# Patient Record
Sex: Male | Born: 1945
Health system: Southern US, Community
[De-identification: ages and names within clinical notes are randomized; demographics above are authoritative.]

## PROBLEM LIST (undated history)

## (undated) DIAGNOSIS — K219 Gastro-esophageal reflux disease without esophagitis: Secondary | ICD-10-CM

## (undated) DIAGNOSIS — Z9889 Other specified postprocedural states: Secondary | ICD-10-CM

## (undated) DIAGNOSIS — F32A Depression, unspecified: Secondary | ICD-10-CM

## (undated) DIAGNOSIS — H9319 Tinnitus, unspecified ear: Secondary | ICD-10-CM

## (undated) DIAGNOSIS — K7460 Unspecified cirrhosis of liver: Secondary | ICD-10-CM

## (undated) DIAGNOSIS — K589 Irritable bowel syndrome without diarrhea: Secondary | ICD-10-CM

## (undated) DIAGNOSIS — N189 Chronic kidney disease, unspecified: Secondary | ICD-10-CM

## (undated) DIAGNOSIS — R188 Other ascites: Secondary | ICD-10-CM

## (undated) DIAGNOSIS — K5730 Diverticulosis of large intestine without perforation or abscess without bleeding: Secondary | ICD-10-CM

## (undated) DIAGNOSIS — R112 Nausea with vomiting, unspecified: Secondary | ICD-10-CM

## (undated) DIAGNOSIS — I10 Essential (primary) hypertension: Secondary | ICD-10-CM

## (undated) DIAGNOSIS — I2510 Atherosclerotic heart disease of native coronary artery without angina pectoris: Secondary | ICD-10-CM

## (undated) DIAGNOSIS — E119 Type 2 diabetes mellitus without complications: Secondary | ICD-10-CM

## (undated) DIAGNOSIS — T8859XA Other complications of anesthesia, initial encounter: Secondary | ICD-10-CM

## (undated) DIAGNOSIS — K625 Hemorrhage of anus and rectum: Secondary | ICD-10-CM

## (undated) DIAGNOSIS — E782 Mixed hyperlipidemia: Secondary | ICD-10-CM

## (undated) DIAGNOSIS — R002 Palpitations: Secondary | ICD-10-CM

## (undated) HISTORY — DX: Essential (primary) hypertension: I10

## (undated) HISTORY — PX: CORONARY ANGIOPLASTY WITH STENT PLACEMENT: SHX49

## (undated) HISTORY — DX: Hemorrhage of anus and rectum: K62.5

## (undated) HISTORY — DX: Diverticulosis of large intestine without perforation or abscess without bleeding: K57.30

## (undated) HISTORY — PX: KNEE SURGERY: SHX244

## (undated) HISTORY — DX: Mixed hyperlipidemia: E78.2

## (undated) HISTORY — DX: Atherosclerotic heart disease of native coronary artery without angina pectoris: I25.10

## (undated) HISTORY — PX: BACK SURGERY: SHX140

## (undated) HISTORY — DX: Type 2 diabetes mellitus without complications: E11.9

## (undated) HISTORY — PX: CHOLECYSTECTOMY: SHX55

## (undated) HISTORY — PX: CARDIAC CATHETERIZATION: SHX172

---

## 2012-01-01 DIAGNOSIS — H1045 Other chronic allergic conjunctivitis: Secondary | ICD-10-CM | POA: Diagnosis not present

## 2012-01-01 DIAGNOSIS — E119 Type 2 diabetes mellitus without complications: Secondary | ICD-10-CM | POA: Diagnosis not present

## 2012-01-01 DIAGNOSIS — H2589 Other age-related cataract: Secondary | ICD-10-CM | POA: Diagnosis not present

## 2012-01-01 DIAGNOSIS — H04129 Dry eye syndrome of unspecified lacrimal gland: Secondary | ICD-10-CM | POA: Diagnosis not present

## 2012-05-06 DIAGNOSIS — R079 Chest pain, unspecified: Secondary | ICD-10-CM | POA: Diagnosis not present

## 2012-05-06 DIAGNOSIS — I2510 Atherosclerotic heart disease of native coronary artery without angina pectoris: Secondary | ICD-10-CM | POA: Diagnosis not present

## 2012-05-06 DIAGNOSIS — I200 Unstable angina: Secondary | ICD-10-CM | POA: Diagnosis not present

## 2012-05-06 DIAGNOSIS — E119 Type 2 diabetes mellitus without complications: Secondary | ICD-10-CM | POA: Diagnosis not present

## 2012-05-06 DIAGNOSIS — R0602 Shortness of breath: Secondary | ICD-10-CM | POA: Diagnosis not present

## 2012-05-06 DIAGNOSIS — Z9089 Acquired absence of other organs: Secondary | ICD-10-CM | POA: Diagnosis not present

## 2012-05-06 DIAGNOSIS — Z79899 Other long term (current) drug therapy: Secondary | ICD-10-CM | POA: Diagnosis not present

## 2012-05-06 DIAGNOSIS — Z7982 Long term (current) use of aspirin: Secondary | ICD-10-CM | POA: Diagnosis not present

## 2012-05-06 DIAGNOSIS — M79609 Pain in unspecified limb: Secondary | ICD-10-CM | POA: Diagnosis not present

## 2012-05-06 DIAGNOSIS — R072 Precordial pain: Secondary | ICD-10-CM | POA: Diagnosis not present

## 2012-05-06 DIAGNOSIS — Z884 Allergy status to anesthetic agent status: Secondary | ICD-10-CM | POA: Diagnosis not present

## 2012-05-06 DIAGNOSIS — J45909 Unspecified asthma, uncomplicated: Secondary | ICD-10-CM | POA: Diagnosis present

## 2012-05-06 DIAGNOSIS — I10 Essential (primary) hypertension: Secondary | ICD-10-CM | POA: Diagnosis not present

## 2012-05-06 DIAGNOSIS — I519 Heart disease, unspecified: Secondary | ICD-10-CM | POA: Diagnosis not present

## 2012-07-20 DIAGNOSIS — Z23 Encounter for immunization: Secondary | ICD-10-CM | POA: Diagnosis not present

## 2012-07-29 DIAGNOSIS — I10 Essential (primary) hypertension: Secondary | ICD-10-CM | POA: Diagnosis not present

## 2012-07-29 DIAGNOSIS — Z79899 Other long term (current) drug therapy: Secondary | ICD-10-CM | POA: Diagnosis not present

## 2012-07-29 DIAGNOSIS — Z125 Encounter for screening for malignant neoplasm of prostate: Secondary | ICD-10-CM | POA: Diagnosis not present

## 2012-07-29 DIAGNOSIS — K219 Gastro-esophageal reflux disease without esophagitis: Secondary | ICD-10-CM | POA: Diagnosis not present

## 2012-07-29 DIAGNOSIS — E119 Type 2 diabetes mellitus without complications: Secondary | ICD-10-CM | POA: Diagnosis not present

## 2012-08-13 DIAGNOSIS — E119 Type 2 diabetes mellitus without complications: Secondary | ICD-10-CM | POA: Diagnosis not present

## 2012-08-13 DIAGNOSIS — R079 Chest pain, unspecified: Secondary | ICD-10-CM | POA: Diagnosis not present

## 2012-08-13 DIAGNOSIS — I2510 Atherosclerotic heart disease of native coronary artery without angina pectoris: Secondary | ICD-10-CM | POA: Diagnosis not present

## 2012-08-13 DIAGNOSIS — I200 Unstable angina: Secondary | ICD-10-CM | POA: Diagnosis not present

## 2012-08-13 DIAGNOSIS — I10 Essential (primary) hypertension: Secondary | ICD-10-CM | POA: Diagnosis not present

## 2012-08-13 DIAGNOSIS — I209 Angina pectoris, unspecified: Secondary | ICD-10-CM | POA: Diagnosis not present

## 2012-08-13 DIAGNOSIS — K222 Esophageal obstruction: Secondary | ICD-10-CM | POA: Diagnosis not present

## 2012-08-23 DIAGNOSIS — I2510 Atherosclerotic heart disease of native coronary artery without angina pectoris: Secondary | ICD-10-CM | POA: Diagnosis not present

## 2013-04-20 DIAGNOSIS — E119 Type 2 diabetes mellitus without complications: Secondary | ICD-10-CM | POA: Diagnosis not present

## 2013-05-27 DIAGNOSIS — M531 Cervicobrachial syndrome: Secondary | ICD-10-CM | POA: Diagnosis not present

## 2013-05-27 DIAGNOSIS — M999 Biomechanical lesion, unspecified: Secondary | ICD-10-CM | POA: Diagnosis not present

## 2013-05-27 DIAGNOSIS — M9981 Other biomechanical lesions of cervical region: Secondary | ICD-10-CM | POA: Diagnosis not present

## 2013-05-30 DIAGNOSIS — M999 Biomechanical lesion, unspecified: Secondary | ICD-10-CM | POA: Diagnosis not present

## 2013-05-30 DIAGNOSIS — M531 Cervicobrachial syndrome: Secondary | ICD-10-CM | POA: Diagnosis not present

## 2013-05-30 DIAGNOSIS — M9981 Other biomechanical lesions of cervical region: Secondary | ICD-10-CM | POA: Diagnosis not present

## 2013-06-01 DIAGNOSIS — M999 Biomechanical lesion, unspecified: Secondary | ICD-10-CM | POA: Diagnosis not present

## 2013-06-01 DIAGNOSIS — M9981 Other biomechanical lesions of cervical region: Secondary | ICD-10-CM | POA: Diagnosis not present

## 2013-06-01 DIAGNOSIS — M531 Cervicobrachial syndrome: Secondary | ICD-10-CM | POA: Diagnosis not present

## 2013-06-03 DIAGNOSIS — M9981 Other biomechanical lesions of cervical region: Secondary | ICD-10-CM | POA: Diagnosis not present

## 2013-06-03 DIAGNOSIS — M531 Cervicobrachial syndrome: Secondary | ICD-10-CM | POA: Diagnosis not present

## 2013-06-03 DIAGNOSIS — M999 Biomechanical lesion, unspecified: Secondary | ICD-10-CM | POA: Diagnosis not present

## 2013-06-08 DIAGNOSIS — M999 Biomechanical lesion, unspecified: Secondary | ICD-10-CM | POA: Diagnosis not present

## 2013-06-08 DIAGNOSIS — M531 Cervicobrachial syndrome: Secondary | ICD-10-CM | POA: Diagnosis not present

## 2013-06-08 DIAGNOSIS — M9981 Other biomechanical lesions of cervical region: Secondary | ICD-10-CM | POA: Diagnosis not present

## 2013-07-05 DIAGNOSIS — I2510 Atherosclerotic heart disease of native coronary artery without angina pectoris: Secondary | ICD-10-CM | POA: Diagnosis not present

## 2013-07-05 DIAGNOSIS — E119 Type 2 diabetes mellitus without complications: Secondary | ICD-10-CM | POA: Diagnosis not present

## 2013-07-05 DIAGNOSIS — I10 Essential (primary) hypertension: Secondary | ICD-10-CM | POA: Diagnosis not present

## 2013-07-05 DIAGNOSIS — E785 Hyperlipidemia, unspecified: Secondary | ICD-10-CM | POA: Diagnosis not present

## 2013-07-22 DIAGNOSIS — R079 Chest pain, unspecified: Secondary | ICD-10-CM | POA: Diagnosis not present

## 2013-07-22 DIAGNOSIS — I2510 Atherosclerotic heart disease of native coronary artery without angina pectoris: Secondary | ICD-10-CM | POA: Diagnosis not present

## 2013-08-08 DIAGNOSIS — E119 Type 2 diabetes mellitus without complications: Secondary | ICD-10-CM | POA: Diagnosis not present

## 2013-08-08 DIAGNOSIS — I10 Essential (primary) hypertension: Secondary | ICD-10-CM | POA: Diagnosis not present

## 2013-08-08 DIAGNOSIS — Z125 Encounter for screening for malignant neoplasm of prostate: Secondary | ICD-10-CM | POA: Diagnosis not present

## 2013-08-08 DIAGNOSIS — Z79899 Other long term (current) drug therapy: Secondary | ICD-10-CM | POA: Diagnosis not present

## 2013-08-08 DIAGNOSIS — Z23 Encounter for immunization: Secondary | ICD-10-CM | POA: Diagnosis not present

## 2013-08-08 DIAGNOSIS — Z0000 Encounter for general adult medical examination without abnormal findings: Secondary | ICD-10-CM | POA: Diagnosis not present

## 2013-08-08 DIAGNOSIS — I2510 Atherosclerotic heart disease of native coronary artery without angina pectoris: Secondary | ICD-10-CM | POA: Diagnosis not present

## 2013-08-08 DIAGNOSIS — E782 Mixed hyperlipidemia: Secondary | ICD-10-CM | POA: Diagnosis not present

## 2013-08-10 DIAGNOSIS — I2510 Atherosclerotic heart disease of native coronary artery without angina pectoris: Secondary | ICD-10-CM | POA: Diagnosis not present

## 2013-09-14 DIAGNOSIS — E119 Type 2 diabetes mellitus without complications: Secondary | ICD-10-CM | POA: Diagnosis not present

## 2013-09-14 DIAGNOSIS — I10 Essential (primary) hypertension: Secondary | ICD-10-CM | POA: Diagnosis not present

## 2013-09-14 DIAGNOSIS — E785 Hyperlipidemia, unspecified: Secondary | ICD-10-CM | POA: Diagnosis not present

## 2013-09-14 DIAGNOSIS — I2510 Atherosclerotic heart disease of native coronary artery without angina pectoris: Secondary | ICD-10-CM | POA: Diagnosis not present

## 2013-09-30 DIAGNOSIS — E782 Mixed hyperlipidemia: Secondary | ICD-10-CM | POA: Diagnosis not present

## 2013-09-30 DIAGNOSIS — IMO0001 Reserved for inherently not codable concepts without codable children: Secondary | ICD-10-CM | POA: Diagnosis not present

## 2013-10-04 DIAGNOSIS — E782 Mixed hyperlipidemia: Secondary | ICD-10-CM | POA: Diagnosis not present

## 2013-10-04 DIAGNOSIS — I2510 Atherosclerotic heart disease of native coronary artery without angina pectoris: Secondary | ICD-10-CM | POA: Diagnosis not present

## 2013-10-04 DIAGNOSIS — Z7902 Long term (current) use of antithrombotics/antiplatelets: Secondary | ICD-10-CM | POA: Diagnosis not present

## 2013-10-04 DIAGNOSIS — Z006 Encounter for examination for normal comparison and control in clinical research program: Secondary | ICD-10-CM | POA: Diagnosis not present

## 2013-10-13 DIAGNOSIS — E785 Hyperlipidemia, unspecified: Secondary | ICD-10-CM | POA: Diagnosis not present

## 2013-10-19 DIAGNOSIS — J0100 Acute maxillary sinusitis, unspecified: Secondary | ICD-10-CM | POA: Diagnosis not present

## 2014-03-22 DIAGNOSIS — E119 Type 2 diabetes mellitus without complications: Secondary | ICD-10-CM | POA: Diagnosis not present

## 2014-03-22 DIAGNOSIS — E785 Hyperlipidemia, unspecified: Secondary | ICD-10-CM | POA: Diagnosis not present

## 2014-03-22 DIAGNOSIS — I2510 Atherosclerotic heart disease of native coronary artery without angina pectoris: Secondary | ICD-10-CM | POA: Diagnosis not present

## 2014-03-22 DIAGNOSIS — I10 Essential (primary) hypertension: Secondary | ICD-10-CM | POA: Diagnosis not present

## 2014-06-07 DIAGNOSIS — E119 Type 2 diabetes mellitus without complications: Secondary | ICD-10-CM | POA: Diagnosis not present

## 2014-07-11 DIAGNOSIS — Z23 Encounter for immunization: Secondary | ICD-10-CM | POA: Diagnosis not present

## 2014-08-09 DIAGNOSIS — I2510 Atherosclerotic heart disease of native coronary artery without angina pectoris: Secondary | ICD-10-CM | POA: Diagnosis not present

## 2014-08-09 DIAGNOSIS — E785 Hyperlipidemia, unspecified: Secondary | ICD-10-CM | POA: Diagnosis not present

## 2014-08-09 DIAGNOSIS — Z1389 Encounter for screening for other disorder: Secondary | ICD-10-CM | POA: Diagnosis not present

## 2014-08-09 DIAGNOSIS — K219 Gastro-esophageal reflux disease without esophagitis: Secondary | ICD-10-CM | POA: Diagnosis not present

## 2014-08-09 DIAGNOSIS — E1159 Type 2 diabetes mellitus with other circulatory complications: Secondary | ICD-10-CM | POA: Diagnosis not present

## 2014-08-09 DIAGNOSIS — Z9181 History of falling: Secondary | ICD-10-CM | POA: Diagnosis not present

## 2014-08-09 DIAGNOSIS — J309 Allergic rhinitis, unspecified: Secondary | ICD-10-CM | POA: Diagnosis not present

## 2014-08-09 DIAGNOSIS — I10 Essential (primary) hypertension: Secondary | ICD-10-CM | POA: Diagnosis not present

## 2014-08-09 DIAGNOSIS — R5382 Chronic fatigue, unspecified: Secondary | ICD-10-CM | POA: Diagnosis not present

## 2014-08-25 DIAGNOSIS — R945 Abnormal results of liver function studies: Secondary | ICD-10-CM | POA: Diagnosis not present

## 2014-08-25 DIAGNOSIS — K219 Gastro-esophageal reflux disease without esophagitis: Secondary | ICD-10-CM | POA: Diagnosis not present

## 2014-08-25 DIAGNOSIS — E785 Hyperlipidemia, unspecified: Secondary | ICD-10-CM | POA: Diagnosis not present

## 2014-08-25 DIAGNOSIS — J309 Allergic rhinitis, unspecified: Secondary | ICD-10-CM | POA: Diagnosis not present

## 2014-08-25 DIAGNOSIS — N183 Chronic kidney disease, stage 3 (moderate): Secondary | ICD-10-CM | POA: Diagnosis not present

## 2014-08-25 DIAGNOSIS — E669 Obesity, unspecified: Secondary | ICD-10-CM | POA: Diagnosis not present

## 2014-08-25 DIAGNOSIS — E1159 Type 2 diabetes mellitus with other circulatory complications: Secondary | ICD-10-CM | POA: Diagnosis not present

## 2014-08-25 DIAGNOSIS — I10 Essential (primary) hypertension: Secondary | ICD-10-CM | POA: Diagnosis not present

## 2014-08-25 DIAGNOSIS — I2510 Atherosclerotic heart disease of native coronary artery without angina pectoris: Secondary | ICD-10-CM | POA: Diagnosis not present

## 2014-10-20 DIAGNOSIS — R945 Abnormal results of liver function studies: Secondary | ICD-10-CM | POA: Diagnosis not present

## 2014-10-20 DIAGNOSIS — J309 Allergic rhinitis, unspecified: Secondary | ICD-10-CM | POA: Diagnosis not present

## 2014-10-20 DIAGNOSIS — K219 Gastro-esophageal reflux disease without esophagitis: Secondary | ICD-10-CM | POA: Diagnosis not present

## 2014-10-20 DIAGNOSIS — E785 Hyperlipidemia, unspecified: Secondary | ICD-10-CM | POA: Diagnosis not present

## 2014-10-20 DIAGNOSIS — E1159 Type 2 diabetes mellitus with other circulatory complications: Secondary | ICD-10-CM | POA: Diagnosis not present

## 2014-10-20 DIAGNOSIS — I2510 Atherosclerotic heart disease of native coronary artery without angina pectoris: Secondary | ICD-10-CM | POA: Diagnosis not present

## 2014-10-20 DIAGNOSIS — I10 Essential (primary) hypertension: Secondary | ICD-10-CM | POA: Diagnosis not present

## 2014-10-20 DIAGNOSIS — N183 Chronic kidney disease, stage 3 (moderate): Secondary | ICD-10-CM | POA: Diagnosis not present

## 2014-11-07 DIAGNOSIS — J189 Pneumonia, unspecified organism: Secondary | ICD-10-CM | POA: Diagnosis not present

## 2015-04-26 DIAGNOSIS — R945 Abnormal results of liver function studies: Secondary | ICD-10-CM | POA: Diagnosis not present

## 2015-04-26 DIAGNOSIS — E785 Hyperlipidemia, unspecified: Secondary | ICD-10-CM | POA: Diagnosis not present

## 2015-04-26 DIAGNOSIS — E669 Obesity, unspecified: Secondary | ICD-10-CM | POA: Diagnosis not present

## 2015-04-26 DIAGNOSIS — K219 Gastro-esophageal reflux disease without esophagitis: Secondary | ICD-10-CM | POA: Diagnosis not present

## 2015-04-26 DIAGNOSIS — I2510 Atherosclerotic heart disease of native coronary artery without angina pectoris: Secondary | ICD-10-CM | POA: Diagnosis not present

## 2015-04-26 DIAGNOSIS — Z6831 Body mass index (BMI) 31.0-31.9, adult: Secondary | ICD-10-CM | POA: Diagnosis not present

## 2015-04-26 DIAGNOSIS — N183 Chronic kidney disease, stage 3 (moderate): Secondary | ICD-10-CM | POA: Diagnosis not present

## 2015-04-26 DIAGNOSIS — I10 Essential (primary) hypertension: Secondary | ICD-10-CM | POA: Diagnosis not present

## 2015-04-26 DIAGNOSIS — J309 Allergic rhinitis, unspecified: Secondary | ICD-10-CM | POA: Diagnosis not present

## 2015-04-26 DIAGNOSIS — E1159 Type 2 diabetes mellitus with other circulatory complications: Secondary | ICD-10-CM | POA: Diagnosis not present

## 2015-04-26 DIAGNOSIS — Z1389 Encounter for screening for other disorder: Secondary | ICD-10-CM | POA: Diagnosis not present

## 2015-05-11 DIAGNOSIS — K219 Gastro-esophageal reflux disease without esophagitis: Secondary | ICD-10-CM | POA: Diagnosis not present

## 2015-05-11 DIAGNOSIS — Z6831 Body mass index (BMI) 31.0-31.9, adult: Secondary | ICD-10-CM | POA: Diagnosis not present

## 2015-05-11 DIAGNOSIS — I10 Essential (primary) hypertension: Secondary | ICD-10-CM | POA: Diagnosis not present

## 2015-05-11 DIAGNOSIS — E669 Obesity, unspecified: Secondary | ICD-10-CM | POA: Diagnosis not present

## 2015-05-11 DIAGNOSIS — I2510 Atherosclerotic heart disease of native coronary artery without angina pectoris: Secondary | ICD-10-CM | POA: Diagnosis not present

## 2015-05-11 DIAGNOSIS — E1159 Type 2 diabetes mellitus with other circulatory complications: Secondary | ICD-10-CM | POA: Diagnosis not present

## 2015-05-11 DIAGNOSIS — R945 Abnormal results of liver function studies: Secondary | ICD-10-CM | POA: Diagnosis not present

## 2015-05-11 DIAGNOSIS — Z23 Encounter for immunization: Secondary | ICD-10-CM | POA: Diagnosis not present

## 2015-05-11 DIAGNOSIS — N183 Chronic kidney disease, stage 3 (moderate): Secondary | ICD-10-CM | POA: Diagnosis not present

## 2015-05-11 DIAGNOSIS — E785 Hyperlipidemia, unspecified: Secondary | ICD-10-CM | POA: Diagnosis not present

## 2015-05-11 DIAGNOSIS — J309 Allergic rhinitis, unspecified: Secondary | ICD-10-CM | POA: Diagnosis not present

## 2015-05-25 DIAGNOSIS — R945 Abnormal results of liver function studies: Secondary | ICD-10-CM | POA: Diagnosis not present

## 2015-05-25 DIAGNOSIS — E669 Obesity, unspecified: Secondary | ICD-10-CM | POA: Diagnosis not present

## 2015-05-25 DIAGNOSIS — I2510 Atherosclerotic heart disease of native coronary artery without angina pectoris: Secondary | ICD-10-CM | POA: Diagnosis not present

## 2015-05-25 DIAGNOSIS — Z6831 Body mass index (BMI) 31.0-31.9, adult: Secondary | ICD-10-CM | POA: Diagnosis not present

## 2015-05-25 DIAGNOSIS — E1159 Type 2 diabetes mellitus with other circulatory complications: Secondary | ICD-10-CM | POA: Diagnosis not present

## 2015-05-25 DIAGNOSIS — J309 Allergic rhinitis, unspecified: Secondary | ICD-10-CM | POA: Diagnosis not present

## 2015-05-25 DIAGNOSIS — K219 Gastro-esophageal reflux disease without esophagitis: Secondary | ICD-10-CM | POA: Diagnosis not present

## 2015-05-25 DIAGNOSIS — E785 Hyperlipidemia, unspecified: Secondary | ICD-10-CM | POA: Diagnosis not present

## 2015-05-25 DIAGNOSIS — I10 Essential (primary) hypertension: Secondary | ICD-10-CM | POA: Diagnosis not present

## 2015-05-25 DIAGNOSIS — N183 Chronic kidney disease, stage 3 (moderate): Secondary | ICD-10-CM | POA: Diagnosis not present

## 2015-05-29 DIAGNOSIS — I10 Essential (primary) hypertension: Secondary | ICD-10-CM

## 2015-05-29 DIAGNOSIS — E119 Type 2 diabetes mellitus without complications: Secondary | ICD-10-CM

## 2015-05-29 DIAGNOSIS — I2510 Atherosclerotic heart disease of native coronary artery without angina pectoris: Secondary | ICD-10-CM

## 2015-05-29 DIAGNOSIS — E782 Mixed hyperlipidemia: Secondary | ICD-10-CM

## 2015-05-29 HISTORY — DX: Type 2 diabetes mellitus without complications: E11.9

## 2015-05-29 HISTORY — DX: Mixed hyperlipidemia: E78.2

## 2015-05-29 HISTORY — DX: Essential (primary) hypertension: I10

## 2015-05-29 HISTORY — DX: Atherosclerotic heart disease of native coronary artery without angina pectoris: I25.10

## 2015-07-31 DIAGNOSIS — Z6832 Body mass index (BMI) 32.0-32.9, adult: Secondary | ICD-10-CM | POA: Diagnosis not present

## 2015-07-31 DIAGNOSIS — J309 Allergic rhinitis, unspecified: Secondary | ICD-10-CM | POA: Diagnosis not present

## 2015-07-31 DIAGNOSIS — E669 Obesity, unspecified: Secondary | ICD-10-CM | POA: Diagnosis not present

## 2015-07-31 DIAGNOSIS — M545 Low back pain: Secondary | ICD-10-CM | POA: Diagnosis not present

## 2015-07-31 DIAGNOSIS — E785 Hyperlipidemia, unspecified: Secondary | ICD-10-CM | POA: Diagnosis not present

## 2015-07-31 DIAGNOSIS — I2510 Atherosclerotic heart disease of native coronary artery without angina pectoris: Secondary | ICD-10-CM | POA: Diagnosis not present

## 2015-07-31 DIAGNOSIS — N183 Chronic kidney disease, stage 3 (moderate): Secondary | ICD-10-CM | POA: Diagnosis not present

## 2015-07-31 DIAGNOSIS — R945 Abnormal results of liver function studies: Secondary | ICD-10-CM | POA: Diagnosis not present

## 2015-07-31 DIAGNOSIS — E1159 Type 2 diabetes mellitus with other circulatory complications: Secondary | ICD-10-CM | POA: Diagnosis not present

## 2015-07-31 DIAGNOSIS — I10 Essential (primary) hypertension: Secondary | ICD-10-CM | POA: Diagnosis not present

## 2015-07-31 DIAGNOSIS — K219 Gastro-esophageal reflux disease without esophagitis: Secondary | ICD-10-CM | POA: Diagnosis not present

## 2015-07-31 DIAGNOSIS — Z23 Encounter for immunization: Secondary | ICD-10-CM | POA: Diagnosis not present

## 2015-11-07 DIAGNOSIS — E1159 Type 2 diabetes mellitus with other circulatory complications: Secondary | ICD-10-CM | POA: Diagnosis not present

## 2015-11-07 DIAGNOSIS — N183 Chronic kidney disease, stage 3 (moderate): Secondary | ICD-10-CM | POA: Diagnosis not present

## 2015-11-07 DIAGNOSIS — M545 Low back pain: Secondary | ICD-10-CM | POA: Diagnosis not present

## 2015-11-07 DIAGNOSIS — I10 Essential (primary) hypertension: Secondary | ICD-10-CM | POA: Diagnosis not present

## 2015-11-07 DIAGNOSIS — K219 Gastro-esophageal reflux disease without esophagitis: Secondary | ICD-10-CM | POA: Diagnosis not present

## 2015-11-07 DIAGNOSIS — Z6832 Body mass index (BMI) 32.0-32.9, adult: Secondary | ICD-10-CM | POA: Diagnosis not present

## 2015-11-07 DIAGNOSIS — J309 Allergic rhinitis, unspecified: Secondary | ICD-10-CM | POA: Diagnosis not present

## 2015-11-07 DIAGNOSIS — I2510 Atherosclerotic heart disease of native coronary artery without angina pectoris: Secondary | ICD-10-CM | POA: Diagnosis not present

## 2015-11-07 DIAGNOSIS — E785 Hyperlipidemia, unspecified: Secondary | ICD-10-CM | POA: Diagnosis not present

## 2015-11-07 DIAGNOSIS — R945 Abnormal results of liver function studies: Secondary | ICD-10-CM | POA: Diagnosis not present

## 2015-11-07 DIAGNOSIS — E669 Obesity, unspecified: Secondary | ICD-10-CM | POA: Diagnosis not present

## 2016-01-16 DIAGNOSIS — K219 Gastro-esophageal reflux disease without esophagitis: Secondary | ICD-10-CM | POA: Diagnosis not present

## 2016-01-16 DIAGNOSIS — K5730 Diverticulosis of large intestine without perforation or abscess without bleeding: Secondary | ICD-10-CM | POA: Diagnosis not present

## 2016-01-16 DIAGNOSIS — K222 Esophageal obstruction: Secondary | ICD-10-CM | POA: Diagnosis not present

## 2016-01-16 DIAGNOSIS — R1310 Dysphagia, unspecified: Secondary | ICD-10-CM | POA: Diagnosis not present

## 2016-01-18 DIAGNOSIS — H81399 Other peripheral vertigo, unspecified ear: Secondary | ICD-10-CM | POA: Diagnosis not present

## 2016-01-21 DIAGNOSIS — E785 Hyperlipidemia, unspecified: Secondary | ICD-10-CM | POA: Diagnosis not present

## 2016-01-21 DIAGNOSIS — E1159 Type 2 diabetes mellitus with other circulatory complications: Secondary | ICD-10-CM | POA: Diagnosis not present

## 2016-01-21 DIAGNOSIS — J309 Allergic rhinitis, unspecified: Secondary | ICD-10-CM | POA: Diagnosis not present

## 2016-01-21 DIAGNOSIS — R945 Abnormal results of liver function studies: Secondary | ICD-10-CM | POA: Diagnosis not present

## 2016-01-21 DIAGNOSIS — E669 Obesity, unspecified: Secondary | ICD-10-CM | POA: Diagnosis not present

## 2016-01-21 DIAGNOSIS — I2510 Atherosclerotic heart disease of native coronary artery without angina pectoris: Secondary | ICD-10-CM | POA: Diagnosis not present

## 2016-01-21 DIAGNOSIS — Z6832 Body mass index (BMI) 32.0-32.9, adult: Secondary | ICD-10-CM | POA: Diagnosis not present

## 2016-01-21 DIAGNOSIS — M545 Low back pain: Secondary | ICD-10-CM | POA: Diagnosis not present

## 2016-01-21 DIAGNOSIS — J0101 Acute recurrent maxillary sinusitis: Secondary | ICD-10-CM | POA: Diagnosis not present

## 2016-01-21 DIAGNOSIS — K219 Gastro-esophageal reflux disease without esophagitis: Secondary | ICD-10-CM | POA: Diagnosis not present

## 2016-01-21 DIAGNOSIS — N183 Chronic kidney disease, stage 3 (moderate): Secondary | ICD-10-CM | POA: Diagnosis not present

## 2016-01-21 DIAGNOSIS — I10 Essential (primary) hypertension: Secondary | ICD-10-CM | POA: Diagnosis not present

## 2016-02-07 DIAGNOSIS — E1159 Type 2 diabetes mellitus with other circulatory complications: Secondary | ICD-10-CM | POA: Diagnosis not present

## 2016-02-07 DIAGNOSIS — K219 Gastro-esophageal reflux disease without esophagitis: Secondary | ICD-10-CM | POA: Diagnosis not present

## 2016-02-07 DIAGNOSIS — R05 Cough: Secondary | ICD-10-CM | POA: Diagnosis not present

## 2016-02-07 DIAGNOSIS — N183 Chronic kidney disease, stage 3 (moderate): Secondary | ICD-10-CM | POA: Diagnosis not present

## 2016-02-07 DIAGNOSIS — R509 Fever, unspecified: Secondary | ICD-10-CM | POA: Diagnosis not present

## 2016-02-07 DIAGNOSIS — J208 Acute bronchitis due to other specified organisms: Secondary | ICD-10-CM | POA: Diagnosis not present

## 2016-02-07 DIAGNOSIS — M545 Low back pain: Secondary | ICD-10-CM | POA: Diagnosis not present

## 2016-02-07 DIAGNOSIS — R0602 Shortness of breath: Secondary | ICD-10-CM | POA: Diagnosis not present

## 2016-02-07 DIAGNOSIS — I10 Essential (primary) hypertension: Secondary | ICD-10-CM | POA: Diagnosis not present

## 2016-02-07 DIAGNOSIS — E669 Obesity, unspecified: Secondary | ICD-10-CM | POA: Diagnosis not present

## 2016-02-07 DIAGNOSIS — Z6831 Body mass index (BMI) 31.0-31.9, adult: Secondary | ICD-10-CM | POA: Diagnosis not present

## 2016-02-07 DIAGNOSIS — I2510 Atherosclerotic heart disease of native coronary artery without angina pectoris: Secondary | ICD-10-CM | POA: Diagnosis not present

## 2016-02-07 DIAGNOSIS — J309 Allergic rhinitis, unspecified: Secondary | ICD-10-CM | POA: Diagnosis not present

## 2016-02-07 DIAGNOSIS — R945 Abnormal results of liver function studies: Secondary | ICD-10-CM | POA: Diagnosis not present

## 2016-02-07 DIAGNOSIS — E785 Hyperlipidemia, unspecified: Secondary | ICD-10-CM | POA: Diagnosis not present

## 2016-02-14 DIAGNOSIS — M545 Low back pain: Secondary | ICD-10-CM | POA: Diagnosis not present

## 2016-02-14 DIAGNOSIS — I2510 Atherosclerotic heart disease of native coronary artery without angina pectoris: Secondary | ICD-10-CM | POA: Diagnosis not present

## 2016-02-14 DIAGNOSIS — E785 Hyperlipidemia, unspecified: Secondary | ICD-10-CM | POA: Diagnosis not present

## 2016-02-14 DIAGNOSIS — E1159 Type 2 diabetes mellitus with other circulatory complications: Secondary | ICD-10-CM | POA: Diagnosis not present

## 2016-02-14 DIAGNOSIS — I10 Essential (primary) hypertension: Secondary | ICD-10-CM | POA: Diagnosis not present

## 2016-02-14 DIAGNOSIS — J309 Allergic rhinitis, unspecified: Secondary | ICD-10-CM | POA: Diagnosis not present

## 2016-02-14 DIAGNOSIS — Z6830 Body mass index (BMI) 30.0-30.9, adult: Secondary | ICD-10-CM | POA: Diagnosis not present

## 2016-02-14 DIAGNOSIS — N183 Chronic kidney disease, stage 3 (moderate): Secondary | ICD-10-CM | POA: Diagnosis not present

## 2016-02-14 DIAGNOSIS — E669 Obesity, unspecified: Secondary | ICD-10-CM | POA: Diagnosis not present

## 2016-02-14 DIAGNOSIS — K219 Gastro-esophageal reflux disease without esophagitis: Secondary | ICD-10-CM | POA: Diagnosis not present

## 2016-02-14 DIAGNOSIS — R945 Abnormal results of liver function studies: Secondary | ICD-10-CM | POA: Diagnosis not present

## 2016-02-28 DIAGNOSIS — Z86010 Personal history of colonic polyps: Secondary | ICD-10-CM | POA: Diagnosis not present

## 2016-02-28 DIAGNOSIS — Z8619 Personal history of other infectious and parasitic diseases: Secondary | ICD-10-CM | POA: Diagnosis not present

## 2016-02-28 DIAGNOSIS — K449 Diaphragmatic hernia without obstruction or gangrene: Secondary | ICD-10-CM | POA: Diagnosis not present

## 2016-02-28 DIAGNOSIS — K2270 Barrett's esophagus without dysplasia: Secondary | ICD-10-CM | POA: Diagnosis not present

## 2016-02-28 DIAGNOSIS — I10 Essential (primary) hypertension: Secondary | ICD-10-CM | POA: Diagnosis not present

## 2016-02-28 DIAGNOSIS — K2970 Gastritis, unspecified, without bleeding: Secondary | ICD-10-CM | POA: Diagnosis not present

## 2016-02-28 DIAGNOSIS — K219 Gastro-esophageal reflux disease without esophagitis: Secondary | ICD-10-CM | POA: Diagnosis not present

## 2016-02-28 DIAGNOSIS — D126 Benign neoplasm of colon, unspecified: Secondary | ICD-10-CM | POA: Diagnosis not present

## 2016-02-28 DIAGNOSIS — Z9049 Acquired absence of other specified parts of digestive tract: Secondary | ICD-10-CM | POA: Diagnosis not present

## 2016-02-28 DIAGNOSIS — K648 Other hemorrhoids: Secondary | ICD-10-CM | POA: Diagnosis not present

## 2016-02-28 DIAGNOSIS — Z1211 Encounter for screening for malignant neoplasm of colon: Secondary | ICD-10-CM | POA: Diagnosis not present

## 2016-02-28 DIAGNOSIS — Z79899 Other long term (current) drug therapy: Secondary | ICD-10-CM | POA: Diagnosis not present

## 2016-02-28 DIAGNOSIS — K5730 Diverticulosis of large intestine without perforation or abscess without bleeding: Secondary | ICD-10-CM | POA: Diagnosis not present

## 2016-02-28 DIAGNOSIS — K222 Esophageal obstruction: Secondary | ICD-10-CM | POA: Diagnosis not present

## 2016-02-28 DIAGNOSIS — D122 Benign neoplasm of ascending colon: Secondary | ICD-10-CM | POA: Diagnosis not present

## 2016-02-28 DIAGNOSIS — Z7984 Long term (current) use of oral hypoglycemic drugs: Secondary | ICD-10-CM | POA: Diagnosis not present

## 2016-02-28 DIAGNOSIS — E119 Type 2 diabetes mellitus without complications: Secondary | ICD-10-CM | POA: Diagnosis not present

## 2016-02-28 DIAGNOSIS — Z955 Presence of coronary angioplasty implant and graft: Secondary | ICD-10-CM | POA: Diagnosis not present

## 2016-03-04 DIAGNOSIS — J309 Allergic rhinitis, unspecified: Secondary | ICD-10-CM | POA: Diagnosis not present

## 2016-03-04 DIAGNOSIS — E669 Obesity, unspecified: Secondary | ICD-10-CM | POA: Diagnosis not present

## 2016-03-04 DIAGNOSIS — I10 Essential (primary) hypertension: Secondary | ICD-10-CM | POA: Diagnosis not present

## 2016-03-04 DIAGNOSIS — K219 Gastro-esophageal reflux disease without esophagitis: Secondary | ICD-10-CM | POA: Diagnosis not present

## 2016-03-04 DIAGNOSIS — E785 Hyperlipidemia, unspecified: Secondary | ICD-10-CM | POA: Diagnosis not present

## 2016-03-04 DIAGNOSIS — Z6830 Body mass index (BMI) 30.0-30.9, adult: Secondary | ICD-10-CM | POA: Diagnosis not present

## 2016-03-04 DIAGNOSIS — E1159 Type 2 diabetes mellitus with other circulatory complications: Secondary | ICD-10-CM | POA: Diagnosis not present

## 2016-03-04 DIAGNOSIS — M545 Low back pain: Secondary | ICD-10-CM | POA: Diagnosis not present

## 2016-03-04 DIAGNOSIS — I2510 Atherosclerotic heart disease of native coronary artery without angina pectoris: Secondary | ICD-10-CM | POA: Diagnosis not present

## 2016-03-04 DIAGNOSIS — N183 Chronic kidney disease, stage 3 (moderate): Secondary | ICD-10-CM | POA: Diagnosis not present

## 2016-03-04 DIAGNOSIS — R945 Abnormal results of liver function studies: Secondary | ICD-10-CM | POA: Diagnosis not present

## 2016-03-18 DIAGNOSIS — Z6830 Body mass index (BMI) 30.0-30.9, adult: Secondary | ICD-10-CM | POA: Diagnosis not present

## 2016-03-18 DIAGNOSIS — E785 Hyperlipidemia, unspecified: Secondary | ICD-10-CM | POA: Diagnosis not present

## 2016-03-18 DIAGNOSIS — K219 Gastro-esophageal reflux disease without esophagitis: Secondary | ICD-10-CM | POA: Diagnosis not present

## 2016-03-18 DIAGNOSIS — I2510 Atherosclerotic heart disease of native coronary artery without angina pectoris: Secondary | ICD-10-CM | POA: Diagnosis not present

## 2016-03-18 DIAGNOSIS — I10 Essential (primary) hypertension: Secondary | ICD-10-CM | POA: Diagnosis not present

## 2016-03-18 DIAGNOSIS — N183 Chronic kidney disease, stage 3 (moderate): Secondary | ICD-10-CM | POA: Diagnosis not present

## 2016-03-18 DIAGNOSIS — R945 Abnormal results of liver function studies: Secondary | ICD-10-CM | POA: Diagnosis not present

## 2016-03-18 DIAGNOSIS — M545 Low back pain: Secondary | ICD-10-CM | POA: Diagnosis not present

## 2016-03-18 DIAGNOSIS — J309 Allergic rhinitis, unspecified: Secondary | ICD-10-CM | POA: Diagnosis not present

## 2016-03-18 DIAGNOSIS — E1159 Type 2 diabetes mellitus with other circulatory complications: Secondary | ICD-10-CM | POA: Diagnosis not present

## 2016-03-18 DIAGNOSIS — E669 Obesity, unspecified: Secondary | ICD-10-CM | POA: Diagnosis not present

## 2016-04-01 DIAGNOSIS — N183 Chronic kidney disease, stage 3 (moderate): Secondary | ICD-10-CM | POA: Diagnosis not present

## 2016-04-01 DIAGNOSIS — Z6830 Body mass index (BMI) 30.0-30.9, adult: Secondary | ICD-10-CM | POA: Diagnosis not present

## 2016-04-01 DIAGNOSIS — M545 Low back pain: Secondary | ICD-10-CM | POA: Diagnosis not present

## 2016-04-01 DIAGNOSIS — E1159 Type 2 diabetes mellitus with other circulatory complications: Secondary | ICD-10-CM | POA: Diagnosis not present

## 2016-04-01 DIAGNOSIS — E785 Hyperlipidemia, unspecified: Secondary | ICD-10-CM | POA: Diagnosis not present

## 2016-04-01 DIAGNOSIS — E669 Obesity, unspecified: Secondary | ICD-10-CM | POA: Diagnosis not present

## 2016-04-01 DIAGNOSIS — R945 Abnormal results of liver function studies: Secondary | ICD-10-CM | POA: Diagnosis not present

## 2016-04-01 DIAGNOSIS — I10 Essential (primary) hypertension: Secondary | ICD-10-CM | POA: Diagnosis not present

## 2016-04-01 DIAGNOSIS — J309 Allergic rhinitis, unspecified: Secondary | ICD-10-CM | POA: Diagnosis not present

## 2016-04-01 DIAGNOSIS — K219 Gastro-esophageal reflux disease without esophagitis: Secondary | ICD-10-CM | POA: Diagnosis not present

## 2016-04-01 DIAGNOSIS — I2510 Atherosclerotic heart disease of native coronary artery without angina pectoris: Secondary | ICD-10-CM | POA: Diagnosis not present

## 2016-05-13 DIAGNOSIS — I10 Essential (primary) hypertension: Secondary | ICD-10-CM | POA: Diagnosis not present

## 2016-05-13 DIAGNOSIS — E1159 Type 2 diabetes mellitus with other circulatory complications: Secondary | ICD-10-CM | POA: Diagnosis not present

## 2016-05-13 DIAGNOSIS — K219 Gastro-esophageal reflux disease without esophagitis: Secondary | ICD-10-CM | POA: Diagnosis not present

## 2016-05-13 DIAGNOSIS — J309 Allergic rhinitis, unspecified: Secondary | ICD-10-CM | POA: Diagnosis not present

## 2016-05-13 DIAGNOSIS — N183 Chronic kidney disease, stage 3 (moderate): Secondary | ICD-10-CM | POA: Diagnosis not present

## 2016-05-13 DIAGNOSIS — I2510 Atherosclerotic heart disease of native coronary artery without angina pectoris: Secondary | ICD-10-CM | POA: Diagnosis not present

## 2016-05-13 DIAGNOSIS — R945 Abnormal results of liver function studies: Secondary | ICD-10-CM | POA: Diagnosis not present

## 2016-05-13 DIAGNOSIS — Z1389 Encounter for screening for other disorder: Secondary | ICD-10-CM | POA: Diagnosis not present

## 2016-05-13 DIAGNOSIS — M545 Low back pain: Secondary | ICD-10-CM | POA: Diagnosis not present

## 2016-05-13 DIAGNOSIS — Z9181 History of falling: Secondary | ICD-10-CM | POA: Diagnosis not present

## 2016-05-13 DIAGNOSIS — L989 Disorder of the skin and subcutaneous tissue, unspecified: Secondary | ICD-10-CM | POA: Diagnosis not present

## 2016-05-13 DIAGNOSIS — E785 Hyperlipidemia, unspecified: Secondary | ICD-10-CM | POA: Diagnosis not present

## 2016-06-12 DIAGNOSIS — L821 Other seborrheic keratosis: Secondary | ICD-10-CM | POA: Diagnosis not present

## 2016-06-12 DIAGNOSIS — L578 Other skin changes due to chronic exposure to nonionizing radiation: Secondary | ICD-10-CM | POA: Diagnosis not present

## 2016-06-12 DIAGNOSIS — B079 Viral wart, unspecified: Secondary | ICD-10-CM | POA: Diagnosis not present

## 2016-06-19 DIAGNOSIS — E1159 Type 2 diabetes mellitus with other circulatory complications: Secondary | ICD-10-CM | POA: Diagnosis not present

## 2016-06-19 DIAGNOSIS — N183 Chronic kidney disease, stage 3 (moderate): Secondary | ICD-10-CM | POA: Diagnosis not present

## 2016-06-19 DIAGNOSIS — M545 Low back pain: Secondary | ICD-10-CM | POA: Diagnosis not present

## 2016-06-19 DIAGNOSIS — R42 Dizziness and giddiness: Secondary | ICD-10-CM | POA: Diagnosis not present

## 2016-06-19 DIAGNOSIS — I10 Essential (primary) hypertension: Secondary | ICD-10-CM | POA: Diagnosis not present

## 2016-06-19 DIAGNOSIS — I2510 Atherosclerotic heart disease of native coronary artery without angina pectoris: Secondary | ICD-10-CM | POA: Diagnosis not present

## 2016-06-19 DIAGNOSIS — R945 Abnormal results of liver function studies: Secondary | ICD-10-CM | POA: Diagnosis not present

## 2016-06-19 DIAGNOSIS — K219 Gastro-esophageal reflux disease without esophagitis: Secondary | ICD-10-CM | POA: Diagnosis not present

## 2016-06-19 DIAGNOSIS — E669 Obesity, unspecified: Secondary | ICD-10-CM | POA: Diagnosis not present

## 2016-06-19 DIAGNOSIS — E785 Hyperlipidemia, unspecified: Secondary | ICD-10-CM | POA: Diagnosis not present

## 2016-06-19 DIAGNOSIS — Z6830 Body mass index (BMI) 30.0-30.9, adult: Secondary | ICD-10-CM | POA: Diagnosis not present

## 2016-06-19 DIAGNOSIS — J309 Allergic rhinitis, unspecified: Secondary | ICD-10-CM | POA: Diagnosis not present

## 2016-07-15 DIAGNOSIS — Z23 Encounter for immunization: Secondary | ICD-10-CM | POA: Diagnosis not present

## 2016-07-16 DIAGNOSIS — S5001XA Contusion of right elbow, initial encounter: Secondary | ICD-10-CM | POA: Diagnosis not present

## 2016-08-18 DIAGNOSIS — Z6832 Body mass index (BMI) 32.0-32.9, adult: Secondary | ICD-10-CM | POA: Diagnosis not present

## 2016-08-18 DIAGNOSIS — J309 Allergic rhinitis, unspecified: Secondary | ICD-10-CM | POA: Diagnosis not present

## 2016-08-18 DIAGNOSIS — I2510 Atherosclerotic heart disease of native coronary artery without angina pectoris: Secondary | ICD-10-CM | POA: Diagnosis not present

## 2016-08-18 DIAGNOSIS — K219 Gastro-esophageal reflux disease without esophagitis: Secondary | ICD-10-CM | POA: Diagnosis not present

## 2016-08-18 DIAGNOSIS — R945 Abnormal results of liver function studies: Secondary | ICD-10-CM | POA: Diagnosis not present

## 2016-08-18 DIAGNOSIS — E669 Obesity, unspecified: Secondary | ICD-10-CM | POA: Diagnosis not present

## 2016-08-18 DIAGNOSIS — N183 Chronic kidney disease, stage 3 (moderate): Secondary | ICD-10-CM | POA: Diagnosis not present

## 2016-08-18 DIAGNOSIS — E785 Hyperlipidemia, unspecified: Secondary | ICD-10-CM | POA: Diagnosis not present

## 2016-08-18 DIAGNOSIS — E1159 Type 2 diabetes mellitus with other circulatory complications: Secondary | ICD-10-CM | POA: Diagnosis not present

## 2016-08-18 DIAGNOSIS — M545 Low back pain: Secondary | ICD-10-CM | POA: Diagnosis not present

## 2016-08-18 DIAGNOSIS — I10 Essential (primary) hypertension: Secondary | ICD-10-CM | POA: Diagnosis not present

## 2016-11-25 DIAGNOSIS — E669 Obesity, unspecified: Secondary | ICD-10-CM | POA: Diagnosis not present

## 2016-11-25 DIAGNOSIS — E785 Hyperlipidemia, unspecified: Secondary | ICD-10-CM | POA: Diagnosis not present

## 2016-11-25 DIAGNOSIS — E1159 Type 2 diabetes mellitus with other circulatory complications: Secondary | ICD-10-CM | POA: Diagnosis not present

## 2016-11-25 DIAGNOSIS — R945 Abnormal results of liver function studies: Secondary | ICD-10-CM | POA: Diagnosis not present

## 2016-11-25 DIAGNOSIS — I10 Essential (primary) hypertension: Secondary | ICD-10-CM | POA: Diagnosis not present

## 2016-11-25 DIAGNOSIS — Z6832 Body mass index (BMI) 32.0-32.9, adult: Secondary | ICD-10-CM | POA: Diagnosis not present

## 2016-11-25 DIAGNOSIS — K219 Gastro-esophageal reflux disease without esophagitis: Secondary | ICD-10-CM | POA: Diagnosis not present

## 2016-11-25 DIAGNOSIS — N183 Chronic kidney disease, stage 3 (moderate): Secondary | ICD-10-CM | POA: Diagnosis not present

## 2016-11-25 DIAGNOSIS — I2510 Atherosclerotic heart disease of native coronary artery without angina pectoris: Secondary | ICD-10-CM | POA: Diagnosis not present

## 2016-11-25 DIAGNOSIS — M545 Low back pain: Secondary | ICD-10-CM | POA: Diagnosis not present

## 2016-11-25 DIAGNOSIS — J309 Allergic rhinitis, unspecified: Secondary | ICD-10-CM | POA: Diagnosis not present

## 2016-12-24 DIAGNOSIS — I10 Essential (primary) hypertension: Secondary | ICD-10-CM | POA: Diagnosis not present

## 2016-12-24 DIAGNOSIS — R945 Abnormal results of liver function studies: Secondary | ICD-10-CM | POA: Diagnosis not present

## 2016-12-24 DIAGNOSIS — E669 Obesity, unspecified: Secondary | ICD-10-CM | POA: Diagnosis not present

## 2016-12-24 DIAGNOSIS — K219 Gastro-esophageal reflux disease without esophagitis: Secondary | ICD-10-CM | POA: Diagnosis not present

## 2016-12-24 DIAGNOSIS — E785 Hyperlipidemia, unspecified: Secondary | ICD-10-CM | POA: Diagnosis not present

## 2016-12-24 DIAGNOSIS — Z6832 Body mass index (BMI) 32.0-32.9, adult: Secondary | ICD-10-CM | POA: Diagnosis not present

## 2016-12-24 DIAGNOSIS — E1159 Type 2 diabetes mellitus with other circulatory complications: Secondary | ICD-10-CM | POA: Diagnosis not present

## 2016-12-24 DIAGNOSIS — I2510 Atherosclerotic heart disease of native coronary artery without angina pectoris: Secondary | ICD-10-CM | POA: Diagnosis not present

## 2016-12-24 DIAGNOSIS — M545 Low back pain: Secondary | ICD-10-CM | POA: Diagnosis not present

## 2016-12-24 DIAGNOSIS — N183 Chronic kidney disease, stage 3 (moderate): Secondary | ICD-10-CM | POA: Diagnosis not present

## 2016-12-24 DIAGNOSIS — J309 Allergic rhinitis, unspecified: Secondary | ICD-10-CM | POA: Diagnosis not present

## 2017-01-02 DIAGNOSIS — Z6832 Body mass index (BMI) 32.0-32.9, adult: Secondary | ICD-10-CM | POA: Diagnosis not present

## 2017-01-02 DIAGNOSIS — Z9181 History of falling: Secondary | ICD-10-CM | POA: Diagnosis not present

## 2017-01-02 DIAGNOSIS — Z125 Encounter for screening for malignant neoplasm of prostate: Secondary | ICD-10-CM | POA: Diagnosis not present

## 2017-01-02 DIAGNOSIS — Z136 Encounter for screening for cardiovascular disorders: Secondary | ICD-10-CM | POA: Diagnosis not present

## 2017-01-02 DIAGNOSIS — Z1389 Encounter for screening for other disorder: Secondary | ICD-10-CM | POA: Diagnosis not present

## 2017-01-02 DIAGNOSIS — Z0000 Encounter for general adult medical examination without abnormal findings: Secondary | ICD-10-CM | POA: Diagnosis not present

## 2017-01-02 DIAGNOSIS — E669 Obesity, unspecified: Secondary | ICD-10-CM | POA: Diagnosis not present

## 2017-01-02 DIAGNOSIS — E785 Hyperlipidemia, unspecified: Secondary | ICD-10-CM | POA: Diagnosis not present

## 2017-02-23 DIAGNOSIS — I10 Essential (primary) hypertension: Secondary | ICD-10-CM | POA: Diagnosis not present

## 2017-02-23 DIAGNOSIS — K219 Gastro-esophageal reflux disease without esophagitis: Secondary | ICD-10-CM | POA: Diagnosis not present

## 2017-02-23 DIAGNOSIS — Z6830 Body mass index (BMI) 30.0-30.9, adult: Secondary | ICD-10-CM | POA: Diagnosis not present

## 2017-02-23 DIAGNOSIS — E785 Hyperlipidemia, unspecified: Secondary | ICD-10-CM | POA: Diagnosis not present

## 2017-02-23 DIAGNOSIS — E669 Obesity, unspecified: Secondary | ICD-10-CM | POA: Diagnosis not present

## 2017-02-23 DIAGNOSIS — J309 Allergic rhinitis, unspecified: Secondary | ICD-10-CM | POA: Diagnosis not present

## 2017-02-23 DIAGNOSIS — N183 Chronic kidney disease, stage 3 (moderate): Secondary | ICD-10-CM | POA: Diagnosis not present

## 2017-02-23 DIAGNOSIS — I2510 Atherosclerotic heart disease of native coronary artery without angina pectoris: Secondary | ICD-10-CM | POA: Diagnosis not present

## 2017-02-23 DIAGNOSIS — M545 Low back pain: Secondary | ICD-10-CM | POA: Diagnosis not present

## 2017-02-23 DIAGNOSIS — R945 Abnormal results of liver function studies: Secondary | ICD-10-CM | POA: Diagnosis not present

## 2017-02-23 DIAGNOSIS — E1159 Type 2 diabetes mellitus with other circulatory complications: Secondary | ICD-10-CM | POA: Diagnosis not present

## 2017-03-31 DIAGNOSIS — E119 Type 2 diabetes mellitus without complications: Secondary | ICD-10-CM | POA: Diagnosis not present

## 2017-04-10 DIAGNOSIS — K219 Gastro-esophageal reflux disease without esophagitis: Secondary | ICD-10-CM | POA: Diagnosis not present

## 2017-04-10 DIAGNOSIS — R945 Abnormal results of liver function studies: Secondary | ICD-10-CM | POA: Diagnosis not present

## 2017-04-10 DIAGNOSIS — I2510 Atherosclerotic heart disease of native coronary artery without angina pectoris: Secondary | ICD-10-CM | POA: Diagnosis not present

## 2017-04-10 DIAGNOSIS — N183 Chronic kidney disease, stage 3 (moderate): Secondary | ICD-10-CM | POA: Diagnosis not present

## 2017-04-10 DIAGNOSIS — M545 Low back pain: Secondary | ICD-10-CM | POA: Diagnosis not present

## 2017-04-10 DIAGNOSIS — E785 Hyperlipidemia, unspecified: Secondary | ICD-10-CM | POA: Diagnosis not present

## 2017-04-10 DIAGNOSIS — Z6830 Body mass index (BMI) 30.0-30.9, adult: Secondary | ICD-10-CM | POA: Diagnosis not present

## 2017-04-10 DIAGNOSIS — J309 Allergic rhinitis, unspecified: Secondary | ICD-10-CM | POA: Diagnosis not present

## 2017-04-10 DIAGNOSIS — E669 Obesity, unspecified: Secondary | ICD-10-CM | POA: Diagnosis not present

## 2017-04-10 DIAGNOSIS — I10 Essential (primary) hypertension: Secondary | ICD-10-CM | POA: Diagnosis not present

## 2017-04-10 DIAGNOSIS — E1159 Type 2 diabetes mellitus with other circulatory complications: Secondary | ICD-10-CM | POA: Diagnosis not present

## 2017-04-14 DIAGNOSIS — E119 Type 2 diabetes mellitus without complications: Secondary | ICD-10-CM | POA: Diagnosis not present

## 2017-04-22 DIAGNOSIS — E119 Type 2 diabetes mellitus without complications: Secondary | ICD-10-CM | POA: Diagnosis not present

## 2017-05-22 DIAGNOSIS — N183 Chronic kidney disease, stage 3 (moderate): Secondary | ICD-10-CM | POA: Diagnosis not present

## 2017-05-22 DIAGNOSIS — K219 Gastro-esophageal reflux disease without esophagitis: Secondary | ICD-10-CM | POA: Diagnosis not present

## 2017-05-22 DIAGNOSIS — J309 Allergic rhinitis, unspecified: Secondary | ICD-10-CM | POA: Diagnosis not present

## 2017-05-22 DIAGNOSIS — E1159 Type 2 diabetes mellitus with other circulatory complications: Secondary | ICD-10-CM | POA: Diagnosis not present

## 2017-05-22 DIAGNOSIS — Z6829 Body mass index (BMI) 29.0-29.9, adult: Secondary | ICD-10-CM | POA: Diagnosis not present

## 2017-05-22 DIAGNOSIS — M545 Low back pain: Secondary | ICD-10-CM | POA: Diagnosis not present

## 2017-05-22 DIAGNOSIS — I2510 Atherosclerotic heart disease of native coronary artery without angina pectoris: Secondary | ICD-10-CM | POA: Diagnosis not present

## 2017-05-22 DIAGNOSIS — I10 Essential (primary) hypertension: Secondary | ICD-10-CM | POA: Diagnosis not present

## 2017-05-22 DIAGNOSIS — E663 Overweight: Secondary | ICD-10-CM | POA: Diagnosis not present

## 2017-05-22 DIAGNOSIS — R945 Abnormal results of liver function studies: Secondary | ICD-10-CM | POA: Diagnosis not present

## 2017-05-22 DIAGNOSIS — E785 Hyperlipidemia, unspecified: Secondary | ICD-10-CM | POA: Diagnosis not present

## 2017-09-14 DIAGNOSIS — I10 Essential (primary) hypertension: Secondary | ICD-10-CM | POA: Diagnosis not present

## 2017-09-14 DIAGNOSIS — K219 Gastro-esophageal reflux disease without esophagitis: Secondary | ICD-10-CM | POA: Diagnosis not present

## 2017-09-14 DIAGNOSIS — J309 Allergic rhinitis, unspecified: Secondary | ICD-10-CM | POA: Diagnosis not present

## 2017-09-14 DIAGNOSIS — I2510 Atherosclerotic heart disease of native coronary artery without angina pectoris: Secondary | ICD-10-CM | POA: Diagnosis not present

## 2017-09-14 DIAGNOSIS — R945 Abnormal results of liver function studies: Secondary | ICD-10-CM | POA: Diagnosis not present

## 2017-09-14 DIAGNOSIS — E785 Hyperlipidemia, unspecified: Secondary | ICD-10-CM | POA: Diagnosis not present

## 2017-09-14 DIAGNOSIS — N183 Chronic kidney disease, stage 3 (moderate): Secondary | ICD-10-CM | POA: Diagnosis not present

## 2017-09-14 DIAGNOSIS — E1159 Type 2 diabetes mellitus with other circulatory complications: Secondary | ICD-10-CM | POA: Diagnosis not present

## 2017-09-14 DIAGNOSIS — Z6830 Body mass index (BMI) 30.0-30.9, adult: Secondary | ICD-10-CM | POA: Diagnosis not present

## 2017-09-14 DIAGNOSIS — M545 Low back pain: Secondary | ICD-10-CM | POA: Diagnosis not present

## 2017-09-14 DIAGNOSIS — E669 Obesity, unspecified: Secondary | ICD-10-CM | POA: Diagnosis not present

## 2018-01-12 DIAGNOSIS — E669 Obesity, unspecified: Secondary | ICD-10-CM | POA: Diagnosis not present

## 2018-01-12 DIAGNOSIS — Z9181 History of falling: Secondary | ICD-10-CM | POA: Diagnosis not present

## 2018-01-12 DIAGNOSIS — E785 Hyperlipidemia, unspecified: Secondary | ICD-10-CM | POA: Diagnosis not present

## 2018-01-12 DIAGNOSIS — I10 Essential (primary) hypertension: Secondary | ICD-10-CM | POA: Diagnosis not present

## 2018-01-12 DIAGNOSIS — N183 Chronic kidney disease, stage 3 (moderate): Secondary | ICD-10-CM | POA: Diagnosis not present

## 2018-01-12 DIAGNOSIS — J309 Allergic rhinitis, unspecified: Secondary | ICD-10-CM | POA: Diagnosis not present

## 2018-01-12 DIAGNOSIS — I2510 Atherosclerotic heart disease of native coronary artery without angina pectoris: Secondary | ICD-10-CM | POA: Diagnosis not present

## 2018-01-12 DIAGNOSIS — K219 Gastro-esophageal reflux disease without esophagitis: Secondary | ICD-10-CM | POA: Diagnosis not present

## 2018-01-12 DIAGNOSIS — M545 Low back pain: Secondary | ICD-10-CM | POA: Diagnosis not present

## 2018-01-12 DIAGNOSIS — R945 Abnormal results of liver function studies: Secondary | ICD-10-CM | POA: Diagnosis not present

## 2018-01-12 DIAGNOSIS — Z1331 Encounter for screening for depression: Secondary | ICD-10-CM | POA: Diagnosis not present

## 2018-01-12 DIAGNOSIS — E1159 Type 2 diabetes mellitus with other circulatory complications: Secondary | ICD-10-CM | POA: Diagnosis not present

## 2018-01-19 DIAGNOSIS — I10 Essential (primary) hypertension: Secondary | ICD-10-CM | POA: Diagnosis not present

## 2018-01-19 DIAGNOSIS — E785 Hyperlipidemia, unspecified: Secondary | ICD-10-CM | POA: Diagnosis not present

## 2018-01-19 DIAGNOSIS — Z1331 Encounter for screening for depression: Secondary | ICD-10-CM | POA: Diagnosis not present

## 2018-01-19 DIAGNOSIS — M545 Low back pain: Secondary | ICD-10-CM | POA: Diagnosis not present

## 2018-01-19 DIAGNOSIS — K219 Gastro-esophageal reflux disease without esophagitis: Secondary | ICD-10-CM | POA: Diagnosis not present

## 2018-01-19 DIAGNOSIS — Z9181 History of falling: Secondary | ICD-10-CM | POA: Diagnosis not present

## 2018-01-19 DIAGNOSIS — R229 Localized swelling, mass and lump, unspecified: Secondary | ICD-10-CM | POA: Diagnosis not present

## 2018-01-19 DIAGNOSIS — E1159 Type 2 diabetes mellitus with other circulatory complications: Secondary | ICD-10-CM | POA: Diagnosis not present

## 2018-01-19 DIAGNOSIS — J309 Allergic rhinitis, unspecified: Secondary | ICD-10-CM | POA: Diagnosis not present

## 2018-01-19 DIAGNOSIS — Z6830 Body mass index (BMI) 30.0-30.9, adult: Secondary | ICD-10-CM | POA: Diagnosis not present

## 2018-01-19 DIAGNOSIS — N183 Chronic kidney disease, stage 3 (moderate): Secondary | ICD-10-CM | POA: Diagnosis not present

## 2018-03-23 DIAGNOSIS — M9906 Segmental and somatic dysfunction of lower extremity: Secondary | ICD-10-CM | POA: Diagnosis not present

## 2018-03-23 DIAGNOSIS — M4606 Spinal enthesopathy, lumbar region: Secondary | ICD-10-CM | POA: Diagnosis not present

## 2018-03-23 DIAGNOSIS — M9905 Segmental and somatic dysfunction of pelvic region: Secondary | ICD-10-CM | POA: Diagnosis not present

## 2018-03-23 DIAGNOSIS — S336XXA Sprain of sacroiliac joint, initial encounter: Secondary | ICD-10-CM | POA: Diagnosis not present

## 2018-03-23 DIAGNOSIS — M9902 Segmental and somatic dysfunction of thoracic region: Secondary | ICD-10-CM | POA: Diagnosis not present

## 2018-03-23 DIAGNOSIS — M1712 Unilateral primary osteoarthritis, left knee: Secondary | ICD-10-CM | POA: Diagnosis not present

## 2018-03-23 DIAGNOSIS — M545 Low back pain: Secondary | ICD-10-CM | POA: Diagnosis not present

## 2018-03-23 DIAGNOSIS — M9903 Segmental and somatic dysfunction of lumbar region: Secondary | ICD-10-CM | POA: Diagnosis not present

## 2018-05-13 DIAGNOSIS — J309 Allergic rhinitis, unspecified: Secondary | ICD-10-CM | POA: Diagnosis not present

## 2018-05-13 DIAGNOSIS — E1159 Type 2 diabetes mellitus with other circulatory complications: Secondary | ICD-10-CM | POA: Diagnosis not present

## 2018-05-13 DIAGNOSIS — K219 Gastro-esophageal reflux disease without esophagitis: Secondary | ICD-10-CM | POA: Diagnosis not present

## 2018-05-13 DIAGNOSIS — N183 Chronic kidney disease, stage 3 (moderate): Secondary | ICD-10-CM | POA: Diagnosis not present

## 2018-05-13 DIAGNOSIS — E785 Hyperlipidemia, unspecified: Secondary | ICD-10-CM | POA: Diagnosis not present

## 2018-05-13 DIAGNOSIS — I10 Essential (primary) hypertension: Secondary | ICD-10-CM | POA: Diagnosis not present

## 2018-05-13 DIAGNOSIS — M545 Low back pain: Secondary | ICD-10-CM | POA: Diagnosis not present

## 2018-05-13 DIAGNOSIS — Z1339 Encounter for screening examination for other mental health and behavioral disorders: Secondary | ICD-10-CM | POA: Diagnosis not present

## 2018-07-19 DIAGNOSIS — Z23 Encounter for immunization: Secondary | ICD-10-CM | POA: Diagnosis not present

## 2018-07-20 DIAGNOSIS — M170 Bilateral primary osteoarthritis of knee: Secondary | ICD-10-CM | POA: Diagnosis not present

## 2018-07-20 DIAGNOSIS — M11262 Other chondrocalcinosis, left knee: Secondary | ICD-10-CM | POA: Diagnosis not present

## 2018-07-20 DIAGNOSIS — M25561 Pain in right knee: Secondary | ICD-10-CM | POA: Diagnosis not present

## 2018-07-20 DIAGNOSIS — G8929 Other chronic pain: Secondary | ICD-10-CM | POA: Diagnosis not present

## 2018-07-20 DIAGNOSIS — M25362 Other instability, left knee: Secondary | ICD-10-CM | POA: Diagnosis not present

## 2018-07-20 DIAGNOSIS — M25562 Pain in left knee: Secondary | ICD-10-CM | POA: Diagnosis not present

## 2018-07-20 DIAGNOSIS — M11261 Other chondrocalcinosis, right knee: Secondary | ICD-10-CM | POA: Diagnosis not present

## 2018-07-21 ENCOUNTER — Other Ambulatory Visit: Payer: Self-pay

## 2018-07-23 ENCOUNTER — Encounter: Payer: Self-pay | Admitting: Cardiology

## 2018-07-23 ENCOUNTER — Ambulatory Visit (INDEPENDENT_AMBULATORY_CARE_PROVIDER_SITE_OTHER): Payer: Medicare Other | Admitting: Cardiology

## 2018-07-23 VITALS — BP 126/74 | HR 63 | Ht 69.0 in | Wt 198.0 lb

## 2018-07-23 DIAGNOSIS — E782 Mixed hyperlipidemia: Secondary | ICD-10-CM

## 2018-07-23 DIAGNOSIS — I25118 Atherosclerotic heart disease of native coronary artery with other forms of angina pectoris: Secondary | ICD-10-CM

## 2018-07-23 DIAGNOSIS — E119 Type 2 diabetes mellitus without complications: Secondary | ICD-10-CM

## 2018-07-23 DIAGNOSIS — I10 Essential (primary) hypertension: Secondary | ICD-10-CM | POA: Diagnosis not present

## 2018-07-23 NOTE — Patient Instructions (Signed)
Medication Instructions:  Your physician recommends that you continue on your current medications as directed. Please refer to the Current Medication list given to you today.  **Stop aspirin 1 week prior to surgery.   Labwork: None  Testing/Procedures: You had an EKG today.   Follow-Up: Your physician wants you to follow-up in: 1 year. You will receive a reminder letter in the mail two months in advance. If you don't receive a letter, please call our office to schedule the follow-up appointment.   If you need a refill on your cardiac medications before your next appointment, please call your pharmacy.   Thank you for choosing [facility]! Robyne Peers, RN [phone]

## 2018-07-23 NOTE — Progress Notes (Signed)
Cardiology Office Note:    Date:  07/23/2018   ID:  Matthew Ferguson, DOB 03-Jul-1946, MRN [id number]  PCP:  Cher Nakai, MD  Cardiologist:  Shirlee More, MD    Referring MD: No ref. provider found    ASSESSMENT:    1. Coronary artery disease of native artery of native heart with stable angina pectoris ([place])   2. Essential hypertension   3. Mixed hyperlipidemia   4. Type 2 diabetes mellitus without complication, without long-term current use of insulin (HCC)    PLAN:    Stable continue current treatment In order of problems listed above:  1. Stable continue medical treatment at this time does not require an ischemia evaluation is low risk for his planned arthroscopic knee surgery and withdrawal aspirin 1 week prior to surgery and I asked the surgeons to tell him when he safe to resume 2. Stable continue current treatment including ACE inhibitor 3. Continue statin lipids are ideal with LDL less than 55 4. Poorly controlled with additional agent as needed GLP such as Victoza or SGLT2 inhibitor would be appropriate   Next appointment: 1 year   Medication Adjustments/Labs and Tests Ordered: Current medicines are reviewed at length with the patient today.  Concerns regarding medicines are outlined above.  Orders Placed This Encounter  Procedures  . EKG 12-Lead   No orders of the defined types were placed in this encounter.   Chief Complaint  Patient presents with  . Coronary Artery Disease    History of Present Illness:    Matthew Ferguson is a 72 y.o. male with a hx of CAD with PCI and stent RCA 2013, hypertension and hyperlipidemia last seen 05/29/15. Compliance with diet, lifestyle and medications: yes  He is pending arthroscopic knee surgery for pain Past Medical History:  Diagnosis Date  . Coronary artery disease involving native coronary artery 05/29/2015   05/08/12 had PCI and Xience stent to RCA for Troponin normal unstable angina with residual 40-50% proximal and 30-40  % mid LAD stenosis, EF normal 55-60%  . Essential hypertension 05/29/2015  . Mixed hyperlipidemia 05/29/2015  . Type 2 diabetes mellitus ([place]) 05/29/2015    Past Surgical History:  Procedure Laterality Date  . BACK SURGERY    . CARDIAC CATHETERIZATION    . CHOLECYSTECTOMY    . CORONARY ANGIOPLASTY WITH STENT PLACEMENT      Current Medications: Current Meds  Medication Sig  . aspirin EC 81 MG tablet Take 81 mg by mouth daily.  Marland Kitchen atorvastatin (LIPITOR) 10 MG tablet Take 10 mg by mouth daily.  Marland Kitchen dexlansoprazole (DEXILANT) 60 MG capsule Take 60 mg by mouth daily.  . enalapril (VASOTEC) 5 MG tablet Take 5 mg by mouth daily.  . fluticasone (FLOVENT DISKUS) 50 MCG/BLIST diskus inhaler Inhale 1 puff into the lungs 2 (two) times daily.  Marland Kitchen glimepiride (AMARYL) 4 MG tablet Take 4 mg by mouth daily.  . metoprolol tartrate (LOPRESSOR) 25 MG tablet Take 25 mg by mouth 2 (two) times daily.  . nitroGLYCERIN (NITROSTAT) 0.4 MG SL tablet DISSOLVE 1 TABLET UNDER THE TONGUE EVERY 5 MINUTES FOR UP TO 3 DOSES AS NEEDED FOR CHEST PAIN  . sitaGLIPtin (JANUVIA) 100 MG tablet Take 100 mg by mouth daily.     Allergies:   Lidocaine   Social History   Socioeconomic History  . Marital status: Married    Spouse name: Not on file  . Number of children: Not on file  . Years of education: Not on file  .  Highest education level: Not on file  Occupational History  . Not on file  Social Needs  . Financial resource strain: Not on file  . Food insecurity:    Worry: Not on file    Inability: Not on file  . Transportation needs:    Medical: Not on file    Non-medical: Not on file  Tobacco Use  . Smoking status: Never Smoker  . Smokeless tobacco: Never Used  Substance and Sexual Activity  . Alcohol use: Never    Frequency: Never  . Drug use: Never  . Sexual activity: Not on file  Lifestyle  . Physical activity:    Days per week: Not on file    Minutes per session: Not on file  . Stress: Not on file    Relationships  . Social connections:    Talks on phone: Not on file    Gets together: Not on file    Attends religious service: Not on file    Active member of club or organization: Not on file    Attends meetings of clubs or organizations: Not on file    Relationship status: Not on file  Other Topics Concern  . Not on file  Social History Narrative  . Not on file     Family History: The patient's family history includes Diabetes in his father; Stroke in his father. ROS:   Please see the history of present illness.    All other systems reviewed and are negative.  EKGs/Labs/Other Studies Reviewed:    The following studies were reviewed today:  EKG:  EKG ordered today.  The ekg ordered today demonstrates [place] normal  Recent Labs:  Chol 110 LDL 50 HDL 31 A1c 7.8 Cr 1.0   Physical Exam:    VS:  BP 126/74 (BP Location: Right Arm, Patient Position: Sitting, Cuff Size: Normal)   Pulse 63   Ht 5\' 9"  (1.753 m)   Wt 198 lb (89.8 kg)   SpO2 98%   BMI 29.24 kg/m     Wt Readings from Last 3 Encounters:  07/23/18 198 lb (89.8 kg)     GEN:  Well nourished, well developed in no acute distress HEENT: Normal NECK: No JVD; No carotid bruits LYMPHATICS: No lymphadenopathy CARDIAC: RRR, no murmurs, rubs, gallops RESPIRATORY:  Clear to auscultation without rales, wheezing or rhonchi  ABDOMEN: Soft, non-tender, non-distended MUSCULOSKELETAL:  No edema; No deformity  SKIN: Warm and dry NEUROLOGIC:  Alert and oriented x 3 PSYCHIATRIC:  Normal affect    Signed, Shirlee More, MD  07/23/2018 1:56 PM    [facility]

## 2018-08-04 DIAGNOSIS — M109 Gout, unspecified: Secondary | ICD-10-CM | POA: Diagnosis not present

## 2018-08-04 DIAGNOSIS — G8918 Other acute postprocedural pain: Secondary | ICD-10-CM | POA: Diagnosis not present

## 2018-08-04 DIAGNOSIS — X58XXXA Exposure to other specified factors, initial encounter: Secondary | ICD-10-CM | POA: Diagnosis not present

## 2018-08-04 DIAGNOSIS — Y999 Unspecified external cause status: Secondary | ICD-10-CM | POA: Diagnosis not present

## 2018-08-04 DIAGNOSIS — M94262 Chondromalacia, left knee: Secondary | ICD-10-CM | POA: Diagnosis not present

## 2018-08-04 DIAGNOSIS — M11262 Other chondrocalcinosis, left knee: Secondary | ICD-10-CM | POA: Diagnosis not present

## 2018-08-04 DIAGNOSIS — S83232A Complex tear of medial meniscus, current injury, left knee, initial encounter: Secondary | ICD-10-CM | POA: Diagnosis not present

## 2018-08-04 DIAGNOSIS — S83272A Complex tear of lateral meniscus, current injury, left knee, initial encounter: Secondary | ICD-10-CM | POA: Diagnosis not present

## 2018-08-04 DIAGNOSIS — M6752 Plica syndrome, left knee: Secondary | ICD-10-CM | POA: Diagnosis not present

## 2018-08-09 DIAGNOSIS — K219 Gastro-esophageal reflux disease without esophagitis: Secondary | ICD-10-CM | POA: Diagnosis not present

## 2018-08-09 DIAGNOSIS — M109 Gout, unspecified: Secondary | ICD-10-CM | POA: Diagnosis not present

## 2018-08-09 DIAGNOSIS — M545 Low back pain: Secondary | ICD-10-CM | POA: Diagnosis not present

## 2018-08-09 DIAGNOSIS — N183 Chronic kidney disease, stage 3 (moderate): Secondary | ICD-10-CM | POA: Diagnosis not present

## 2018-08-09 DIAGNOSIS — E1159 Type 2 diabetes mellitus with other circulatory complications: Secondary | ICD-10-CM | POA: Diagnosis not present

## 2018-08-10 DIAGNOSIS — Z9889 Other specified postprocedural states: Secondary | ICD-10-CM | POA: Insufficient documentation

## 2018-08-10 HISTORY — DX: Other specified postprocedural states: Z98.890

## 2018-08-11 DIAGNOSIS — M25462 Effusion, left knee: Secondary | ICD-10-CM | POA: Diagnosis not present

## 2018-08-11 DIAGNOSIS — M62552 Muscle wasting and atrophy, not elsewhere classified, left thigh: Secondary | ICD-10-CM | POA: Diagnosis not present

## 2018-08-11 DIAGNOSIS — R2689 Other abnormalities of gait and mobility: Secondary | ICD-10-CM | POA: Diagnosis not present

## 2018-08-11 DIAGNOSIS — M25562 Pain in left knee: Secondary | ICD-10-CM | POA: Diagnosis not present

## 2018-08-13 DIAGNOSIS — M25462 Effusion, left knee: Secondary | ICD-10-CM | POA: Diagnosis not present

## 2018-08-13 DIAGNOSIS — M25562 Pain in left knee: Secondary | ICD-10-CM | POA: Diagnosis not present

## 2018-08-13 DIAGNOSIS — M62552 Muscle wasting and atrophy, not elsewhere classified, left thigh: Secondary | ICD-10-CM | POA: Diagnosis not present

## 2018-08-13 DIAGNOSIS — R2689 Other abnormalities of gait and mobility: Secondary | ICD-10-CM | POA: Diagnosis not present

## 2018-08-17 DIAGNOSIS — M25562 Pain in left knee: Secondary | ICD-10-CM | POA: Diagnosis not present

## 2018-08-17 DIAGNOSIS — M25462 Effusion, left knee: Secondary | ICD-10-CM | POA: Diagnosis not present

## 2018-08-17 DIAGNOSIS — M62552 Muscle wasting and atrophy, not elsewhere classified, left thigh: Secondary | ICD-10-CM | POA: Diagnosis not present

## 2018-08-17 DIAGNOSIS — R2689 Other abnormalities of gait and mobility: Secondary | ICD-10-CM | POA: Diagnosis not present

## 2018-08-19 DIAGNOSIS — M25462 Effusion, left knee: Secondary | ICD-10-CM | POA: Diagnosis not present

## 2018-08-19 DIAGNOSIS — M25562 Pain in left knee: Secondary | ICD-10-CM | POA: Diagnosis not present

## 2018-08-19 DIAGNOSIS — R2689 Other abnormalities of gait and mobility: Secondary | ICD-10-CM | POA: Diagnosis not present

## 2018-08-19 DIAGNOSIS — M62552 Muscle wasting and atrophy, not elsewhere classified, left thigh: Secondary | ICD-10-CM | POA: Diagnosis not present

## 2018-08-23 DIAGNOSIS — E118 Type 2 diabetes mellitus with unspecified complications: Secondary | ICD-10-CM | POA: Diagnosis not present

## 2018-08-23 DIAGNOSIS — I10 Essential (primary) hypertension: Secondary | ICD-10-CM | POA: Diagnosis not present

## 2018-08-23 DIAGNOSIS — N183 Chronic kidney disease, stage 3 (moderate): Secondary | ICD-10-CM | POA: Diagnosis not present

## 2018-08-23 DIAGNOSIS — K219 Gastro-esophageal reflux disease without esophagitis: Secondary | ICD-10-CM | POA: Diagnosis not present

## 2018-08-23 DIAGNOSIS — R5382 Chronic fatigue, unspecified: Secondary | ICD-10-CM | POA: Diagnosis not present

## 2018-08-23 DIAGNOSIS — M549 Dorsalgia, unspecified: Secondary | ICD-10-CM | POA: Diagnosis not present

## 2018-08-23 DIAGNOSIS — J309 Allergic rhinitis, unspecified: Secondary | ICD-10-CM | POA: Diagnosis not present

## 2018-08-24 DIAGNOSIS — R2689 Other abnormalities of gait and mobility: Secondary | ICD-10-CM | POA: Diagnosis not present

## 2018-08-24 DIAGNOSIS — M25462 Effusion, left knee: Secondary | ICD-10-CM | POA: Diagnosis not present

## 2018-08-24 DIAGNOSIS — M25562 Pain in left knee: Secondary | ICD-10-CM | POA: Diagnosis not present

## 2018-08-24 DIAGNOSIS — M62552 Muscle wasting and atrophy, not elsewhere classified, left thigh: Secondary | ICD-10-CM | POA: Diagnosis not present

## 2018-08-26 DIAGNOSIS — M62552 Muscle wasting and atrophy, not elsewhere classified, left thigh: Secondary | ICD-10-CM | POA: Diagnosis not present

## 2018-08-26 DIAGNOSIS — R2689 Other abnormalities of gait and mobility: Secondary | ICD-10-CM | POA: Diagnosis not present

## 2018-08-26 DIAGNOSIS — M25462 Effusion, left knee: Secondary | ICD-10-CM | POA: Diagnosis not present

## 2018-08-26 DIAGNOSIS — M25562 Pain in left knee: Secondary | ICD-10-CM | POA: Diagnosis not present

## 2018-08-31 DIAGNOSIS — M25562 Pain in left knee: Secondary | ICD-10-CM | POA: Diagnosis not present

## 2018-08-31 DIAGNOSIS — M62552 Muscle wasting and atrophy, not elsewhere classified, left thigh: Secondary | ICD-10-CM | POA: Diagnosis not present

## 2018-08-31 DIAGNOSIS — M25462 Effusion, left knee: Secondary | ICD-10-CM | POA: Diagnosis not present

## 2018-08-31 DIAGNOSIS — R2689 Other abnormalities of gait and mobility: Secondary | ICD-10-CM | POA: Diagnosis not present

## 2018-09-02 DIAGNOSIS — M62552 Muscle wasting and atrophy, not elsewhere classified, left thigh: Secondary | ICD-10-CM | POA: Diagnosis not present

## 2018-09-02 DIAGNOSIS — M25462 Effusion, left knee: Secondary | ICD-10-CM | POA: Diagnosis not present

## 2018-09-02 DIAGNOSIS — R2689 Other abnormalities of gait and mobility: Secondary | ICD-10-CM | POA: Diagnosis not present

## 2018-09-02 DIAGNOSIS — M25562 Pain in left knee: Secondary | ICD-10-CM | POA: Diagnosis not present

## 2018-09-06 DIAGNOSIS — M25562 Pain in left knee: Secondary | ICD-10-CM | POA: Diagnosis not present

## 2018-09-06 DIAGNOSIS — M62552 Muscle wasting and atrophy, not elsewhere classified, left thigh: Secondary | ICD-10-CM | POA: Diagnosis not present

## 2018-09-06 DIAGNOSIS — R2689 Other abnormalities of gait and mobility: Secondary | ICD-10-CM | POA: Diagnosis not present

## 2018-09-06 DIAGNOSIS — M25462 Effusion, left knee: Secondary | ICD-10-CM | POA: Diagnosis not present

## 2018-09-09 DIAGNOSIS — R2689 Other abnormalities of gait and mobility: Secondary | ICD-10-CM | POA: Diagnosis not present

## 2018-09-09 DIAGNOSIS — M25462 Effusion, left knee: Secondary | ICD-10-CM | POA: Diagnosis not present

## 2018-09-09 DIAGNOSIS — M62552 Muscle wasting and atrophy, not elsewhere classified, left thigh: Secondary | ICD-10-CM | POA: Diagnosis not present

## 2018-09-09 DIAGNOSIS — M25562 Pain in left knee: Secondary | ICD-10-CM | POA: Diagnosis not present

## 2018-09-13 DIAGNOSIS — M545 Low back pain: Secondary | ICD-10-CM | POA: Diagnosis not present

## 2018-09-13 DIAGNOSIS — J309 Allergic rhinitis, unspecified: Secondary | ICD-10-CM | POA: Diagnosis not present

## 2018-09-13 DIAGNOSIS — E785 Hyperlipidemia, unspecified: Secondary | ICD-10-CM | POA: Diagnosis not present

## 2018-09-13 DIAGNOSIS — N183 Chronic kidney disease, stage 3 (moderate): Secondary | ICD-10-CM | POA: Diagnosis not present

## 2018-09-13 DIAGNOSIS — K219 Gastro-esophageal reflux disease without esophagitis: Secondary | ICD-10-CM | POA: Diagnosis not present

## 2018-09-13 DIAGNOSIS — Z125 Encounter for screening for malignant neoplasm of prostate: Secondary | ICD-10-CM | POA: Diagnosis not present

## 2018-09-13 DIAGNOSIS — E118 Type 2 diabetes mellitus with unspecified complications: Secondary | ICD-10-CM | POA: Diagnosis not present

## 2018-09-14 DIAGNOSIS — M25562 Pain in left knee: Secondary | ICD-10-CM | POA: Diagnosis not present

## 2018-09-14 DIAGNOSIS — M62552 Muscle wasting and atrophy, not elsewhere classified, left thigh: Secondary | ICD-10-CM | POA: Diagnosis not present

## 2018-09-14 DIAGNOSIS — R2689 Other abnormalities of gait and mobility: Secondary | ICD-10-CM | POA: Diagnosis not present

## 2018-09-14 DIAGNOSIS — M25462 Effusion, left knee: Secondary | ICD-10-CM | POA: Diagnosis not present

## 2018-09-16 DIAGNOSIS — M62552 Muscle wasting and atrophy, not elsewhere classified, left thigh: Secondary | ICD-10-CM | POA: Diagnosis not present

## 2018-09-16 DIAGNOSIS — M25562 Pain in left knee: Secondary | ICD-10-CM | POA: Diagnosis not present

## 2018-09-16 DIAGNOSIS — R2689 Other abnormalities of gait and mobility: Secondary | ICD-10-CM | POA: Diagnosis not present

## 2018-09-16 DIAGNOSIS — M25462 Effusion, left knee: Secondary | ICD-10-CM | POA: Diagnosis not present

## 2018-09-21 DIAGNOSIS — M62552 Muscle wasting and atrophy, not elsewhere classified, left thigh: Secondary | ICD-10-CM | POA: Diagnosis not present

## 2018-09-21 DIAGNOSIS — R2689 Other abnormalities of gait and mobility: Secondary | ICD-10-CM | POA: Diagnosis not present

## 2018-09-21 DIAGNOSIS — M25562 Pain in left knee: Secondary | ICD-10-CM | POA: Diagnosis not present

## 2018-09-21 DIAGNOSIS — M25462 Effusion, left knee: Secondary | ICD-10-CM | POA: Diagnosis not present

## 2019-01-12 DIAGNOSIS — I10 Essential (primary) hypertension: Secondary | ICD-10-CM | POA: Diagnosis not present

## 2019-01-12 DIAGNOSIS — J309 Allergic rhinitis, unspecified: Secondary | ICD-10-CM | POA: Diagnosis not present

## 2019-01-12 DIAGNOSIS — M545 Low back pain: Secondary | ICD-10-CM | POA: Diagnosis not present

## 2019-01-12 DIAGNOSIS — E785 Hyperlipidemia, unspecified: Secondary | ICD-10-CM | POA: Diagnosis not present

## 2019-01-12 DIAGNOSIS — E118 Type 2 diabetes mellitus with unspecified complications: Secondary | ICD-10-CM | POA: Diagnosis not present

## 2019-01-12 DIAGNOSIS — N183 Chronic kidney disease, stage 3 (moderate): Secondary | ICD-10-CM | POA: Diagnosis not present

## 2019-01-12 DIAGNOSIS — I2510 Atherosclerotic heart disease of native coronary artery without angina pectoris: Secondary | ICD-10-CM | POA: Diagnosis not present

## 2019-01-27 DIAGNOSIS — Z1331 Encounter for screening for depression: Secondary | ICD-10-CM | POA: Diagnosis not present

## 2019-01-27 DIAGNOSIS — J309 Allergic rhinitis, unspecified: Secondary | ICD-10-CM | POA: Diagnosis not present

## 2019-01-27 DIAGNOSIS — M545 Low back pain: Secondary | ICD-10-CM | POA: Diagnosis not present

## 2019-01-27 DIAGNOSIS — E118 Type 2 diabetes mellitus with unspecified complications: Secondary | ICD-10-CM | POA: Diagnosis not present

## 2019-01-27 DIAGNOSIS — Z9181 History of falling: Secondary | ICD-10-CM | POA: Diagnosis not present

## 2019-01-27 DIAGNOSIS — N183 Chronic kidney disease, stage 3 (moderate): Secondary | ICD-10-CM | POA: Diagnosis not present

## 2019-02-09 DIAGNOSIS — J309 Allergic rhinitis, unspecified: Secondary | ICD-10-CM | POA: Diagnosis not present

## 2019-02-09 DIAGNOSIS — M545 Low back pain: Secondary | ICD-10-CM | POA: Diagnosis not present

## 2019-02-09 DIAGNOSIS — E118 Type 2 diabetes mellitus with unspecified complications: Secondary | ICD-10-CM | POA: Diagnosis not present

## 2019-02-09 DIAGNOSIS — N183 Chronic kidney disease, stage 3 (moderate): Secondary | ICD-10-CM | POA: Diagnosis not present

## 2019-02-23 DIAGNOSIS — J309 Allergic rhinitis, unspecified: Secondary | ICD-10-CM | POA: Diagnosis not present

## 2019-02-23 DIAGNOSIS — M545 Low back pain: Secondary | ICD-10-CM | POA: Diagnosis not present

## 2019-02-23 DIAGNOSIS — E118 Type 2 diabetes mellitus with unspecified complications: Secondary | ICD-10-CM | POA: Diagnosis not present

## 2019-02-23 DIAGNOSIS — N183 Chronic kidney disease, stage 3 (moderate): Secondary | ICD-10-CM | POA: Diagnosis not present

## 2019-03-08 DIAGNOSIS — I2510 Atherosclerotic heart disease of native coronary artery without angina pectoris: Secondary | ICD-10-CM | POA: Diagnosis not present

## 2019-03-08 DIAGNOSIS — R110 Nausea: Secondary | ICD-10-CM | POA: Diagnosis not present

## 2019-03-08 DIAGNOSIS — N183 Chronic kidney disease, stage 3 (moderate): Secondary | ICD-10-CM | POA: Diagnosis not present

## 2019-03-08 DIAGNOSIS — M545 Low back pain: Secondary | ICD-10-CM | POA: Diagnosis not present

## 2019-03-23 DIAGNOSIS — I2510 Atherosclerotic heart disease of native coronary artery without angina pectoris: Secondary | ICD-10-CM | POA: Diagnosis not present

## 2019-03-23 DIAGNOSIS — N183 Chronic kidney disease, stage 3 (moderate): Secondary | ICD-10-CM | POA: Diagnosis not present

## 2019-03-23 DIAGNOSIS — E118 Type 2 diabetes mellitus with unspecified complications: Secondary | ICD-10-CM | POA: Diagnosis not present

## 2019-03-23 DIAGNOSIS — M545 Low back pain: Secondary | ICD-10-CM | POA: Diagnosis not present

## 2019-05-10 DIAGNOSIS — M545 Low back pain: Secondary | ICD-10-CM | POA: Diagnosis not present

## 2019-05-10 DIAGNOSIS — I2510 Atherosclerotic heart disease of native coronary artery without angina pectoris: Secondary | ICD-10-CM | POA: Diagnosis not present

## 2019-05-10 DIAGNOSIS — E785 Hyperlipidemia, unspecified: Secondary | ICD-10-CM | POA: Diagnosis not present

## 2019-05-10 DIAGNOSIS — I10 Essential (primary) hypertension: Secondary | ICD-10-CM | POA: Diagnosis not present

## 2019-05-10 DIAGNOSIS — E118 Type 2 diabetes mellitus with unspecified complications: Secondary | ICD-10-CM | POA: Diagnosis not present

## 2019-05-10 DIAGNOSIS — Z139 Encounter for screening, unspecified: Secondary | ICD-10-CM | POA: Diagnosis not present

## 2019-05-10 DIAGNOSIS — N183 Chronic kidney disease, stage 3 (moderate): Secondary | ICD-10-CM | POA: Diagnosis not present

## 2019-07-07 DIAGNOSIS — Z23 Encounter for immunization: Secondary | ICD-10-CM | POA: Diagnosis not present

## 2019-08-24 ENCOUNTER — Other Ambulatory Visit: Payer: Self-pay

## 2019-08-24 ENCOUNTER — Other Ambulatory Visit: Payer: Self-pay | Admitting: Sports Medicine

## 2019-08-24 ENCOUNTER — Ambulatory Visit (INDEPENDENT_AMBULATORY_CARE_PROVIDER_SITE_OTHER): Payer: Medicare Other

## 2019-08-24 ENCOUNTER — Encounter: Payer: Self-pay | Admitting: Sports Medicine

## 2019-08-24 ENCOUNTER — Ambulatory Visit (INDEPENDENT_AMBULATORY_CARE_PROVIDER_SITE_OTHER): Payer: Medicare Other | Admitting: Sports Medicine

## 2019-08-24 DIAGNOSIS — M79671 Pain in right foot: Secondary | ICD-10-CM

## 2019-08-24 DIAGNOSIS — M2142 Flat foot [pes planus] (acquired), left foot: Secondary | ICD-10-CM

## 2019-08-24 DIAGNOSIS — M79672 Pain in left foot: Secondary | ICD-10-CM

## 2019-08-24 DIAGNOSIS — E119 Type 2 diabetes mellitus without complications: Secondary | ICD-10-CM | POA: Diagnosis not present

## 2019-08-24 DIAGNOSIS — M722 Plantar fascial fibromatosis: Secondary | ICD-10-CM | POA: Diagnosis not present

## 2019-08-24 DIAGNOSIS — M2141 Flat foot [pes planus] (acquired), right foot: Secondary | ICD-10-CM | POA: Diagnosis not present

## 2019-08-24 DIAGNOSIS — M2040 Other hammer toe(s) (acquired), unspecified foot: Secondary | ICD-10-CM

## 2019-08-24 DIAGNOSIS — E1141 Type 2 diabetes mellitus with diabetic mononeuropathy: Secondary | ICD-10-CM | POA: Diagnosis not present

## 2019-08-24 NOTE — Progress Notes (Signed)
Subjective: Teron Blais is a 73 y.o. male patient with history of diabetes who presents to office today complaining of pain in both feet for years with numbness to toes. Patient states that the glucose reading this morning was 82 mg/dl. Last A1c 7.4 Patient denies any new changes in medication or new problems. Patient admits weird sensation like walking on rocks with some swelling. Has tried padding and inserts without relief. No other pedal complaints.  PCP Dr. Truman Hayward x 2 months ago.  Review of Systems  All other systems reviewed and are negative.    Patient Active Problem List   Diagnosis Date Noted  . Coronary artery disease involving native coronary artery 05/29/2015  . Essential hypertension 05/29/2015  . Mixed hyperlipidemia 05/29/2015  . Type 2 diabetes mellitus ([place]) 05/29/2015   Current Outpatient Medications on File Prior to Visit  Medication Sig Dispense Refill  . aspirin EC 81 MG tablet Take 81 mg by mouth daily.    Marland Kitchen atorvastatin (LIPITOR) 10 MG tablet Take 10 mg by mouth daily.    Marland Kitchen dexlansoprazole (DEXILANT) 60 MG capsule Take 60 mg by mouth daily.    . enalapril (VASOTEC) 5 MG tablet Take 5 mg by mouth daily.    . fluticasone (FLOVENT DISKUS) 50 MCG/BLIST diskus inhaler Inhale 1 puff into the lungs 2 (two) times daily.    Marland Kitchen glimepiride (AMARYL) 4 MG tablet Take 4 mg by mouth daily.    . metFORMIN (GLUCOPHAGE) 850 MG tablet Take 850 mg by mouth 3 (three) times daily.    . metoprolol tartrate (LOPRESSOR) 25 MG tablet Take 25 mg by mouth 2 (two) times daily.    . nitroGLYCERIN (NITROSTAT) 0.4 MG SL tablet DISSOLVE 1 TABLET UNDER THE TONGUE EVERY 5 MINUTES FOR UP TO 3 DOSES AS NEEDED FOR CHEST PAIN    . sitaGLIPtin (JANUVIA) 100 MG tablet Take 100 mg by mouth daily.     No current facility-administered medications on file prior to visit.    Allergies  Allergen Reactions  . Lidocaine Other (See Comments)    UNKNOWN    No results found for this or any previous visit (from  the past 2160 hour(s)).  Objective: General: Patient is awake, alert, and oriented x 3 and in no acute distress.  Integument: Skin is warm, dry and supple bilateral. Nails are short, thickened and  dystrophic with subungual debris, consistent with onychomycosis, 1-5 bilateral. No signs of infection. No open lesions or preulcerative lesions present bilateral. Remaining integument unremarkable.  Vasculature:  Dorsalis Pedis pulse 1/4 bilateral. Posterior Tibial pulse 1/4 bilateral.  Capillary fill time <3 sec 1-5 bilateral. Scant hair growth to the level of the digits. Temperature gradient within normal limits. No varicosities present bilateral. No edema present bilateral.   Neurology: The patient has diminished sensation measured with a 5.07/10g Semmes Weinstein Monofilament at all pedal sites bilateral. Vibratory sensation diminished bilateral with tuning fork. No Babinski sign present bilateral.   Musculoskeletal:Asymptomatic hammertoe and planus pedal deformities noted bilateral. Muscular strength 5/5 in all lower extremity muscular groups bilateral without pain on range of motion. No tenderness with calf compression bilateral.  Assessment and Plan: Problem List Items Addressed This Visit    None    Visit Diagnoses    Encounter for comprehensive diabetic foot examination, type 2 diabetes mellitus ([place])    -  Primary   Neuritis due to diabetes mellitus ([place])       Plantar fasciitis       Pain due to  onychomycosis of toenails of both feet         -Examined patient. -Discussed and educated patient on diabetic foot care, especially with  regards to the vascular, neurological and musculoskeletal systems.  -Stressed the importance of good glycemic control and the detriment of not  controlling glucose levels in relation to the foot. -Safe step diabetic shoe order form was completed; office to contact primary care for approval / certification;  Office to arrange shoe fitting and  dispensing. -Recommend Vit B complex or Alpha lopic acid for neuritis  -Answered all patient questions -Patient to return  in 3 months for at risk foot care and as scheduled for shoe fitting  -Patient advised to call the office if any problems or questions arise in the meantime.  Landis Martins, DPM

## 2019-08-24 NOTE — Patient Instructions (Signed)
Vit B complex Alpha lipoic acid 600 mg for neuropathy symptoms

## 2019-08-25 ENCOUNTER — Other Ambulatory Visit: Payer: Self-pay | Admitting: Sports Medicine

## 2019-08-25 DIAGNOSIS — M722 Plantar fascial fibromatosis: Secondary | ICD-10-CM

## 2019-09-08 NOTE — Progress Notes (Signed)
Cardiology Office Note:    Date:  09/09/2019   ID:  Matthew Ferguson, DOB December 26, 1945, MRN [id number]  PCP:  Matthew Nakai, MD  Cardiologist:  Matthew More, MD    Referring MD: Matthew Nakai, MD    ASSESSMENT:    1. Coronary artery disease of native artery of native heart with stable angina pectoris ([place])   2. Essential hypertension   3. Mixed hyperlipidemia   4. Type 2 diabetes mellitus without complication, without long-term current use of insulin (HCC)    PLAN:    In order of problems listed above:  1. CAD has had an abrupt change now class III symptoms on medical therapy we will add oral mononitrate to beta-blocker aspirin statin refer to coronary angiography and likely percutaneous intervention.  Options risk and benefits detailed with the patient informed consent obtained 2. Stable hypertension continue treatment including ACE inhibitor with diabetes and CAD 3. Stable continue his high intensity statin lipids are ideal 4. Stable he is on good cardioprotective diabetic medical therapy.   Next appointment: 6 weeks   Medication Adjustments/Labs and Tests Ordered: Current medicines are reviewed at length with the patient today.  Concerns regarding medicines are outlined above.  No orders of the defined types were placed in this encounter.  No orders of the defined types were placed in this encounter.   Chief Complaint  Patient presents with  . Hypertension  . Hyperlipidemia  . Coronary Artery Disease    History of Present Illness:    Matthew Ferguson is a 73 y.o. male with a hx of CAD with PCI and an Taxus drug-eluting stent RCA 2013, hypertension and hyperlipidemia  last seen 07/23/2018. Compliance with diet, lifestyle and medications: Yes  Matthew Ferguson has had an abrupt change in his symptom pattern the last month he has developed profound exercise intolerance if he tries to use the upper extremities putting together a vacuum cleaner he has near syncope and profound weakness.  He also  has had exertional angina relieved with rest and nitroglycerin.  Because of the rapid acceleration of anginal symptoms class III Matthew Ferguson direct referral to coronary angiography options benefits and risks detailed he has no dye allergy normal renal function no contraindication to dual antiplatelet therapy.  He will continue treatment including aspirin statin beta-blocker available oral nitrates and will be scheduled in the next week.  He is not having edema orthopnea palpitation or syncope.  I presented the option of either medical therapy or clinical observation or myocardial perfusion study and we both prefer direct referral to coronary angiography. Past Medical History:  Diagnosis Date  . Coronary artery disease involving native coronary artery 05/29/2015   05/08/12 had PCI and Xience stent to RCA for Troponin normal unstable angina with residual 40-50% proximal and 30-40 % mid LAD stenosis, EF normal 55-60%  . Essential hypertension 05/29/2015  . Mixed hyperlipidemia 05/29/2015  . Type 2 diabetes mellitus (Matthew Ferguson) 05/29/2015    Past Surgical History:  Procedure Laterality Date  . BACK SURGERY    . CARDIAC CATHETERIZATION    . CHOLECYSTECTOMY    . CORONARY ANGIOPLASTY WITH STENT PLACEMENT    . KNEE SURGERY      Current Medications: Current Meds  Medication Sig  . aspirin EC 81 MG tablet Take 162 mg by mouth daily.   Marland Kitchen atorvastatin (LIPITOR) 10 MG tablet Take 10 mg by mouth daily.  Marland Kitchen dexlansoprazole (DEXILANT) 60 MG capsule Take 60 mg by mouth daily.  . Dulaglutide (TRULICITY) 8.27 MB/8.6LJ SOPN Inject  into the skin once a week.  . enalapril (VASOTEC) 5 MG tablet Take 5 mg by mouth daily.  . fluticasone (FLONASE) 50 MCG/ACT nasal spray Place 1 spray into both nostrils daily.   . fluticasone (FLOVENT DISKUS) 50 MCG/BLIST diskus inhaler Inhale 1 puff into the lungs 2 (two) times daily.  Marland Kitchen FREESTYLE LITE test strip   . glimepiride (AMARYL) 4 MG tablet Take 4 mg by mouth 2 (two) times daily.    . indomethacin (INDOCIN SR) 75 MG CR capsule Take 75 mg by mouth daily as needed.   . metFORMIN (GLUCOPHAGE) 850 MG tablet Take 850 mg by mouth 3 (three) times daily.  . metoprolol tartrate (LOPRESSOR) 25 MG tablet Take 25 mg by mouth 2 (two) times daily.  . nitroGLYCERIN (NITROSTAT) 0.4 MG SL tablet DISSOLVE 1 TABLET UNDER THE TONGUE EVERY 5 MINUTES FOR UP TO 3 DOSES AS NEEDED FOR CHEST PAIN  . sitaGLIPtin (JANUVIA) 100 MG tablet Take 100 mg by mouth daily.     Allergies:   Anesthetics, amide and Lidocaine   Social History   Socioeconomic History  . Marital status: Married    Spouse name: Not on file  . Number of children: Not on file  . Years of education: Not on file  . Highest education level: Not on file  Occupational History  . Not on file  Social Needs  . Financial resource strain: Not on file  . Food insecurity    Worry: Not on file    Inability: Not on file  . Transportation needs    Medical: Not on file    Non-medical: Not on file  Tobacco Use  . Smoking status: Never Smoker  . Smokeless tobacco: Never Used  Substance and Sexual Activity  . Alcohol use: Never    Frequency: Never  . Drug use: Never  . Sexual activity: Not on file  Lifestyle  . Physical activity    Days per week: Not on file    Minutes per session: Not on file  . Stress: Not on file  Relationships  . Social Herbalist on phone: Not on file    Gets together: Not on file    Attends religious service: Not on file    Active member of club or organization: Not on file    Attends meetings of clubs or organizations: Not on file    Relationship status: Not on file  Other Topics Concern  . Not on file  Social History Narrative  . Not on file     Family History: The patient's family history includes Diabetes in his father; Stroke in his father. ROS:   Please see the history of present illness.    All other systems reviewed and are negative.  EKGs/Labs/Other Studies Reviewed:     The following studies were reviewed today:  EKG:  EKG ordered today and personally reviewed.  The ekg ordered today demonstrates sinus rhythm no ischemic changes  Recent Labs: 05/10/2019 Cholesterol 84 HDL 31 LDL 35 A1c 7.4% creatinine 0.91  Physical Exam:    VS:  Wt 202 lb (91.6 kg)   BMI 29.83 kg/m     Wt Readings from Last 3 Encounters:  09/09/19 202 lb (91.6 kg)  07/23/18 198 lb (89.8 kg)     GEN:  Well nourished, well developed in no acute distress HEENT: Normal NECK: No JVD; No carotid bruits LYMPHATICS: No lymphadenopathy CARDIAC: RRR, no murmurs, rubs, gallops RESPIRATORY:  Clear to auscultation without rales,  wheezing or rhonchi  ABDOMEN: Soft, non-tender, non-distended MUSCULOSKELETAL:  No edema; No deformity  SKIN: Warm and dry NEUROLOGIC:  Alert and oriented x 3 PSYCHIATRIC:  Normal affect    Signed, Matthew More, MD  09/09/2019 2:50 PM    [facility]

## 2019-09-09 ENCOUNTER — Other Ambulatory Visit: Payer: Self-pay

## 2019-09-09 ENCOUNTER — Ambulatory Visit (INDEPENDENT_AMBULATORY_CARE_PROVIDER_SITE_OTHER): Payer: Medicare Other | Admitting: Cardiology

## 2019-09-09 ENCOUNTER — Encounter: Payer: Self-pay | Admitting: Cardiology

## 2019-09-09 VITALS — BP 144/76 | HR 59 | Ht 69.0 in | Wt 202.0 lb

## 2019-09-09 DIAGNOSIS — I200 Unstable angina: Secondary | ICD-10-CM | POA: Diagnosis not present

## 2019-09-09 DIAGNOSIS — I10 Essential (primary) hypertension: Secondary | ICD-10-CM

## 2019-09-09 DIAGNOSIS — E119 Type 2 diabetes mellitus without complications: Secondary | ICD-10-CM | POA: Diagnosis not present

## 2019-09-09 DIAGNOSIS — I25118 Atherosclerotic heart disease of native coronary artery with other forms of angina pectoris: Secondary | ICD-10-CM | POA: Diagnosis not present

## 2019-09-09 DIAGNOSIS — E782 Mixed hyperlipidemia: Secondary | ICD-10-CM

## 2019-09-09 DIAGNOSIS — R0602 Shortness of breath: Secondary | ICD-10-CM | POA: Diagnosis not present

## 2019-09-09 DIAGNOSIS — Z01818 Encounter for other preprocedural examination: Secondary | ICD-10-CM

## 2019-09-09 DIAGNOSIS — Z01810 Encounter for preprocedural cardiovascular examination: Secondary | ICD-10-CM | POA: Diagnosis not present

## 2019-09-09 HISTORY — DX: Encounter for other preprocedural examination: Z01.818

## 2019-09-09 MED ORDER — ASPIRIN EC 81 MG PO TBEC: 81.0000 mg | DELAYED_RELEASE_TABLET | Freq: Every day | ORAL | Status: DC

## 2019-09-09 MED ORDER — ISOSORBIDE MONONITRATE ER 30 MG PO TB24: 30.0000 mg | ORAL_TABLET | Freq: Every day | ORAL | 3 refills | Status: DC

## 2019-09-09 NOTE — Patient Instructions (Addendum)
Medication Instructions:  Your physician has recommended you make the following change in your medication:  START isosorbide mononitrate (imdur) 30 mg: Take 1 tablet daily  DECREASE aspirin 81 mg: Take 1 tablet daily   *If you need a refill on your cardiac medications before your next appointment, please call your pharmacy*  Lab Work: Your physician recommends that you return for lab work today: CMP, CBC, ProBNP, troponin I.   If you have labs (blood work) drawn today and your tests are completely normal, you will receive your results only by: Marland Kitchen MyChart Message (if you have MyChart) OR . A paper copy in the mail If you have any lab test that is abnormal or we need to change your treatment, we will call you to review the results.  Testing/Procedures: You had an EKG today.   A chest x-ray takes a picture of the organs and structures inside the chest, including the heart, lungs, and blood vessels. This test can show several things, including, whether the heart is enlarges; whether fluid is building up in the lungs; and whether pacemaker / defibrillator leads are still in place.   You will have pre-procedural COVID testing done on Tuesday, 09/20/2019, at 11:30 am at [facility] located at [place], [place]. Please arrive 15 minutes early. This is a drive-thru testing site, go to the building overhang. Do not go to the tent or the tent line. Self-isolate after testing until your scheduled procedure.      [place] [address] Dept: [phone] Loc: [phone]  Matthew Ferguson  09/09/2019  You are scheduled for a Cardiac Catheterization on Friday, November 20 with Dr. Glenetta Hew.  1. Please arrive at the [facility] (Main Entrance A) at [facility]: [address] [place], [place] [address] at 7:00 AM (This time is two hours before your  procedure to ensure your preparation). Free valet parking service is available.   Special note: Every effort is made to have your procedure done on time. Please understand that emergencies sometimes delay scheduled procedures.  2. Diet: Do not eat solid foods after midnight.  The patient may have clear liquids until 5am upon the day of the procedure.  3. Labs: None needed.   4. Medication instructions in preparation for your procedure:   Contrast Allergy: No   Do not take Diabetes Med Glucophage (Metformin) on the day of the procedure and HOLD 48 HOURS AFTER THE PROCEDURE.   DO NOT TAKE glimepiride (amaryl) or januvia the day of your procedure until after it is completed.   On the morning of your procedure, take your Aspirin and any morning medicines NOT listed above.  You may use sips of water.  5. Plan for one night stay--bring personal belongings. 6. Bring a current list of your medications and current insurance cards. 7. You MUST have a responsible person to drive you home. 8. Someone MUST be with you the first 24 hours after you arrive home or your discharge will be delayed. 9. Please wear clothes that are easy to get on and off and wear slip-on shoes.  Thank you for allowing [place] to care for you!   -- [facility]  Follow-Up: At [facility], you and your health needs are our priority.  As part of our continuing mission to provide you with exceptional heart care, we have created designated Provider Care Teams.  These Care Teams include your primary Cardiologist (physician) and Advanced Practice Providers (APPs -  Physician Assistants and Nurse Practitioners) who all work together to provide you with the care you need, when you need it.  Your next appointment:   6 weeks  The format for your next appointment:   In Person  Provider:   Shirlee More, MD    Isosorbide Mononitrate extended-release tablets What is this medicine? ISOSORBIDE  MONONITRATE (eye soe SOR bide mon oh NYE trate) is a vasodilator. It relaxes blood vessels, increasing the blood and oxygen supply to your heart. This medicine is used to prevent chest pain caused by angina. It will not help to stop an episode of chest pain. This medicine may be used for other purposes; ask your health care provider or pharmacist if you have questions. COMMON BRAND NAME(S): Imdur, Isotrate ER What should I tell my health care provider before I take this medicine? They need to know if you have any of these conditions:  previous heart attack or heart failure  an unusual or allergic reaction to isosorbide mononitrate, nitrates, other medicines, foods, dyes, or preservatives  pregnant or trying to get pregnant  breast-feeding How should I use this medicine? Take this medicine by mouth with a glass of water. Follow the directions on the prescription label. Do not crush or chew. Take your medicine at regular intervals. Do not take your medicine more often than directed. Do not stop taking this medicine except on the advice of your doctor or health care professional. Talk to your pediatrician regarding the use of this medicine in children. Special care may be needed. Overdosage: If you think you have taken too much of this medicine contact a poison control center or emergency room at once. NOTE: This medicine is only for you. Do not share this medicine with others. What if I miss a dose? If you miss a dose, take it as soon as you can. If it is almost time for your next dose, take only that dose. Do not take double or extra doses. What may interact with this medicine? Do not take this medicine with any of the following medications:  medicines used to treat erectile dysfunction (ED) like avanafil, sildenafil, tadalafil, and vardenafil  riociguat This medicine may also interact with the following medications:  medicines for high blood pressure  other medicines for angina or heart  failure This list may not describe all possible interactions. Give your health care provider a list of all the medicines, herbs, non-prescription drugs, or dietary supplements you use. Also tell them if you smoke, drink alcohol, or use illegal drugs. Some items may interact with your medicine. What should I watch for while using this medicine? Check your heart rate and blood pressure regularly while you are taking this medicine. Ask your doctor or health care professional what your heart rate and blood pressure should be and when you should contact him or her. Tell your doctor or health care professional if you feel your medicine is no longer working. You may get dizzy. Do not drive, use machinery, or do anything that needs mental alertness until you know how this medicine affects you. To reduce the risk of dizzy or fainting spells, do not sit or stand up quickly, especially if you are an older patient. Alcohol can make you more dizzy, and increase flushing and rapid heartbeats. Avoid alcoholic drinks. Do not treat yourself for coughs, colds, or pain while you are taking this medicine without asking your  doctor or health care professional for advice. Some ingredients may increase your blood pressure. What side effects may I notice from receiving this medicine? Side effects that you should report to your doctor or health care professional as soon as possible:  bluish discoloration of lips, fingernails, or palms of hands  irregular heartbeat, palpitations  low blood pressure  nausea, vomiting  persistent headache  unusually weak or tired Side effects that usually do not require medical attention (report to your doctor or health care professional if they continue or are bothersome):  flushing of the face or neck  rash This list may not describe all possible side effects. Call your doctor for medical advice about side effects. You may report side effects to FDA at 1-800-FDA-1088. Where should I  keep my medicine? Keep out of the reach of children. Store between 15 and 30 degrees C (59 and 86 degrees F). Keep container tightly closed. Throw away any unused medicine after the expiration date. NOTE: This sheet is a summary. It may not cover all possible information. If you have questions about this medicine, talk to your doctor, pharmacist, or health care provider.  2020 Elsevier/Gold Standard (2013-08-19 14:48:19)

## 2019-09-10 LAB — CBC
Hematocrit: 29.3 % — ABNORMAL LOW (ref 37.5–51.0)
Hemoglobin: 8.8 g/dL — ABNORMAL LOW (ref 13.0–17.7)
MCH: 22.3 pg — ABNORMAL LOW (ref 26.6–33.0)
MCHC: 30 g/dL — ABNORMAL LOW (ref 31.5–35.7)
MCV: 74 fL — ABNORMAL LOW (ref 79–97)
Platelets: 149 10*3/uL — ABNORMAL LOW (ref 150–450)
RBC: 3.94 x10E6/uL — ABNORMAL LOW (ref 4.14–5.80)
RDW: 17.1 % — ABNORMAL HIGH (ref 11.6–15.4)
WBC: 5.5 10*3/uL (ref 3.4–10.8)

## 2019-09-10 LAB — COMPREHENSIVE METABOLIC PANEL
ALT: 32 IU/L (ref 0–44)
AST: 41 IU/L — ABNORMAL HIGH (ref 0–40)
Albumin/Globulin Ratio: 1.5 (ref 1.2–2.2)
Albumin: 4.1 g/dL (ref 3.7–4.7)
Alkaline Phosphatase: 114 IU/L (ref 39–117)
BUN/Creatinine Ratio: 10 (ref 10–24)
BUN: 12 mg/dL (ref 8–27)
Bilirubin Total: 0.8 mg/dL (ref 0.0–1.2)
CO2: 20 mmol/L (ref 20–29)
Calcium: 9.5 mg/dL (ref 8.6–10.2)
Chloride: 104 mmol/L (ref 96–106)
Creatinine, Ser: 1.23 mg/dL (ref 0.76–1.27)
GFR calc Af Amer: 66 mL/min/{1.73_m2} (ref >59)
GFR calc non Af Amer: 57 mL/min/{1.73_m2} — ABNORMAL LOW (ref >59)
Globulin, Total: 2.7 g/dL (ref 1.5–4.5)
Glucose: 269 mg/dL — ABNORMAL HIGH (ref 65–99)
Potassium: 4.7 mmol/L (ref 3.5–5.2)
Sodium: 138 mmol/L (ref 134–144)
Total Protein: 6.8 g/dL (ref 6.0–8.5)

## 2019-09-10 LAB — PRO B NATRIURETIC PEPTIDE: NT-Pro BNP: 42 pg/mL (ref 0–486)

## 2019-09-10 LAB — TROPONIN I: Troponin I: <0.01 ng/mL (ref 0.00–0.04)

## 2019-09-12 DIAGNOSIS — Z01818 Encounter for other preprocedural examination: Secondary | ICD-10-CM | POA: Diagnosis not present

## 2019-09-12 DIAGNOSIS — R079 Chest pain, unspecified: Secondary | ICD-10-CM | POA: Diagnosis not present

## 2019-09-13 ENCOUNTER — Telehealth: Payer: Self-pay | Admitting: Cardiology

## 2019-09-13 DIAGNOSIS — N1831 Chronic kidney disease, stage 3a: Secondary | ICD-10-CM | POA: Diagnosis not present

## 2019-09-13 DIAGNOSIS — I2510 Atherosclerotic heart disease of native coronary artery without angina pectoris: Secondary | ICD-10-CM | POA: Diagnosis not present

## 2019-09-13 DIAGNOSIS — D649 Anemia, unspecified: Secondary | ICD-10-CM | POA: Diagnosis not present

## 2019-09-13 DIAGNOSIS — Z1331 Encounter for screening for depression: Secondary | ICD-10-CM | POA: Diagnosis not present

## 2019-09-13 DIAGNOSIS — I10 Essential (primary) hypertension: Secondary | ICD-10-CM | POA: Diagnosis not present

## 2019-09-13 DIAGNOSIS — Z9181 History of falling: Secondary | ICD-10-CM | POA: Diagnosis not present

## 2019-09-13 DIAGNOSIS — E118 Type 2 diabetes mellitus with unspecified complications: Secondary | ICD-10-CM | POA: Diagnosis not present

## 2019-09-13 DIAGNOSIS — Z6830 Body mass index (BMI) 30.0-30.9, adult: Secondary | ICD-10-CM | POA: Diagnosis not present

## 2019-09-13 DIAGNOSIS — Z0000 Encounter for general adult medical examination without abnormal findings: Secondary | ICD-10-CM | POA: Diagnosis not present

## 2019-09-13 DIAGNOSIS — E785 Hyperlipidemia, unspecified: Secondary | ICD-10-CM | POA: Diagnosis not present

## 2019-09-13 DIAGNOSIS — M545 Low back pain: Secondary | ICD-10-CM | POA: Diagnosis not present

## 2019-09-13 NOTE — Telephone Encounter (Signed)
Wants to know if he's supposed to keep taking isosorbide

## 2019-09-14 ENCOUNTER — Other Ambulatory Visit: Payer: Self-pay

## 2019-09-14 ENCOUNTER — Ambulatory Visit (INDEPENDENT_AMBULATORY_CARE_PROVIDER_SITE_OTHER): Payer: Self-pay | Admitting: Orthotics

## 2019-09-14 DIAGNOSIS — Z0189 Encounter for other specified special examinations: Secondary | ICD-10-CM

## 2019-09-14 DIAGNOSIS — M2142 Flat foot [pes planus] (acquired), left foot: Secondary | ICD-10-CM

## 2019-09-14 DIAGNOSIS — M2040 Other hammer toe(s) (acquired), unspecified foot: Secondary | ICD-10-CM

## 2019-09-14 DIAGNOSIS — E1141 Type 2 diabetes mellitus with diabetic mononeuropathy: Secondary | ICD-10-CM

## 2019-09-14 DIAGNOSIS — M2141 Flat foot [pes planus] (acquired), right foot: Secondary | ICD-10-CM

## 2019-09-14 DIAGNOSIS — M722 Plantar fascial fibromatosis: Secondary | ICD-10-CM

## 2019-09-14 DIAGNOSIS — E119 Type 2 diabetes mellitus without complications: Secondary | ICD-10-CM

## 2019-09-14 NOTE — Telephone Encounter (Signed)
Called patient and advised that he should continue taking isosorbide mononitrate 30 mg daily as prescribed during his office visit with Dr. Bettina Gavia on 09/09/2019. Patient verbalized understanding and is agreeable. No further questions.

## 2019-09-14 NOTE — Progress Notes (Signed)

## 2019-09-15 DIAGNOSIS — N1831 Chronic kidney disease, stage 3a: Secondary | ICD-10-CM | POA: Diagnosis not present

## 2019-09-15 DIAGNOSIS — E785 Hyperlipidemia, unspecified: Secondary | ICD-10-CM | POA: Diagnosis not present

## 2019-09-15 DIAGNOSIS — I2510 Atherosclerotic heart disease of native coronary artery without angina pectoris: Secondary | ICD-10-CM | POA: Diagnosis not present

## 2019-09-15 DIAGNOSIS — D509 Iron deficiency anemia, unspecified: Secondary | ICD-10-CM | POA: Diagnosis not present

## 2019-09-20 ENCOUNTER — Other Ambulatory Visit (HOSPITAL_COMMUNITY): Payer: Medicare Other

## 2019-09-20 DIAGNOSIS — D509 Iron deficiency anemia, unspecified: Secondary | ICD-10-CM | POA: Diagnosis not present

## 2019-09-20 DIAGNOSIS — I2510 Atherosclerotic heart disease of native coronary artery without angina pectoris: Secondary | ICD-10-CM | POA: Diagnosis not present

## 2019-09-20 DIAGNOSIS — E785 Hyperlipidemia, unspecified: Secondary | ICD-10-CM | POA: Diagnosis not present

## 2019-09-20 DIAGNOSIS — N1831 Chronic kidney disease, stage 3a: Secondary | ICD-10-CM | POA: Diagnosis not present

## 2019-09-23 ENCOUNTER — Encounter (HOSPITAL_COMMUNITY): Payer: Self-pay

## 2019-09-23 ENCOUNTER — Ambulatory Visit (HOSPITAL_COMMUNITY): Admit: 2019-09-23 | Payer: Medicare Other | Admitting: Cardiology

## 2019-09-23 SURGERY — LEFT HEART CATH AND CORONARY ANGIOGRAPHY
Anesthesia: LOCAL

## 2019-09-26 DIAGNOSIS — Z6830 Body mass index (BMI) 30.0-30.9, adult: Secondary | ICD-10-CM | POA: Diagnosis not present

## 2019-09-26 DIAGNOSIS — Z713 Dietary counseling and surveillance: Secondary | ICD-10-CM | POA: Diagnosis not present

## 2019-09-26 DIAGNOSIS — E118 Type 2 diabetes mellitus with unspecified complications: Secondary | ICD-10-CM | POA: Diagnosis not present

## 2019-10-03 DIAGNOSIS — K219 Gastro-esophageal reflux disease without esophagitis: Secondary | ICD-10-CM | POA: Diagnosis not present

## 2019-10-03 DIAGNOSIS — D509 Iron deficiency anemia, unspecified: Secondary | ICD-10-CM | POA: Diagnosis not present

## 2019-10-03 DIAGNOSIS — K222 Esophageal obstruction: Secondary | ICD-10-CM | POA: Diagnosis not present

## 2019-10-06 DIAGNOSIS — Z86010 Personal history of colonic polyps: Secondary | ICD-10-CM | POA: Diagnosis not present

## 2019-10-06 DIAGNOSIS — K648 Other hemorrhoids: Secondary | ICD-10-CM | POA: Diagnosis not present

## 2019-10-06 DIAGNOSIS — K219 Gastro-esophageal reflux disease without esophagitis: Secondary | ICD-10-CM | POA: Diagnosis not present

## 2019-10-06 DIAGNOSIS — D131 Benign neoplasm of stomach: Secondary | ICD-10-CM | POA: Diagnosis not present

## 2019-10-06 DIAGNOSIS — I10 Essential (primary) hypertension: Secondary | ICD-10-CM | POA: Diagnosis not present

## 2019-10-06 DIAGNOSIS — D649 Anemia, unspecified: Secondary | ICD-10-CM | POA: Diagnosis not present

## 2019-10-06 DIAGNOSIS — K222 Esophageal obstruction: Secondary | ICD-10-CM | POA: Diagnosis not present

## 2019-10-06 DIAGNOSIS — E119 Type 2 diabetes mellitus without complications: Secondary | ICD-10-CM | POA: Diagnosis not present

## 2019-10-06 DIAGNOSIS — D500 Iron deficiency anemia secondary to blood loss (chronic): Secondary | ICD-10-CM | POA: Diagnosis not present

## 2019-10-06 DIAGNOSIS — K317 Polyp of stomach and duodenum: Secondary | ICD-10-CM | POA: Diagnosis not present

## 2019-10-06 DIAGNOSIS — K5790 Diverticulosis of intestine, part unspecified, without perforation or abscess without bleeding: Secondary | ICD-10-CM | POA: Diagnosis not present

## 2019-10-06 DIAGNOSIS — K5730 Diverticulosis of large intestine without perforation or abscess without bleeding: Secondary | ICD-10-CM | POA: Diagnosis not present

## 2019-10-11 DIAGNOSIS — D509 Iron deficiency anemia, unspecified: Secondary | ICD-10-CM | POA: Diagnosis not present

## 2019-10-11 DIAGNOSIS — I2510 Atherosclerotic heart disease of native coronary artery without angina pectoris: Secondary | ICD-10-CM | POA: Diagnosis not present

## 2019-10-11 DIAGNOSIS — E785 Hyperlipidemia, unspecified: Secondary | ICD-10-CM | POA: Diagnosis not present

## 2019-10-11 DIAGNOSIS — N1831 Chronic kidney disease, stage 3a: Secondary | ICD-10-CM | POA: Diagnosis not present

## 2019-10-21 ENCOUNTER — Ambulatory Visit: Payer: Medicare Other | Admitting: Cardiology

## 2019-10-31 ENCOUNTER — Other Ambulatory Visit: Payer: Self-pay

## 2019-10-31 ENCOUNTER — Other Ambulatory Visit: Payer: Medicare Other | Admitting: Orthotics

## 2019-10-31 DIAGNOSIS — M2042 Other hammer toe(s) (acquired), left foot: Secondary | ICD-10-CM | POA: Diagnosis not present

## 2019-10-31 DIAGNOSIS — E1140 Type 2 diabetes mellitus with diabetic neuropathy, unspecified: Secondary | ICD-10-CM | POA: Diagnosis not present

## 2019-10-31 DIAGNOSIS — M2041 Other hammer toe(s) (acquired), right foot: Secondary | ICD-10-CM | POA: Diagnosis not present

## 2019-10-31 DIAGNOSIS — M2141 Flat foot [pes planus] (acquired), right foot: Secondary | ICD-10-CM | POA: Diagnosis not present

## 2019-10-31 DIAGNOSIS — M2142 Flat foot [pes planus] (acquired), left foot: Secondary | ICD-10-CM | POA: Diagnosis not present

## 2019-11-09 ENCOUNTER — Encounter: Payer: Self-pay | Admitting: Sports Medicine

## 2019-11-09 ENCOUNTER — Other Ambulatory Visit: Payer: Self-pay

## 2019-11-09 ENCOUNTER — Ambulatory Visit (INDEPENDENT_AMBULATORY_CARE_PROVIDER_SITE_OTHER): Payer: Medicare Other | Admitting: Sports Medicine

## 2019-11-09 DIAGNOSIS — M79675 Pain in left toe(s): Secondary | ICD-10-CM | POA: Diagnosis not present

## 2019-11-09 DIAGNOSIS — IMO0001 Reserved for inherently not codable concepts without codable children: Secondary | ICD-10-CM

## 2019-11-09 DIAGNOSIS — M79674 Pain in right toe(s): Secondary | ICD-10-CM

## 2019-11-09 DIAGNOSIS — E1141 Type 2 diabetes mellitus with diabetic mononeuropathy: Secondary | ICD-10-CM

## 2019-11-09 DIAGNOSIS — S93401A Sprain of unspecified ligament of right ankle, initial encounter: Secondary | ICD-10-CM

## 2019-11-09 DIAGNOSIS — B351 Tinea unguium: Secondary | ICD-10-CM | POA: Diagnosis not present

## 2019-11-09 NOTE — Progress Notes (Signed)
Subjective: Matthew Ferguson is a 74 y.o. male patient with history of diabetes who presents to office today complaining of long,mildly painful nails  while ambulating in shoes; unable to trim. Patient states that the glucose reading this morning was 132 mg/dl. A1C 8. PCP Truman Hayward yesterday. Patient denies any new changes in medication or new problems except he twisted his ankle on curb yesterday sharp 7/10 pain better today and does not want anything done for it.  Patient Active Problem List   Diagnosis Date Noted  . Pre-op evaluation 09/09/2019  . Coronary artery disease involving native coronary artery 05/29/2015  . Essential hypertension 05/29/2015  . Mixed hyperlipidemia 05/29/2015  . Type 2 diabetes mellitus ([place]) 05/29/2015   Current Outpatient Medications on File Prior to Visit  Medication Sig Dispense Refill  . aspirin EC 81 MG tablet Take 1 tablet (81 mg total) by mouth daily.    Marland Kitchen atorvastatin (LIPITOR) 10 MG tablet Take 10 mg by mouth daily.    Marland Kitchen dexlansoprazole (DEXILANT) 60 MG capsule Take 60 mg by mouth daily.    . Dulaglutide (TRULICITY) 3.24 MW/1.0UV SOPN Inject into the skin once a week.    . enalapril (VASOTEC) 5 MG tablet Take 5 mg by mouth daily.    . fluticasone (FLONASE) 50 MCG/ACT nasal spray Place 1 spray into both nostrils daily.     . fluticasone (FLOVENT DISKUS) 50 MCG/BLIST diskus inhaler Inhale 1 puff into the lungs 2 (two) times daily.    Marland Kitchen FREESTYLE LITE test strip     . glimepiride (AMARYL) 4 MG tablet Take 4 mg by mouth 2 (two) times daily.     . indomethacin (INDOCIN SR) 75 MG CR capsule Take 75 mg by mouth daily as needed.     . isosorbide mononitrate (IMDUR) 30 MG 24 hr tablet Take 1 tablet (30 mg total) by mouth daily. 30 tablet 3  . metFORMIN (GLUCOPHAGE) 850 MG tablet Take 850 mg by mouth 3 (three) times daily.    . metoprolol tartrate (LOPRESSOR) 25 MG tablet Take 25 mg by mouth 2 (two) times daily.    . nitroGLYCERIN (NITROSTAT) 0.4 MG SL tablet DISSOLVE 1  TABLET UNDER THE TONGUE EVERY 5 MINUTES FOR UP TO 3 DOSES AS NEEDED FOR CHEST PAIN    . sitaGLIPtin (JANUVIA) 100 MG tablet Take 100 mg by mouth daily.     No current facility-administered medications on file prior to visit.   Allergies  Allergen Reactions  . Anesthetics, Amide Other (See Comments)    UNKNOWN  . Lidocaine Other (See Comments)    UNKNOWN    Recent Results (from the past 2160 hour(s))  Comp Met (CMET)     Status: Abnormal   Collection Time: 09/09/19  4:03 PM  Result Value Ref Range   Glucose 269 (H) 65 - 99 mg/dL   BUN 12 8 - 27 mg/dL   Creatinine, Ser 1.23 0.76 - 1.27 mg/dL   GFR calc non Af Amer 57 (L) >59 mL/min/1.73   GFR calc Af Amer 66 >59 mL/min/1.73   BUN/Creatinine Ratio 10 10 - 24   Sodium 138 134 - 144 mmol/L   Potassium 4.7 3.5 - 5.2 mmol/L   Chloride 104 96 - 106 mmol/L   CO2 20 20 - 29 mmol/L   Calcium 9.5 8.6 - 10.2 mg/dL   Total Protein 6.8 6.0 - 8.5 g/dL   Albumin 4.1 3.7 - 4.7 g/dL   Globulin, Total 2.7 1.5 - 4.5 g/dL   Albumin/Globulin  Ratio 1.5 1.2 - 2.2   Bilirubin Total 0.8 0.0 - 1.2 mg/dL   Alkaline Phosphatase 114 39 - 117 IU/L   AST 41 (H) 0 - 40 IU/L   ALT 32 0 - 44 IU/L  Pro b natriuretic peptide (BNP)     Status: None   Collection Time: 09/09/19  4:03 PM  Result Value Ref Range   NT-Pro BNP 42 0 - 486 pg/mL    Comment: The following cut-points have been suggested for the use of proBNP for the diagnostic evaluation of heart failure (HF) in patients with acute dyspnea: Modality                     Age           Optimal Cut                            (years)            Point ------------------------------------------------------ Diagnosis (rule in HF)        <50            450 pg/mL                           50 - 75            900 pg/mL                               >75           1800 pg/mL Exclusion (rule out HF)  Age independent     300 pg/mL   CBC     Status: Abnormal   Collection Time: 09/09/19  4:03 PM  Result Value Ref  Range   WBC 5.5 3.4 - 10.8 x10E3/uL   RBC 3.94 (L) 4.14 - 5.80 x10E6/uL   Hemoglobin 8.8 (L) 13.0 - 17.7 g/dL   Hematocrit 29.3 (L) 37.5 - 51.0 %   MCV 74 (L) 79 - 97 fL   MCH 22.3 (L) 26.6 - 33.0 pg   MCHC 30.0 (L) 31.5 - 35.7 g/dL   RDW 17.1 (H) 11.6 - 15.4 %   Platelets 149 (L) 150 - 450 x10E3/uL  Troponin I     Status: None   Collection Time: 09/09/19  4:03 PM  Result Value Ref Range   Troponin I <0.01 0.00 - 0.04 ng/mL    Objective: General: Patient is awake, alert, and oriented x 3 and in no acute distress.  Integument: Skin is warm, dry and supple bilateral. Nails are tender, long, thickened and dystrophic with subungual debris, consistent with onychomycosis, 1-5 bilateral. No signs of infection. No open lesions or preulcerative lesions present bilateral. Remaining integument unremarkable.  Vasculature:  Dorsalis Pedis pulse 1/4 bilateral. Posterior Tibial pulse  1/4 bilateral. Capillary fill time <3 sec 1-5 bilateral. Positive hair growth to the level of the digits.Temperature gradient within normal limits. No varicosities present bilateral. No edema present bilateral.   Neurology: The patient has intact sensation measured with a 5.07/10g Semmes Weinstein Monofilament at all pedal sites bilateral . Vibratory sensation diminished bilateral with tuning fork. No Babinski sign present bilateral.   Musculoskeletal: Pain at lateral>medial R ankle, Asymptomatic pedal hammertoe and pes planus deformities noted bilateral. Muscular strength 5/5 in all lower extremity muscular groups bilateral without pain on range of motion. No  tenderness with calf compression bilateral.  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 -Discussed and educated  patient on diabetic foot care, especially with  regards to the vascular, neurological and musculoskeletal systems.  -Stressed the importance of good glycemic control and the detriment of not  controlling glucose levels in relation to the foot. -Mechanically debrided all nails 1-5 bilateral using sterile nail nipper and filed with dremel without incident  -Patient refused Xrays of Right ankle -Advised rest, ice, elevation and use of Surgigrip compression sleeve as dispensed this visit -Answered all patient questions -Patient to return  in 3 months for at risk foot care or sooner if right ankle pain is not better -Patient advised to call the office if any problems or questions arise in the meantime.  Landis Martins, DPM

## 2019-11-29 DIAGNOSIS — D509 Iron deficiency anemia, unspecified: Secondary | ICD-10-CM | POA: Diagnosis not present

## 2019-12-08 DIAGNOSIS — D509 Iron deficiency anemia, unspecified: Secondary | ICD-10-CM | POA: Diagnosis not present

## 2019-12-08 DIAGNOSIS — D131 Benign neoplasm of stomach: Secondary | ICD-10-CM | POA: Diagnosis not present

## 2019-12-08 DIAGNOSIS — K5730 Diverticulosis of large intestine without perforation or abscess without bleeding: Secondary | ICD-10-CM | POA: Diagnosis not present

## 2019-12-09 DIAGNOSIS — I2510 Atherosclerotic heart disease of native coronary artery without angina pectoris: Secondary | ICD-10-CM | POA: Diagnosis not present

## 2019-12-09 DIAGNOSIS — L309 Dermatitis, unspecified: Secondary | ICD-10-CM | POA: Diagnosis not present

## 2019-12-09 DIAGNOSIS — D509 Iron deficiency anemia, unspecified: Secondary | ICD-10-CM | POA: Diagnosis not present

## 2019-12-09 DIAGNOSIS — I10 Essential (primary) hypertension: Secondary | ICD-10-CM | POA: Diagnosis not present

## 2019-12-09 DIAGNOSIS — E785 Hyperlipidemia, unspecified: Secondary | ICD-10-CM | POA: Diagnosis not present

## 2019-12-20 ENCOUNTER — Other Ambulatory Visit: Payer: Self-pay | Admitting: *Deleted

## 2019-12-20 ENCOUNTER — Telehealth: Payer: Self-pay | Admitting: Cardiology

## 2019-12-20 MED ORDER — ISOSORBIDE MONONITRATE ER 30 MG PO TB24: 30.0000 mg | ORAL_TABLET | Freq: Every day | ORAL | 3 refills | Status: DC

## 2019-12-20 MED ORDER — ISOSORBIDE MONONITRATE ER 30 MG PO TB24: 30.0000 mg | ORAL_TABLET | Freq: Every day | ORAL | 1 refills | Status: DC

## 2019-12-20 NOTE — Telephone Encounter (Signed)
Called patient. States has lost his Imdur and is having breakthrough chest pain.Has not been taking his nitroglycerin. Instructed him to take his nitro when he has chest pain. [facility]

## 2019-12-20 NOTE — Telephone Encounter (Signed)
Patient states that he has misplaced a medication prescribed by Dr. Bettina Gavia, however the patient states he does not remember the name of the medication. Patient also states that he has been without the medication for 2 weeks, causing him to experience chest pain. Please call to advise.   Pt c/o of Chest Pain: STAT if CP now or developed within 24 hours  1. Are you having CP right now? Yes  2. Are you experiencing any other symptoms (ex. SOB, nausea, vomiting, sweating)? Dizziness, nausea and night sweats  3. How long have you been experiencing CP? 2 weeks  4. Is your CP continuous or coming and going? Coming and going  5. Have you taken Nitroglycerin? No ?

## 2019-12-28 DIAGNOSIS — K219 Gastro-esophageal reflux disease without esophagitis: Secondary | ICD-10-CM | POA: Diagnosis not present

## 2019-12-28 DIAGNOSIS — D509 Iron deficiency anemia, unspecified: Secondary | ICD-10-CM | POA: Diagnosis not present

## 2019-12-28 DIAGNOSIS — K222 Esophageal obstruction: Secondary | ICD-10-CM | POA: Diagnosis not present

## 2020-01-11 DIAGNOSIS — E785 Hyperlipidemia, unspecified: Secondary | ICD-10-CM | POA: Diagnosis not present

## 2020-01-11 DIAGNOSIS — I2510 Atherosclerotic heart disease of native coronary artery without angina pectoris: Secondary | ICD-10-CM | POA: Diagnosis not present

## 2020-01-11 DIAGNOSIS — D509 Iron deficiency anemia, unspecified: Secondary | ICD-10-CM | POA: Diagnosis not present

## 2020-01-11 DIAGNOSIS — I10 Essential (primary) hypertension: Secondary | ICD-10-CM | POA: Diagnosis not present

## 2020-01-11 DIAGNOSIS — M545 Low back pain: Secondary | ICD-10-CM | POA: Diagnosis not present

## 2020-01-11 DIAGNOSIS — E118 Type 2 diabetes mellitus with unspecified complications: Secondary | ICD-10-CM | POA: Diagnosis not present

## 2020-01-11 DIAGNOSIS — N1831 Chronic kidney disease, stage 3a: Secondary | ICD-10-CM | POA: Diagnosis not present

## 2020-01-23 NOTE — Progress Notes (Signed)
Cardiology Office Note:    Date:  01/24/2020   ID:  Matthew Ferguson, DOB 01-13-1946, MRN [id number]  PCP:  Matthew Nakai, MD  Cardiologist:  Matthew More, MD    Referring MD: Matthew Nakai, MD    ASSESSMENT:    1. Coronary artery disease of native artery of native heart with stable angina pectoris ([place])   2. Essential hypertension   3. Mixed hyperlipidemia   4. Type 2 diabetes mellitus without complication, without long-term current use of insulin (HCC)    PLAN:    In order of problems listed above:  1. Ongoing atypical symptoms plan Myoview test normal angiography.  I do not think this is typical angina and for now continue medical treatment 2. Controlled continue current medication 3. Lipids at target continue his current lipid-lowering treatment 4. Stable managed by his PCP   Next appointment: 1 year   Medication Adjustments/Labs and Tests Ordered: Current medicines are reviewed at length with the patient today.  Concerns regarding medicines are outlined above.  No orders of the defined types were placed in this encounter.  No orders of the defined types were placed in this encounter.   No chief complaint on file.   History of Present Illness:    Matthew Ferguson is a 74 y.o. male with a hx of CAD with PCI and an Taxus drug-eluting stent RCA 2013, hypertension and hyperlipidemia  He was last seen 09/09/2019. Compliance with diet, lifestyle and medications: Yes  He continues to be bothered by reflux symptoms and wonders if some of his symptoms are angina we will undergo myocardial perfusion study.  He is not having typical exertional chest pain shortness of breath palpitation or syncope is very unusual symptoms are ongoing despite antacid and PPI.  He has had a cholecystectomy. Past Medical History:  Diagnosis Date  . Coronary artery disease involving native coronary artery 05/29/2015   05/08/12 had PCI and Xience stent to RCA for Troponin normal unstable angina with residual  40-50% proximal and 30-40 % mid LAD stenosis, EF normal 55-60%  . Essential hypertension 05/29/2015  . Mixed hyperlipidemia 05/29/2015  . Type 2 diabetes mellitus ([place]) 05/29/2015    Past Surgical History:  Procedure Laterality Date  . BACK SURGERY    . CARDIAC CATHETERIZATION    . CHOLECYSTECTOMY    . CORONARY ANGIOPLASTY WITH STENT PLACEMENT    . KNEE SURGERY      Current Medications: Current Meds  Medication Sig  . aspirin EC 81 MG tablet Take 1 tablet (81 mg total) by mouth daily.  Marland Kitchen atorvastatin (LIPITOR) 10 MG tablet Take 10 mg by mouth daily.  Marland Kitchen dexlansoprazole (DEXILANT) 60 MG capsule Take 60 mg by mouth daily.  . Dulaglutide (TRULICITY) 1.77 NH/6.5BX SOPN Inject into the skin once a week.  . enalapril (VASOTEC) 5 MG tablet Take 5 mg by mouth daily.  . fluticasone (FLONASE) 50 MCG/ACT nasal spray Place 1 spray into both nostrils daily.   . fluticasone (FLOVENT DISKUS) 50 MCG/BLIST diskus inhaler Inhale 1 puff into the lungs 2 (two) times daily.  Marland Kitchen FREESTYLE LITE test strip   . glimepiride (AMARYL) 4 MG tablet Take 4 mg by mouth 2 (two) times daily.   . indomethacin (INDOCIN SR) 75 MG CR capsule Take 75 mg by mouth daily as needed.   . isosorbide mononitrate (IMDUR) 30 MG 24 hr tablet Take 1 tablet (30 mg total) by mouth daily.  . metFORMIN (GLUCOPHAGE) 850 MG tablet Take 850 mg by mouth 3 (  three) times daily.  . metoprolol tartrate (LOPRESSOR) 25 MG tablet Take 25 mg by mouth 2 (two) times daily.  . nitroGLYCERIN (NITROSTAT) 0.4 MG SL tablet DISSOLVE 1 TABLET UNDER THE TONGUE EVERY 5 MINUTES FOR UP TO 3 DOSES AS NEEDED FOR CHEST PAIN  . sitaGLIPtin (JANUVIA) 100 MG tablet Take 100 mg by mouth daily.     Allergies:   Anesthetics, amide and Lidocaine   Social History   Socioeconomic History  . Marital status: Married    Spouse name: Not on file  . Number of children: Not on file  . Years of education: Not on file  . Highest education level: Not on file  Occupational  History  . Not on file  Tobacco Use  . Smoking status: Never Smoker  . Smokeless tobacco: Never Used  Substance and Sexual Activity  . Alcohol use: Never  . Drug use: Never  . Sexual activity: Not on file  Other Topics Concern  . Not on file  Social History Narrative  . Not on file   Social Determinants of Health   Financial Resource Strain:   . Difficulty of Paying Living Expenses:   Food Insecurity:   . Worried About Charity fundraiser in the Last Year:   . Arboriculturist in the Last Year:   Transportation Needs:   . Film/video editor (Medical):   Marland Kitchen Lack of Transportation (Non-Medical):   Physical Activity:   . Days of Exercise per Week:   . Minutes of Exercise per Session:   Stress:   . Feeling of Stress :   Social Connections:   . Frequency of Communication with Friends and Family:   . Frequency of Social Gatherings with Friends and Family:   . Attends Religious Services:   . Active Member of Clubs or Organizations:   . Attends Archivist Meetings:   Marland Kitchen Marital Status:      Family History: The patient's family history includes Diabetes in his father; Stroke in his father. ROS:   Please see the history of present illness.    All other systems reviewed and are negative.  EKGs/Labs/Other Studies Reviewed:    The following studies were reviewed today:  EKG:  EKG reviewed sinus rhythm normal Recent Labs: 09/09/2019: ALT 32; BUN 12; Creatinine, Ser 1.23; Hemoglobin 8.8; NT-Pro BNP 42; Platelets 149; Potassium 4.7; Sodium 138  Recent Lipid Panel 01/11/2020 cholesterol 95 HDL 39 LDL 37  Physical Exam:    VS:  BP 134/70   Pulse 61   Ht 5\' 9"  (1.753 m)   Wt 198 lb (89.8 kg)   SpO2 97%   BMI 29.24 kg/m     Wt Readings from Last 3 Encounters:  01/24/20 198 lb (89.8 kg)  09/09/19 202 lb (91.6 kg)  07/23/18 198 lb (89.8 kg)     GEN:  Well nourished, well developed in no acute distress HEENT: Normal NECK: No JVD; No carotid  bruits LYMPHATICS: No lymphadenopathy CARDIAC: RRR, no murmurs, rubs, gallops RESPIRATORY:  Clear to auscultation without rales, wheezing or rhonchi  ABDOMEN: Soft, non-tender, non-distended MUSCULOSKELETAL:  No edema; No deformity  SKIN: Warm and dry NEUROLOGIC:  Alert and oriented x 3 PSYCHIATRIC:  Normal affect    Signed, Matthew More, MD  01/24/2020 11:47 AM    [place]

## 2020-01-24 ENCOUNTER — Encounter: Payer: Self-pay | Admitting: Cardiology

## 2020-01-24 ENCOUNTER — Ambulatory Visit (INDEPENDENT_AMBULATORY_CARE_PROVIDER_SITE_OTHER): Payer: Medicare Other | Admitting: Cardiology

## 2020-01-24 ENCOUNTER — Other Ambulatory Visit: Payer: Self-pay

## 2020-01-24 VITALS — BP 134/70 | HR 61 | Ht 69.0 in | Wt 198.0 lb

## 2020-01-24 DIAGNOSIS — I10 Essential (primary) hypertension: Secondary | ICD-10-CM | POA: Diagnosis not present

## 2020-01-24 DIAGNOSIS — I25118 Atherosclerotic heart disease of native coronary artery with other forms of angina pectoris: Secondary | ICD-10-CM

## 2020-01-24 DIAGNOSIS — E782 Mixed hyperlipidemia: Secondary | ICD-10-CM

## 2020-01-24 DIAGNOSIS — R079 Chest pain, unspecified: Secondary | ICD-10-CM

## 2020-01-24 DIAGNOSIS — E119 Type 2 diabetes mellitus without complications: Secondary | ICD-10-CM | POA: Diagnosis not present

## 2020-01-24 NOTE — Patient Instructions (Addendum)
Medication Instructions:  Your physician recommends that you continue on your current medications as directed. Please refer to the Current Medication list given to you today.  *If you need a refill on your cardiac medications before your next appointment, please call your pharmacy*   Lab Work: None If you have labs (blood work) drawn today and your tests are completely normal, you will receive your results only by: Marland Kitchen MyChart Message (if you have MyChart) OR . A paper copy in the mail If you have any lab test that is abnormal or we need to change your treatment, we will call you to review the results.   Testing/Procedures: Your physician has requested that you have a lexiscan myoview. For further information please visit [email or web address]. Please follow instruction sheet, as given.       Follow-Up: At [facility], you and your health needs are our priority.  As part of our continuing mission to provide you with exceptional heart care, we have created designated Provider Care Teams.  These Care Teams include your primary Cardiologist (physician) and Advanced Practice Providers (APPs -  Physician Assistants and Nurse Practitioners) who all work together to provide you with the care you need, when you need it.  We recommend signing up for the patient portal called "MyChart".  Sign up information is provided on this After Visit Summary.  MyChart is used to connect with patients for Virtual Visits (Telemedicine).  Patients are able to view lab/test results, encounter notes, upcoming appointments, etc.  Non-urgent messages can be sent to your provider as well.   To learn more about what you can do with MyChart, go to [email or web address].    Your next appointment:   1 year(s)  The format for your next appointment:   In Person  Provider:   Shirlee More, MD   Other Instructions

## 2020-01-25 DIAGNOSIS — K219 Gastro-esophageal reflux disease without esophagitis: Secondary | ICD-10-CM | POA: Diagnosis not present

## 2020-01-25 DIAGNOSIS — K5730 Diverticulosis of large intestine without perforation or abscess without bleeding: Secondary | ICD-10-CM | POA: Diagnosis not present

## 2020-02-02 ENCOUNTER — Telehealth: Payer: Self-pay | Admitting: *Deleted

## 2020-02-02 NOTE — Telephone Encounter (Signed)
Left message on voicemail per DPR in reference to upcoming appointment scheduled on 02/09/2020 at [facility] with detailed instructions given per Myocardial Perfusion Study Information Sheet for the test. LM to arrive 15 minutes early, and that it is imperative to arrive on time for appointment to keep from having the test rescheduled. If you need to cancel or reschedule your appointment, please call the office within 24 hours of your appointment. Failure to do so may result in a cancellation of your appointment, and a $50 no show fee. Phone number given for call back for any questions.   No mychart available.Gwen Sarvis, Ranae Palms

## 2020-02-09 ENCOUNTER — Ambulatory Visit (INDEPENDENT_AMBULATORY_CARE_PROVIDER_SITE_OTHER): Payer: Medicare Other

## 2020-02-09 ENCOUNTER — Other Ambulatory Visit: Payer: Self-pay

## 2020-02-09 VITALS — Ht 69.0 in | Wt 198.0 lb

## 2020-02-09 DIAGNOSIS — R079 Chest pain, unspecified: Secondary | ICD-10-CM

## 2020-02-09 DIAGNOSIS — I25118 Atherosclerotic heart disease of native coronary artery with other forms of angina pectoris: Secondary | ICD-10-CM | POA: Diagnosis not present

## 2020-02-09 LAB — MYOCARDIAL PERFUSION IMAGING
LV dias vol: 85 mL (ref 62–150)
LV sys vol: 27 mL
Peak HR: 86 {beats}/min
Rest HR: 61 {beats}/min
SDS: 1
SRS: 0
SSS: 1
TID: 1.04

## 2020-02-09 MED ORDER — TECHNETIUM TC 99M TETROFOSMIN IV KIT
32.6000 | PACK | Freq: Once | INTRAVENOUS | Status: AC | PRN
  Administered 2020-02-09: 32.6 via INTRAVENOUS

## 2020-02-09 MED ORDER — TECHNETIUM TC 99M TETROFOSMIN IV KIT
10.0000 | PACK | Freq: Once | INTRAVENOUS | Status: AC | PRN
  Administered 2020-02-09: 10 via INTRAVENOUS

## 2020-02-09 MED ORDER — REGADENOSON 0.4 MG/5ML IV SOLN
0.4000 mg | Freq: Once | INTRAVENOUS | Status: AC
  Administered 2020-02-09: 0.4 mg via INTRAVENOUS

## 2020-02-15 ENCOUNTER — Other Ambulatory Visit: Payer: Self-pay

## 2020-02-15 ENCOUNTER — Encounter: Payer: Self-pay | Admitting: Sports Medicine

## 2020-02-15 ENCOUNTER — Ambulatory Visit (INDEPENDENT_AMBULATORY_CARE_PROVIDER_SITE_OTHER): Payer: Medicare Other | Admitting: Sports Medicine

## 2020-02-15 DIAGNOSIS — M79674 Pain in right toe(s): Secondary | ICD-10-CM | POA: Diagnosis not present

## 2020-02-15 DIAGNOSIS — B351 Tinea unguium: Secondary | ICD-10-CM

## 2020-02-15 DIAGNOSIS — E1141 Type 2 diabetes mellitus with diabetic mononeuropathy: Secondary | ICD-10-CM

## 2020-02-15 DIAGNOSIS — M79675 Pain in left toe(s): Secondary | ICD-10-CM | POA: Diagnosis not present

## 2020-02-15 NOTE — Progress Notes (Signed)
Subjective: Matthew Ferguson is a 74 y.o. male patient with history of diabetes who presents to office today complaining of long,mildly painful nails  while ambulating in shoes; unable to trim. Patient states that the glucose reading this morning was 95 mg/dl. A1C 6.7.  PCP Truman Hayward 1 month ago.  Patient denies any new changes in medication or new problems except his feet hurting feeling like he is walking on rocks.  Denies any injury or any acute signs of infection.  Patient Active Problem List   Diagnosis Date Noted  . Pre-op evaluation 09/09/2019  . Coronary artery disease involving native coronary artery 05/29/2015  . Essential hypertension 05/29/2015  . Mixed hyperlipidemia 05/29/2015  . Type 2 diabetes mellitus ([place]) 05/29/2015   Current Outpatient Medications on File Prior to Visit  Medication Sig Dispense Refill  . aspirin EC 81 MG tablet Take 1 tablet (81 mg total) by mouth daily.    Marland Kitchen atorvastatin (LIPITOR) 10 MG tablet Take 10 mg by mouth daily.    Marland Kitchen dexlansoprazole (DEXILANT) 60 MG capsule Take 60 mg by mouth daily.    . Dulaglutide (TRULICITY) 4.58 KD/9.8PJ SOPN Inject into the skin once a week.    . enalapril (VASOTEC) 5 MG tablet Take 5 mg by mouth daily.    . fluticasone (FLONASE) 50 MCG/ACT nasal spray Place 1 spray into both nostrils daily.     . fluticasone (FLOVENT DISKUS) 50 MCG/BLIST diskus inhaler Inhale 1 puff into the lungs 2 (two) times daily.    Marland Kitchen FREESTYLE LITE test strip     . glimepiride (AMARYL) 4 MG tablet Take 4 mg by mouth 2 (two) times daily.     . indomethacin (INDOCIN SR) 75 MG CR capsule Take 75 mg by mouth daily as needed.     . isosorbide mononitrate (IMDUR) 30 MG 24 hr tablet Take 1 tablet (30 mg total) by mouth daily. 90 tablet 1  . metFORMIN (GLUCOPHAGE) 850 MG tablet Take 850 mg by mouth 3 (three) times daily.    . metoprolol tartrate (LOPRESSOR) 25 MG tablet Take 25 mg by mouth 2 (two) times daily.    . nitroGLYCERIN (NITROSTAT) 0.4 MG SL tablet DISSOLVE 1  TABLET UNDER THE TONGUE EVERY 5 MINUTES FOR UP TO 3 DOSES AS NEEDED FOR CHEST PAIN    . sitaGLIPtin (JANUVIA) 100 MG tablet Take 100 mg by mouth daily.     No current facility-administered medications on file prior to visit.   Allergies  Allergen Reactions  . Anesthetics, Amide Other (See Comments)    UNKNOWN  . Lidocaine Other (See Comments)    UNKNOWN    Recent Results (from the past 2160 hour(s))  Myocardial Perfusion Imaging     Status: None   Collection Time: 02/09/20 10:32 AM  Result Value Ref Range   Rest HR 61 bpm   Rest BP 180/80 mmHg   Peak HR 86 bpm   Peak BP 171/66 mmHg   SSS 1    SRS 0    SDS 1    TID 1.04    LV sys vol 27 mL   LV dias vol 85 62 - 150 mL    Objective: General: Patient is awake, alert, and oriented x 3 and in no acute distress.  Integument: Skin is warm, dry and supple bilateral. Nails are tender, long, thickened and dystrophic with subungual debris, consistent with onychomycosis, 1-5 bilateral. No signs of infection. No open lesions or preulcerative lesions present bilateral. Remaining integument unremarkable.  Vasculature:  Dorsalis Pedis pulse 1/4 bilateral. Posterior Tibial pulse  1/4 bilateral. Capillary fill time <3 sec 1-5 bilateral. Positive hair growth to the level of the digits.Temperature gradient within normal limits. No varicosities present bilateral. No edema present bilateral.   Neurology: The patient has intact sensation measured with a 5.07/10g Semmes Weinstein Monofilament at all pedal sites bilateral . Vibratory sensation diminished bilateral with tuning fork. No Babinski sign present bilateral.  Subjective diffuse pain to bottom of feet feels like he is walking on rocks.  Musculoskeletal: Subjective diffuse pain to the bottoms of both feet feels like he is walking on rocks, asymptomatic pedal hammertoe and pes planus deformities noted bilateral. Muscular strength 5/5 in all lower extremity muscular groups bilateral without pain on  range of motion. No tenderness with calf compression bilateral.  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 -Re-Discussed and educated patient on diabetic foot care, especially with  regards to the vascular, neurological and musculoskeletal systems.  -Mechanically debrided all nails 1-5 bilateral using sterile nail nipper and filed with dremel without incident  -Advised patient that pain he is having on both feet awkward sensation feeling like he is walking on rocks is likely from his diabetes and a result of the neuropathy -Advised continue with over-the-counter insoles and may also try topical pain cream or rub to the areas -Answered all patient questions -Patient to return  in 3 months for at risk foot care or sooner if right ankle pain is not better -Patient advised to call the office if any problems or questions arise in the meantime.  Landis Martins, DPM

## 2020-03-07 DIAGNOSIS — K219 Gastro-esophageal reflux disease without esophagitis: Secondary | ICD-10-CM | POA: Diagnosis not present

## 2020-03-07 DIAGNOSIS — D509 Iron deficiency anemia, unspecified: Secondary | ICD-10-CM | POA: Diagnosis not present

## 2020-04-04 DIAGNOSIS — D509 Iron deficiency anemia, unspecified: Secondary | ICD-10-CM | POA: Diagnosis not present

## 2020-05-16 ENCOUNTER — Encounter: Payer: Self-pay | Admitting: Sports Medicine

## 2020-05-16 ENCOUNTER — Ambulatory Visit (INDEPENDENT_AMBULATORY_CARE_PROVIDER_SITE_OTHER): Payer: Medicare Other | Admitting: Sports Medicine

## 2020-05-16 ENCOUNTER — Other Ambulatory Visit: Payer: Self-pay

## 2020-05-16 DIAGNOSIS — M79674 Pain in right toe(s): Secondary | ICD-10-CM | POA: Diagnosis not present

## 2020-05-16 DIAGNOSIS — E1141 Type 2 diabetes mellitus with diabetic mononeuropathy: Secondary | ICD-10-CM | POA: Diagnosis not present

## 2020-05-16 DIAGNOSIS — B351 Tinea unguium: Secondary | ICD-10-CM

## 2020-05-16 DIAGNOSIS — M79675 Pain in left toe(s): Secondary | ICD-10-CM | POA: Diagnosis not present

## 2020-05-16 NOTE — Progress Notes (Signed)
Subjective: Matthew Ferguson is a 74 y.o. male patient with history of diabetes who presents to office today complaining of long,mildly painful nails  while ambulating in shoes; unable to trim. Patient states that the glucose reading this morning was 117 mg/dl. A1C 6.6.  PCP Truman Hayward 1 week ago.  Patient denies any new changes in medication or new problems except his feet still hurting feels like he is walking on rocks.  No other pedal complaints noted.  Patient Active Problem List   Diagnosis Date Noted  . Pre-op evaluation 09/09/2019  . Coronary artery disease involving native coronary artery 05/29/2015  . Essential hypertension 05/29/2015  . Mixed hyperlipidemia 05/29/2015  . Type 2 diabetes mellitus ([place]) 05/29/2015   Current Outpatient Medications on File Prior to Visit  Medication Sig Dispense Refill  . aspirin EC 81 MG tablet Take 1 tablet (81 mg total) by mouth daily.    Marland Kitchen atorvastatin (LIPITOR) 10 MG tablet Take 10 mg by mouth daily.    Marland Kitchen dexlansoprazole (DEXILANT) 60 MG capsule Take 60 mg by mouth daily.    Marland Kitchen dicyclomine (BENTYL) 10 MG capsule Take by mouth.    . Dulaglutide (TRULICITY) 7.89 FY/1.0FB SOPN Inject into the skin once a week.    . enalapril (VASOTEC) 5 MG tablet Take 5 mg by mouth daily.    . fluticasone (FLONASE) 50 MCG/ACT nasal spray Place 1 spray into both nostrils daily.     . fluticasone (FLOVENT DISKUS) 50 MCG/BLIST diskus inhaler Inhale 1 puff into the lungs 2 (two) times daily.    Marland Kitchen FREESTYLE LITE test strip     . glimepiride (AMARYL) 4 MG tablet Take 4 mg by mouth 2 (two) times daily.     . indomethacin (INDOCIN SR) 75 MG CR capsule Take 75 mg by mouth daily as needed.     . isosorbide mononitrate (IMDUR) 30 MG 24 hr tablet Take 1 tablet (30 mg total) by mouth daily. 90 tablet 1  . metFORMIN (GLUCOPHAGE) 850 MG tablet Take 850 mg by mouth 3 (three) times daily.    . metoprolol tartrate (LOPRESSOR) 25 MG tablet Take 25 mg by mouth 2 (two) times daily.    .  nitroGLYCERIN (NITROSTAT) 0.4 MG SL tablet DISSOLVE 1 TABLET UNDER THE TONGUE EVERY 5 MINUTES FOR UP TO 3 DOSES AS NEEDED FOR CHEST PAIN    . sitaGLIPtin (JANUVIA) 100 MG tablet Take 100 mg by mouth daily.     No current facility-administered medications on file prior to visit.   Allergies  Allergen Reactions  . Anesthetics, Amide Other (See Comments)    UNKNOWN  . Lidocaine Other (See Comments)    UNKNOWN    No results found for this or any previous visit (from the past 2160 hour(s)).  Objective: General: Patient is awake, alert, and oriented x 3 and in no acute distress.  Integument: Skin is warm, dry and supple bilateral. Nails are tender, long, thickened and dystrophic with subungual debris, consistent with onychomycosis, 1-5 bilateral. No signs of infection. No open lesions or preulcerative lesions present bilateral. Remaining integument unremarkable.  Vasculature:  Dorsalis Pedis pulse 1/4 bilateral. Posterior Tibial pulse  1/4 bilateral. Capillary fill time <3 sec 1-5 bilateral. Positive hair growth to the level of the digits.Temperature gradient within normal limits. Minimal varicosities present bilateral. No edema present bilateral.   Neurology:  Subjective diffuse pain to bottom of feet feels like he is walking on rocks.  Musculoskeletal:  Asymptomatic pedal hammertoe and pes planus deformities noted  bilateral. Muscular strength 5/5 in all lower extremity muscular groups bilateral without pain on range of except pain/feeling like walking on rocks.  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 -Re-Discussed and educated patient on diabetic foot care, especially with  regards to the vascular, neurological and musculoskeletal systems.  -Mechanically debrided all nails 1-5 bilateral using  sterile nail nipper and filed with dremel without incident  -Rediscussed with patient signs and symptoms of neuropathy at this time patient wants to hold off on any nerve medicine at this time -Answered all patient questions -Patient to return  in 3 months for at risk foot care  -Patient advised to call the office if any problems or questions arise in the meantime.  Landis Martins, DPM

## 2020-05-25 DIAGNOSIS — I2510 Atherosclerotic heart disease of native coronary artery without angina pectoris: Secondary | ICD-10-CM | POA: Diagnosis not present

## 2020-05-25 DIAGNOSIS — R110 Nausea: Secondary | ICD-10-CM | POA: Diagnosis not present

## 2020-05-25 DIAGNOSIS — G8929 Other chronic pain: Secondary | ICD-10-CM | POA: Diagnosis not present

## 2020-05-25 DIAGNOSIS — D696 Thrombocytopenia, unspecified: Secondary | ICD-10-CM | POA: Diagnosis not present

## 2020-05-25 DIAGNOSIS — K219 Gastro-esophageal reflux disease without esophagitis: Secondary | ICD-10-CM | POA: Diagnosis not present

## 2020-05-25 DIAGNOSIS — M545 Low back pain: Secondary | ICD-10-CM | POA: Diagnosis not present

## 2020-05-25 DIAGNOSIS — R945 Abnormal results of liver function studies: Secondary | ICD-10-CM | POA: Diagnosis not present

## 2020-05-25 DIAGNOSIS — J309 Allergic rhinitis, unspecified: Secondary | ICD-10-CM | POA: Diagnosis not present

## 2020-05-25 DIAGNOSIS — R197 Diarrhea, unspecified: Secondary | ICD-10-CM | POA: Diagnosis not present

## 2020-05-25 DIAGNOSIS — M109 Gout, unspecified: Secondary | ICD-10-CM | POA: Diagnosis not present

## 2020-05-25 DIAGNOSIS — K589 Irritable bowel syndrome without diarrhea: Secondary | ICD-10-CM | POA: Diagnosis not present

## 2020-05-25 DIAGNOSIS — N1831 Chronic kidney disease, stage 3a: Secondary | ICD-10-CM | POA: Diagnosis not present

## 2020-05-30 ENCOUNTER — Other Ambulatory Visit: Payer: Self-pay | Admitting: Cardiology

## 2020-06-01 DIAGNOSIS — E663 Overweight: Secondary | ICD-10-CM | POA: Diagnosis not present

## 2020-06-01 DIAGNOSIS — M545 Low back pain: Secondary | ICD-10-CM | POA: Diagnosis not present

## 2020-06-01 DIAGNOSIS — D509 Iron deficiency anemia, unspecified: Secondary | ICD-10-CM | POA: Diagnosis not present

## 2020-06-01 DIAGNOSIS — K219 Gastro-esophageal reflux disease without esophagitis: Secondary | ICD-10-CM | POA: Diagnosis not present

## 2020-06-01 DIAGNOSIS — K589 Irritable bowel syndrome without diarrhea: Secondary | ICD-10-CM | POA: Diagnosis not present

## 2020-06-01 DIAGNOSIS — J309 Allergic rhinitis, unspecified: Secondary | ICD-10-CM | POA: Diagnosis not present

## 2020-06-01 DIAGNOSIS — R945 Abnormal results of liver function studies: Secondary | ICD-10-CM | POA: Diagnosis not present

## 2020-06-01 DIAGNOSIS — G8929 Other chronic pain: Secondary | ICD-10-CM | POA: Diagnosis not present

## 2020-06-01 DIAGNOSIS — I2510 Atherosclerotic heart disease of native coronary artery without angina pectoris: Secondary | ICD-10-CM | POA: Diagnosis not present

## 2020-06-01 DIAGNOSIS — M109 Gout, unspecified: Secondary | ICD-10-CM | POA: Diagnosis not present

## 2020-06-01 DIAGNOSIS — R110 Nausea: Secondary | ICD-10-CM | POA: Diagnosis not present

## 2020-06-01 DIAGNOSIS — N1831 Chronic kidney disease, stage 3a: Secondary | ICD-10-CM | POA: Diagnosis not present

## 2020-06-05 ENCOUNTER — Other Ambulatory Visit: Payer: Medicare Other

## 2020-06-05 DIAGNOSIS — D509 Iron deficiency anemia, unspecified: Secondary | ICD-10-CM | POA: Diagnosis not present

## 2020-06-29 DIAGNOSIS — D509 Iron deficiency anemia, unspecified: Secondary | ICD-10-CM | POA: Diagnosis not present

## 2020-06-29 DIAGNOSIS — I2510 Atherosclerotic heart disease of native coronary artery without angina pectoris: Secondary | ICD-10-CM | POA: Diagnosis not present

## 2020-06-29 DIAGNOSIS — M545 Low back pain: Secondary | ICD-10-CM | POA: Diagnosis not present

## 2020-06-29 DIAGNOSIS — J309 Allergic rhinitis, unspecified: Secondary | ICD-10-CM | POA: Diagnosis not present

## 2020-06-29 DIAGNOSIS — G8929 Other chronic pain: Secondary | ICD-10-CM | POA: Diagnosis not present

## 2020-06-29 DIAGNOSIS — R110 Nausea: Secondary | ICD-10-CM | POA: Diagnosis not present

## 2020-06-29 DIAGNOSIS — N1831 Chronic kidney disease, stage 3a: Secondary | ICD-10-CM | POA: Diagnosis not present

## 2020-06-29 DIAGNOSIS — K589 Irritable bowel syndrome without diarrhea: Secondary | ICD-10-CM | POA: Diagnosis not present

## 2020-06-29 DIAGNOSIS — R945 Abnormal results of liver function studies: Secondary | ICD-10-CM | POA: Diagnosis not present

## 2020-06-29 DIAGNOSIS — M109 Gout, unspecified: Secondary | ICD-10-CM | POA: Diagnosis not present

## 2020-06-29 DIAGNOSIS — K219 Gastro-esophageal reflux disease without esophagitis: Secondary | ICD-10-CM | POA: Diagnosis not present

## 2020-06-29 DIAGNOSIS — R197 Diarrhea, unspecified: Secondary | ICD-10-CM | POA: Diagnosis not present

## 2020-07-03 DIAGNOSIS — D509 Iron deficiency anemia, unspecified: Secondary | ICD-10-CM | POA: Diagnosis not present

## 2020-07-12 DIAGNOSIS — D509 Iron deficiency anemia, unspecified: Secondary | ICD-10-CM | POA: Diagnosis not present

## 2020-07-13 DIAGNOSIS — R197 Diarrhea, unspecified: Secondary | ICD-10-CM | POA: Diagnosis not present

## 2020-07-13 DIAGNOSIS — G8929 Other chronic pain: Secondary | ICD-10-CM | POA: Diagnosis not present

## 2020-07-13 DIAGNOSIS — N1831 Chronic kidney disease, stage 3a: Secondary | ICD-10-CM | POA: Diagnosis not present

## 2020-07-13 DIAGNOSIS — I2510 Atherosclerotic heart disease of native coronary artery without angina pectoris: Secondary | ICD-10-CM | POA: Diagnosis not present

## 2020-07-13 DIAGNOSIS — M109 Gout, unspecified: Secondary | ICD-10-CM | POA: Diagnosis not present

## 2020-07-13 DIAGNOSIS — K219 Gastro-esophageal reflux disease without esophagitis: Secondary | ICD-10-CM | POA: Diagnosis not present

## 2020-07-13 DIAGNOSIS — M545 Low back pain: Secondary | ICD-10-CM | POA: Diagnosis not present

## 2020-07-13 DIAGNOSIS — I10 Essential (primary) hypertension: Secondary | ICD-10-CM | POA: Diagnosis not present

## 2020-07-13 DIAGNOSIS — Z139 Encounter for screening, unspecified: Secondary | ICD-10-CM | POA: Diagnosis not present

## 2020-07-13 DIAGNOSIS — R110 Nausea: Secondary | ICD-10-CM | POA: Diagnosis not present

## 2020-07-13 DIAGNOSIS — K589 Irritable bowel syndrome without diarrhea: Secondary | ICD-10-CM | POA: Diagnosis not present

## 2020-07-13 DIAGNOSIS — R945 Abnormal results of liver function studies: Secondary | ICD-10-CM | POA: Diagnosis not present

## 2020-07-16 DIAGNOSIS — Z23 Encounter for immunization: Secondary | ICD-10-CM | POA: Diagnosis not present

## 2020-07-16 DIAGNOSIS — D509 Iron deficiency anemia, unspecified: Secondary | ICD-10-CM | POA: Diagnosis not present

## 2020-07-27 ENCOUNTER — Ambulatory Visit: Payer: Medicare Other | Admitting: Orthotics

## 2020-07-27 ENCOUNTER — Other Ambulatory Visit: Payer: Self-pay

## 2020-07-27 DIAGNOSIS — IMO0001 Reserved for inherently not codable concepts without codable children: Secondary | ICD-10-CM

## 2020-07-27 DIAGNOSIS — E1141 Type 2 diabetes mellitus with diabetic mononeuropathy: Secondary | ICD-10-CM

## 2020-07-27 DIAGNOSIS — R110 Nausea: Secondary | ICD-10-CM | POA: Diagnosis not present

## 2020-07-27 DIAGNOSIS — G8929 Other chronic pain: Secondary | ICD-10-CM | POA: Diagnosis not present

## 2020-07-27 DIAGNOSIS — K589 Irritable bowel syndrome without diarrhea: Secondary | ICD-10-CM | POA: Diagnosis not present

## 2020-07-27 DIAGNOSIS — I10 Essential (primary) hypertension: Secondary | ICD-10-CM | POA: Diagnosis not present

## 2020-07-27 DIAGNOSIS — M545 Low back pain: Secondary | ICD-10-CM | POA: Diagnosis not present

## 2020-07-27 DIAGNOSIS — K219 Gastro-esophageal reflux disease without esophagitis: Secondary | ICD-10-CM | POA: Diagnosis not present

## 2020-07-27 DIAGNOSIS — M109 Gout, unspecified: Secondary | ICD-10-CM | POA: Diagnosis not present

## 2020-07-27 DIAGNOSIS — Z9181 History of falling: Secondary | ICD-10-CM | POA: Diagnosis not present

## 2020-07-27 DIAGNOSIS — R945 Abnormal results of liver function studies: Secondary | ICD-10-CM | POA: Diagnosis not present

## 2020-07-27 DIAGNOSIS — Z1331 Encounter for screening for depression: Secondary | ICD-10-CM | POA: Diagnosis not present

## 2020-07-27 DIAGNOSIS — N1831 Chronic kidney disease, stage 3a: Secondary | ICD-10-CM | POA: Diagnosis not present

## 2020-07-27 DIAGNOSIS — I2510 Atherosclerotic heart disease of native coronary artery without angina pectoris: Secondary | ICD-10-CM | POA: Diagnosis not present

## 2020-07-27 DIAGNOSIS — M79674 Pain in right toe(s): Secondary | ICD-10-CM

## 2020-07-27 NOTE — Progress Notes (Signed)

## 2020-07-31 DIAGNOSIS — D509 Iron deficiency anemia, unspecified: Secondary | ICD-10-CM | POA: Diagnosis not present

## 2020-08-14 DIAGNOSIS — E1140 Type 2 diabetes mellitus with diabetic neuropathy, unspecified: Secondary | ICD-10-CM | POA: Diagnosis not present

## 2020-08-14 DIAGNOSIS — E785 Hyperlipidemia, unspecified: Secondary | ICD-10-CM | POA: Diagnosis not present

## 2020-08-14 DIAGNOSIS — R945 Abnormal results of liver function studies: Secondary | ICD-10-CM | POA: Diagnosis not present

## 2020-08-14 DIAGNOSIS — J309 Allergic rhinitis, unspecified: Secondary | ICD-10-CM | POA: Diagnosis not present

## 2020-08-14 DIAGNOSIS — I2510 Atherosclerotic heart disease of native coronary artery without angina pectoris: Secondary | ICD-10-CM | POA: Diagnosis not present

## 2020-08-14 DIAGNOSIS — R110 Nausea: Secondary | ICD-10-CM | POA: Diagnosis not present

## 2020-08-14 DIAGNOSIS — K219 Gastro-esophageal reflux disease without esophagitis: Secondary | ICD-10-CM | POA: Diagnosis not present

## 2020-08-14 DIAGNOSIS — K589 Irritable bowel syndrome without diarrhea: Secondary | ICD-10-CM | POA: Diagnosis not present

## 2020-08-14 DIAGNOSIS — G8929 Other chronic pain: Secondary | ICD-10-CM | POA: Diagnosis not present

## 2020-08-14 DIAGNOSIS — M109 Gout, unspecified: Secondary | ICD-10-CM | POA: Diagnosis not present

## 2020-08-14 DIAGNOSIS — M545 Low back pain, unspecified: Secondary | ICD-10-CM | POA: Diagnosis not present

## 2020-08-14 DIAGNOSIS — N1831 Chronic kidney disease, stage 3a: Secondary | ICD-10-CM | POA: Diagnosis not present

## 2020-08-21 ENCOUNTER — Other Ambulatory Visit: Payer: Self-pay

## 2020-08-21 ENCOUNTER — Encounter: Payer: Self-pay | Admitting: Sports Medicine

## 2020-08-21 ENCOUNTER — Ambulatory Visit (INDEPENDENT_AMBULATORY_CARE_PROVIDER_SITE_OTHER): Payer: Medicare Other | Admitting: Sports Medicine

## 2020-08-21 DIAGNOSIS — M79674 Pain in right toe(s): Secondary | ICD-10-CM

## 2020-08-21 DIAGNOSIS — M79675 Pain in left toe(s): Secondary | ICD-10-CM

## 2020-08-21 DIAGNOSIS — E1141 Type 2 diabetes mellitus with diabetic mononeuropathy: Secondary | ICD-10-CM | POA: Diagnosis not present

## 2020-08-21 DIAGNOSIS — B351 Tinea unguium: Secondary | ICD-10-CM

## 2020-08-21 NOTE — Progress Notes (Signed)
Subjective: Matthew Ferguson is a 74 y.o. male patient with history of diabetes who presents to office today complaining of long,mildly painful nails  while ambulating in shoes; unable to trim. Patient states that the glucose reading was good but has an increase in belly pain and swelling that has not gotten better.  No other issues noted.  Patient Active Problem List   Diagnosis Date Noted  . Pre-op evaluation 09/09/2019  . Coronary artery disease involving native coronary artery 05/29/2015  . Essential hypertension 05/29/2015  . Mixed hyperlipidemia 05/29/2015  . Type 2 diabetes mellitus ([place]) 05/29/2015   Current Outpatient Medications on File Prior to Visit  Medication Sig Dispense Refill  . aspirin EC 81 MG tablet Take 1 tablet (81 mg total) by mouth daily.    Marland Kitchen atorvastatin (LIPITOR) 10 MG tablet Take 10 mg by mouth daily.    Marland Kitchen dexlansoprazole (DEXILANT) 60 MG capsule Take 60 mg by mouth daily.    Marland Kitchen dicyclomine (BENTYL) 10 MG capsule Take by mouth.    . Dulaglutide (TRULICITY) 2.44 WN/0.2VO SOPN Inject into the skin once a week.    . enalapril (VASOTEC) 5 MG tablet Take 5 mg by mouth daily.    . fluticasone (FLONASE) 50 MCG/ACT nasal spray Place 1 spray into both nostrils daily.     . fluticasone (FLOVENT DISKUS) 50 MCG/BLIST diskus inhaler Inhale 1 puff into the lungs 2 (two) times daily.    Marland Kitchen FREESTYLE LITE test strip     . glimepiride (AMARYL) 4 MG tablet Take 4 mg by mouth 2 (two) times daily.     . indomethacin (INDOCIN SR) 75 MG CR capsule Take 75 mg by mouth daily as needed.     . isosorbide mononitrate (IMDUR) 30 MG 24 hr tablet Take 1 tablet (30 mg total) by mouth daily. 90 tablet 3  . metFORMIN (GLUCOPHAGE) 850 MG tablet Take 850 mg by mouth 3 (three) times daily.    . metoprolol tartrate (LOPRESSOR) 25 MG tablet Take 25 mg by mouth 2 (two) times daily.    . nitroGLYCERIN (NITROSTAT) 0.4 MG SL tablet DISSOLVE 1 TABLET UNDER THE TONGUE EVERY 5 MINUTES FOR UP TO 3 DOSES AS NEEDED  FOR CHEST PAIN    . sitaGLIPtin (JANUVIA) 100 MG tablet Take 100 mg by mouth daily.     No current facility-administered medications on file prior to visit.   Allergies  Allergen Reactions  . Anesthetics, Amide Other (See Comments)    UNKNOWN  . Lidocaine Other (See Comments)    UNKNOWN    No results found for this or any previous visit (from the past 2160 hour(s)).  Objective: General: Patient is awake, alert, and oriented x 3 and in no acute distress.  Integument: Skin is warm, dry and supple bilateral. Nails are tender, long, thickened and dystrophic with subungual debris, consistent with onychomycosis, 1-5 bilateral. No signs of infection. No open lesions or preulcerative lesions present bilateral. Remaining integument unremarkable.  Vasculature:  Dorsalis Pedis pulse 1/4 bilateral. Posterior Tibial pulse  1/4 bilateral. Capillary fill time <3 sec 1-5 bilateral. Positive hair growth to the level of the digits.Temperature gradient within normal limits. Minimal varicosities present bilateral. No edema present bilateral.   Neurology:  Subjective pain to the bottoms of both feet related to neuritis.  Musculoskeletal:  Asymptomatic pedal hammertoe and pes planus deformities noted bilateral. Muscular strength 5/5 in all lower extremity muscular groups bilateral without pain on range of except pain/feeling like walking on rocks as not  as intense especially since he has been dealing with belly pain.  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 -Re-Discussed and educated patient on diabetic foot care, especially with  regards to the vascular, neurological and musculoskeletal systems.  -Mechanically debrided all nails 1-5 bilateral using sterile nail nipper and filed with dremel without incident   -Answered all patient questions -Patient to return  in 3 months for at risk foot care  -Patient advised to call the office if any problems or questions arise in the meantime.  Advised PCP follow-up for belly bloating and pain.  Landis Martins, DPM

## 2020-08-27 DIAGNOSIS — R140 Abdominal distension (gaseous): Secondary | ICD-10-CM | POA: Diagnosis not present

## 2020-08-27 DIAGNOSIS — K5900 Constipation, unspecified: Secondary | ICD-10-CM | POA: Diagnosis not present

## 2020-08-27 DIAGNOSIS — K921 Melena: Secondary | ICD-10-CM | POA: Diagnosis not present

## 2020-08-27 DIAGNOSIS — D510 Vitamin B12 deficiency anemia due to intrinsic factor deficiency: Secondary | ICD-10-CM | POA: Diagnosis not present

## 2020-08-27 DIAGNOSIS — R1900 Intra-abdominal and pelvic swelling, mass and lump, unspecified site: Secondary | ICD-10-CM | POA: Diagnosis not present

## 2020-08-27 DIAGNOSIS — K219 Gastro-esophageal reflux disease without esophagitis: Secondary | ICD-10-CM | POA: Diagnosis not present

## 2020-08-27 DIAGNOSIS — R1084 Generalized abdominal pain: Secondary | ICD-10-CM | POA: Diagnosis not present

## 2020-08-27 DIAGNOSIS — R1032 Left lower quadrant pain: Secondary | ICD-10-CM | POA: Diagnosis not present

## 2020-08-27 DIAGNOSIS — R109 Unspecified abdominal pain: Secondary | ICD-10-CM | POA: Diagnosis not present

## 2020-08-30 DIAGNOSIS — R935 Abnormal findings on diagnostic imaging of other abdominal regions, including retroperitoneum: Secondary | ICD-10-CM | POA: Diagnosis not present

## 2020-08-30 DIAGNOSIS — K7460 Unspecified cirrhosis of liver: Secondary | ICD-10-CM | POA: Diagnosis not present

## 2020-08-30 DIAGNOSIS — R188 Other ascites: Secondary | ICD-10-CM | POA: Diagnosis not present

## 2020-09-11 DIAGNOSIS — G8929 Other chronic pain: Secondary | ICD-10-CM | POA: Diagnosis not present

## 2020-09-11 DIAGNOSIS — E118 Type 2 diabetes mellitus with unspecified complications: Secondary | ICD-10-CM | POA: Diagnosis not present

## 2020-09-11 DIAGNOSIS — E1140 Type 2 diabetes mellitus with diabetic neuropathy, unspecified: Secondary | ICD-10-CM | POA: Diagnosis not present

## 2020-09-11 DIAGNOSIS — D509 Iron deficiency anemia, unspecified: Secondary | ICD-10-CM | POA: Diagnosis not present

## 2020-09-11 DIAGNOSIS — R945 Abnormal results of liver function studies: Secondary | ICD-10-CM | POA: Diagnosis not present

## 2020-09-11 DIAGNOSIS — E785 Hyperlipidemia, unspecified: Secondary | ICD-10-CM | POA: Diagnosis not present

## 2020-09-11 DIAGNOSIS — R197 Diarrhea, unspecified: Secondary | ICD-10-CM | POA: Diagnosis not present

## 2020-09-11 DIAGNOSIS — N1831 Chronic kidney disease, stage 3a: Secondary | ICD-10-CM | POA: Diagnosis not present

## 2020-09-11 DIAGNOSIS — K589 Irritable bowel syndrome without diarrhea: Secondary | ICD-10-CM | POA: Diagnosis not present

## 2020-09-11 DIAGNOSIS — I2510 Atherosclerotic heart disease of native coronary artery without angina pectoris: Secondary | ICD-10-CM | POA: Diagnosis not present

## 2020-09-11 DIAGNOSIS — M545 Low back pain, unspecified: Secondary | ICD-10-CM | POA: Diagnosis not present

## 2020-09-11 DIAGNOSIS — K219 Gastro-esophageal reflux disease without esophagitis: Secondary | ICD-10-CM | POA: Diagnosis not present

## 2020-09-11 DIAGNOSIS — R110 Nausea: Secondary | ICD-10-CM | POA: Diagnosis not present

## 2020-09-11 DIAGNOSIS — M109 Gout, unspecified: Secondary | ICD-10-CM | POA: Diagnosis not present

## 2020-09-11 DIAGNOSIS — I10 Essential (primary) hypertension: Secondary | ICD-10-CM | POA: Diagnosis not present

## 2020-09-14 DIAGNOSIS — K589 Irritable bowel syndrome without diarrhea: Secondary | ICD-10-CM | POA: Diagnosis not present

## 2020-09-14 DIAGNOSIS — E118 Type 2 diabetes mellitus with unspecified complications: Secondary | ICD-10-CM | POA: Diagnosis not present

## 2020-09-14 DIAGNOSIS — E1140 Type 2 diabetes mellitus with diabetic neuropathy, unspecified: Secondary | ICD-10-CM | POA: Diagnosis not present

## 2020-09-14 DIAGNOSIS — K7469 Other cirrhosis of liver: Secondary | ICD-10-CM | POA: Diagnosis not present

## 2020-09-14 DIAGNOSIS — I10 Essential (primary) hypertension: Secondary | ICD-10-CM | POA: Diagnosis not present

## 2020-09-14 DIAGNOSIS — E785 Hyperlipidemia, unspecified: Secondary | ICD-10-CM | POA: Diagnosis not present

## 2020-09-14 DIAGNOSIS — G8929 Other chronic pain: Secondary | ICD-10-CM | POA: Diagnosis not present

## 2020-09-14 DIAGNOSIS — R197 Diarrhea, unspecified: Secondary | ICD-10-CM | POA: Diagnosis not present

## 2020-09-14 DIAGNOSIS — M545 Low back pain, unspecified: Secondary | ICD-10-CM | POA: Diagnosis not present

## 2020-09-14 DIAGNOSIS — I2510 Atherosclerotic heart disease of native coronary artery without angina pectoris: Secondary | ICD-10-CM | POA: Diagnosis not present

## 2020-09-14 DIAGNOSIS — N1831 Chronic kidney disease, stage 3a: Secondary | ICD-10-CM | POA: Diagnosis not present

## 2020-09-14 DIAGNOSIS — I959 Hypotension, unspecified: Secondary | ICD-10-CM | POA: Diagnosis not present

## 2020-09-19 DIAGNOSIS — D509 Iron deficiency anemia, unspecified: Secondary | ICD-10-CM | POA: Diagnosis not present

## 2020-09-24 DIAGNOSIS — K589 Irritable bowel syndrome without diarrhea: Secondary | ICD-10-CM | POA: Diagnosis not present

## 2020-09-24 DIAGNOSIS — E118 Type 2 diabetes mellitus with unspecified complications: Secondary | ICD-10-CM | POA: Diagnosis not present

## 2020-09-24 DIAGNOSIS — I2510 Atherosclerotic heart disease of native coronary artery without angina pectoris: Secondary | ICD-10-CM | POA: Diagnosis not present

## 2020-09-24 DIAGNOSIS — E785 Hyperlipidemia, unspecified: Secondary | ICD-10-CM | POA: Diagnosis not present

## 2020-09-24 DIAGNOSIS — G8929 Other chronic pain: Secondary | ICD-10-CM | POA: Diagnosis not present

## 2020-09-24 DIAGNOSIS — R110 Nausea: Secondary | ICD-10-CM | POA: Diagnosis not present

## 2020-09-24 DIAGNOSIS — R197 Diarrhea, unspecified: Secondary | ICD-10-CM | POA: Diagnosis not present

## 2020-09-24 DIAGNOSIS — K7469 Other cirrhosis of liver: Secondary | ICD-10-CM | POA: Diagnosis not present

## 2020-09-24 DIAGNOSIS — N1831 Chronic kidney disease, stage 3a: Secondary | ICD-10-CM | POA: Diagnosis not present

## 2020-09-24 DIAGNOSIS — M545 Low back pain, unspecified: Secondary | ICD-10-CM | POA: Diagnosis not present

## 2020-09-24 DIAGNOSIS — I10 Essential (primary) hypertension: Secondary | ICD-10-CM | POA: Diagnosis not present

## 2020-09-24 DIAGNOSIS — E1140 Type 2 diabetes mellitus with diabetic neuropathy, unspecified: Secondary | ICD-10-CM | POA: Diagnosis not present

## 2020-10-11 DIAGNOSIS — D649 Anemia, unspecified: Secondary | ICD-10-CM | POA: Diagnosis not present

## 2020-10-11 DIAGNOSIS — K7460 Unspecified cirrhosis of liver: Secondary | ICD-10-CM | POA: Diagnosis not present

## 2020-10-18 ENCOUNTER — Ambulatory Visit (INDEPENDENT_AMBULATORY_CARE_PROVIDER_SITE_OTHER): Payer: Medicare Other | Admitting: Orthotics

## 2020-10-18 ENCOUNTER — Other Ambulatory Visit: Payer: Self-pay

## 2020-10-18 DIAGNOSIS — E1141 Type 2 diabetes mellitus with diabetic mononeuropathy: Secondary | ICD-10-CM

## 2020-10-18 DIAGNOSIS — M2041 Other hammer toe(s) (acquired), right foot: Secondary | ICD-10-CM | POA: Diagnosis not present

## 2020-10-18 DIAGNOSIS — M2142 Flat foot [pes planus] (acquired), left foot: Secondary | ICD-10-CM | POA: Diagnosis not present

## 2020-10-18 DIAGNOSIS — M2042 Other hammer toe(s) (acquired), left foot: Secondary | ICD-10-CM | POA: Diagnosis not present

## 2020-10-18 DIAGNOSIS — M2141 Flat foot [pes planus] (acquired), right foot: Secondary | ICD-10-CM | POA: Diagnosis not present

## 2020-10-18 DIAGNOSIS — E1140 Type 2 diabetes mellitus with diabetic neuropathy, unspecified: Secondary | ICD-10-CM | POA: Diagnosis not present

## 2020-10-29 DIAGNOSIS — D509 Iron deficiency anemia, unspecified: Secondary | ICD-10-CM | POA: Diagnosis not present

## 2020-10-29 DIAGNOSIS — K7460 Unspecified cirrhosis of liver: Secondary | ICD-10-CM | POA: Diagnosis not present

## 2020-10-29 DIAGNOSIS — R1310 Dysphagia, unspecified: Secondary | ICD-10-CM | POA: Diagnosis not present

## 2020-10-29 DIAGNOSIS — D649 Anemia, unspecified: Secondary | ICD-10-CM | POA: Diagnosis not present

## 2020-10-29 DIAGNOSIS — K5730 Diverticulosis of large intestine without perforation or abscess without bleeding: Secondary | ICD-10-CM | POA: Diagnosis not present

## 2020-11-15 ENCOUNTER — Encounter: Payer: Self-pay | Admitting: Sports Medicine

## 2020-11-15 DIAGNOSIS — D649 Anemia, unspecified: Secondary | ICD-10-CM | POA: Diagnosis not present

## 2020-11-15 DIAGNOSIS — K7460 Unspecified cirrhosis of liver: Secondary | ICD-10-CM | POA: Diagnosis not present

## 2020-11-21 ENCOUNTER — Ambulatory Visit: Payer: Medicare Other | Admitting: Sports Medicine

## 2020-11-28 DIAGNOSIS — K7460 Unspecified cirrhosis of liver: Secondary | ICD-10-CM | POA: Diagnosis not present

## 2020-11-28 DIAGNOSIS — D509 Iron deficiency anemia, unspecified: Secondary | ICD-10-CM | POA: Diagnosis not present

## 2020-12-04 DIAGNOSIS — K429 Umbilical hernia without obstruction or gangrene: Secondary | ICD-10-CM | POA: Diagnosis not present

## 2020-12-04 DIAGNOSIS — K4020 Bilateral inguinal hernia, without obstruction or gangrene, not specified as recurrent: Secondary | ICD-10-CM | POA: Diagnosis not present

## 2020-12-07 ENCOUNTER — Telehealth: Payer: Self-pay

## 2020-12-07 NOTE — Telephone Encounter (Signed)
   Primary Cardiologist: Shirlee More, MD  Chart reviewed as part of pre-operative protocol coverage. Because of Matthew Ferguson's past medical history and time since last visit, he will require a follow-up visit in order to better assess preoperative cardiovascular risk.  Pre-op covering staff: - Please schedule appointment and call patient to inform them. Please add "pre-op clearance" to the appointment notes so provider is aware. - Please contact requesting surgeon's office via preferred method (i.e, phone, fax) to inform them of need for appointment prior to surgery.   Abigail Butts, PA-C  12/07/2020, 5:15 PM

## 2020-12-07 NOTE — Telephone Encounter (Signed)
   [facility] Pre-operative Risk Assessment    HEARTCARE STAFF: - Please ensure there is not already an duplicate clearance open for this procedure. - Under Visit Info/Reason for Call, type in Other and utilize the format Clearance MM/DD/YY or Clearance TBD. Do not use dashes or single digits. - If request is for dental extraction, please clarify the # of teeth to be extracted.  Request for surgical clearance:  1. What type of surgery is being performed? Open repair of Bilateral inguinal hernia and umbilical hernia with mesh and paracentesis for liver ascites.    2. When is this surgery scheduled? 01/09/2021   3. What type of clearance is required (medical clearance vs. Pharmacy clearance to hold med vs. Both)? Both  4. Are there any medications that need to be held prior to surgery and how long? None specified    5. Practice name and name of physician performing surgery?  [facility]. Dr. Orrin Brigham   6. What is the office phone number? [phone]   7.   What is the office fax number? [phone]  8.   Anesthesia type (None, local, MAC, general) ? General   Gita Kudo 12/07/2020, 4:19 PM  _________________________________________________________________   (provider comments below)

## 2020-12-10 ENCOUNTER — Telehealth: Payer: Self-pay

## 2020-12-10 NOTE — Progress Notes (Unsigned)
Cardiology Office Note:    Date:  12/11/2020   ID:  Matthew Ferguson, DOB 01-30-1946, MRN [id number]  PCP:  Cher Nakai, MD  Cardiologist:  Shirlee More, MD    Referring MD: Cher Nakai, MD    ASSESSMENT:    1. Preoperative cardiovascular examination   2. Essential hypertension   3. Coronary artery disease of native artery of native heart with stable angina pectoris ([place])   4. Mixed hyperlipidemia   5. Type 2 diabetes mellitus without complication, without long-term current use of insulin ([facility])    PLAN:    In order of problems listed above:  1. His coronary artery disease is stable New York Heart Association class I having no angina my opinion is optimized for his elective herniorrhaphy.  He is not on aspirin I would resume it with his liver disease continue his usual cardiac medications including loop and distal diuretic beta-blocker.  I anticipate he will sleep overnight in the hospital any problems call my group to see him 2. Stable BP at target continue current treatment predominantly diuretics 3. Stable CAD New York Heart Association class I 4. Lipids are ideal I will reduce his statin dose to 50% with his liver disease 5. Stable diabetes   Next appointment: 6 months   Medication Adjustments/Labs and Tests Ordered: Current medicines are reviewed at length with the patient today.  Concerns regarding medicines are outlined above.  Orders Placed This Encounter  Procedures  . EKG 12-Lead   No orders of the defined types were placed in this encounter.   No chief complaint on file.   History of Present Illness:    Matthew Ferguson is a 75 y.o. male with a hx of CAD with PCI to the right coronary artery 2013 hypertension hyperlipidemia and type 2 diabetes last seen 01/24/2020.  Compliance with diet, lifestyle and medications: Yes  He is no longer on aspirin and thought of restarting but he has portal hypertension I think we should hold and antiplatelet therapy. He has had  multiple paracenteses performed. He relates he has multiple hernias and is scheduled for elective surgery 01/09/2021. Presently no edema shortness of breath chest pain palpitation or syncope.  CTA abdomen and pelvis 08/27/2020 showed cirrhosis with portal venous hypertension ascites and portal venous collaterals. Past Medical History:  Diagnosis Date  . Coronary artery disease involving native coronary artery 05/29/2015   05/08/12 had PCI and Xience stent to RCA for Troponin normal unstable angina with residual 40-50% proximal and 30-40 % mid LAD stenosis, EF normal 55-60%  . Essential hypertension 05/29/2015  . Mixed hyperlipidemia 05/29/2015  . Type 2 diabetes mellitus ([place]) 05/29/2015    Past Surgical History:  Procedure Laterality Date  . BACK SURGERY    . CARDIAC CATHETERIZATION    . CHOLECYSTECTOMY    . CORONARY ANGIOPLASTY WITH STENT PLACEMENT    . KNEE SURGERY      Current Medications: Current Meds  Medication Sig  . atorvastatin (LIPITOR) 10 MG tablet Take 10 mg by mouth daily.  . furosemide (LASIX) 40 MG tablet Take 40 mg by mouth daily.  . isosorbide mononitrate (IMDUR) 30 MG 24 hr tablet Take 1 tablet (30 mg total) by mouth daily.  . metoprolol tartrate (LOPRESSOR) 25 MG tablet Take 25 mg by mouth 2 (two) times daily.  . nitroGLYCERIN (NITROSTAT) 0.4 MG SL tablet DISSOLVE 1 TABLET UNDER THE TONGUE EVERY 5 MINUTES FOR UP TO 3 DOSES AS NEEDED FOR CHEST PAIN  . pantoprazole (PROTONIX) 40 MG  tablet Take 40 mg by mouth daily.  Marland Kitchen spironolactone (ALDACTONE) 50 MG tablet Take 50 mg by mouth daily.     Allergies:   Anesthetics, amide and Lidocaine   Social History   Socioeconomic History  . Marital status: Married    Spouse name: Not on file  . Number of children: Not on file  . Years of education: Not on file  . Highest education level: Not on file  Occupational History  . Not on file  Tobacco Use  . Smoking status: Never Smoker  . Smokeless tobacco: Never Used   Vaping Use  . Vaping Use: Never used  Substance and Sexual Activity  . Alcohol use: Never  . Drug use: Never  . Sexual activity: Not on file  Other Topics Concern  . Not on file  Social History Narrative  . Not on file   Social Determinants of Health   Financial Resource Strain: Not on file  Food Insecurity: Not on file  Transportation Needs: Not on file  Physical Activity: Not on file  Stress: Not on file  Social Connections: Not on file     Family History: The patient's family history includes Diabetes in his father; Stroke in his father. ROS:   Please see the history of present illness.    All other systems reviewed and are negative.  EKGs/Labs/Other Studies Reviewed:    The following studies were reviewed today:  EKG:  EKG ordered today and personally reviewed.  The ekg ordered today demonstrates sinus rhythm and is normal  Recent Labs: 09/11/2020 cholesterol 86 LDL 35 triglycerides 76 HDL 35 A1c 6.4% creatinine 1.54  Physical Exam:    VS:  BP 118/66   Pulse 61   Ht 5\' 9"  (1.753 m)   Wt 167 lb 9.6 oz (76 kg)   SpO2 98%   BMI 24.75 kg/m     Wt Readings from Last 3 Encounters:  12/11/20 167 lb 9.6 oz (76 kg)  02/09/20 198 lb (89.8 kg)  01/24/20 198 lb (89.8 kg)     GEN: He does not look chronically ill or debilitated well nourished, well developed in no acute distress HEENT: Normal NECK: No JVD; No carotid bruits LYMPHATICS: No lymphadenopathy CARDIAC: RRR, no murmurs, rubs, gallops RESPIRATORY:  Clear to auscultation without rales, wheezing or rhonchi  ABDOMEN: Soft, non-tender, non-distended MUSCULOSKELETAL:  No edema; No deformity  SKIN: Warm and dry NEUROLOGIC:  Alert and oriented x 3 PSYCHIATRIC:  Normal affect    Signed, Shirlee More, MD  12/11/2020 4:11 PM    [facility]

## 2020-12-10 NOTE — Telephone Encounter (Signed)
Left message for patient to call office . patient will need to schedule appointment for pre-op clearence

## 2020-12-10 NOTE — Telephone Encounter (Signed)
Spoke with patients wife Pat scheduled appointment with Dr. Bettina Gavia for December 11, 2020

## 2020-12-11 ENCOUNTER — Ambulatory Visit (INDEPENDENT_AMBULATORY_CARE_PROVIDER_SITE_OTHER): Payer: Medicare Other | Admitting: Cardiology

## 2020-12-11 ENCOUNTER — Other Ambulatory Visit: Payer: Self-pay

## 2020-12-11 ENCOUNTER — Encounter: Payer: Self-pay | Admitting: Cardiology

## 2020-12-11 VITALS — BP 118/66 | HR 61 | Ht 69.0 in | Wt 167.6 lb

## 2020-12-11 DIAGNOSIS — I25118 Atherosclerotic heart disease of native coronary artery with other forms of angina pectoris: Secondary | ICD-10-CM

## 2020-12-11 DIAGNOSIS — E119 Type 2 diabetes mellitus without complications: Secondary | ICD-10-CM

## 2020-12-11 DIAGNOSIS — I10 Essential (primary) hypertension: Secondary | ICD-10-CM

## 2020-12-11 DIAGNOSIS — E782 Mixed hyperlipidemia: Secondary | ICD-10-CM | POA: Diagnosis not present

## 2020-12-11 DIAGNOSIS — Z01810 Encounter for preprocedural cardiovascular examination: Secondary | ICD-10-CM | POA: Diagnosis not present

## 2020-12-11 NOTE — Patient Instructions (Signed)

## 2020-12-12 ENCOUNTER — Ambulatory Visit (INDEPENDENT_AMBULATORY_CARE_PROVIDER_SITE_OTHER): Payer: Medicare Other | Admitting: Sports Medicine

## 2020-12-12 ENCOUNTER — Other Ambulatory Visit: Payer: Self-pay

## 2020-12-12 ENCOUNTER — Encounter: Payer: Self-pay | Admitting: Sports Medicine

## 2020-12-12 DIAGNOSIS — M79674 Pain in right toe(s): Secondary | ICD-10-CM | POA: Diagnosis not present

## 2020-12-12 DIAGNOSIS — E1141 Type 2 diabetes mellitus with diabetic mononeuropathy: Secondary | ICD-10-CM | POA: Diagnosis not present

## 2020-12-12 DIAGNOSIS — B351 Tinea unguium: Secondary | ICD-10-CM | POA: Diagnosis not present

## 2020-12-12 DIAGNOSIS — M79675 Pain in left toe(s): Secondary | ICD-10-CM

## 2020-12-12 NOTE — Progress Notes (Signed)
Subjective: Matthew Ferguson is a 75 y.o. male patient with history of diabetes who presents to office today complaining of long,mildly painful nails  while ambulating in shoes; unable to trim. Patient states that the glucose reading was 130 and last A1c 6.6.  Last visit to PCP was 1 month ago.  Patient reports that he will be undergoing surgery for his colon with general surgery on the 22nd of this month.  No other issues noted.  Patient Active Problem List   Diagnosis Date Noted  . Pre-op evaluation 09/09/2019  . Coronary artery disease involving native coronary artery 05/29/2015  . Essential hypertension 05/29/2015  . Mixed hyperlipidemia 05/29/2015  . Type 2 diabetes mellitus ([place]) 05/29/2015   Current Outpatient Medications on File Prior to Visit  Medication Sig Dispense Refill  . atorvastatin (LIPITOR) 10 MG tablet Take 10 mg by mouth daily.    . furosemide (LASIX) 40 MG tablet Take 40 mg by mouth daily.    . isosorbide mononitrate (IMDUR) 30 MG 24 hr tablet Take 1 tablet (30 mg total) by mouth daily. 90 tablet 3  . metoprolol tartrate (LOPRESSOR) 25 MG tablet Take 25 mg by mouth 2 (two) times daily.    . nitroGLYCERIN (NITROSTAT) 0.4 MG SL tablet DISSOLVE 1 TABLET UNDER THE TONGUE EVERY 5 MINUTES FOR UP TO 3 DOSES AS NEEDED FOR CHEST PAIN    . pantoprazole (PROTONIX) 40 MG tablet Take 40 mg by mouth daily.    Marland Kitchen spironolactone (ALDACTONE) 50 MG tablet Take 50 mg by mouth daily.     No current facility-administered medications on file prior to visit.   Allergies  Allergen Reactions  . Anesthetics, Amide Other (See Comments)    UNKNOWN  . Lidocaine Other (See Comments)    UNKNOWN    No results found for this or any previous visit (from the past 2160 hour(s)).  Objective: General: Patient is awake, alert, and oriented x 3 and in no acute distress.  Integument: Skin is warm, dry and supple bilateral. Nails are minimally elongated thickened and dystrophic with subungual debris,  consistent with onychomycosis, 1-5 bilateral. No signs of infection. No open lesions or preulcerative lesions present bilateral. Remaining integument unremarkable.  Vasculature:  Dorsalis Pedis pulse 1/4 bilateral. Posterior Tibial pulse  1/4 bilateral. Capillary fill time <3 sec 1-5 bilateral. Positive hair growth to the level of the digits.Temperature gradient within normal limits. Minimal varicosities present bilateral. No edema present bilateral.   Neurology:  Subjective pain to the bottoms of both feet related to neuritis.  Musculoskeletal:  Asymptomatic pedal hammertoe and pes planus deformities noted bilateral. Muscular strength 5/5 in all lower extremity muscular groups bilateral without pain on range of except pain/feeling like walking on rocks as not as intense especially since he has been dealing with belly pain.  Assessment and Plan: Problem List Items Addressed This Visit   None   Visit Diagnoses    Pain due to onychomycosis of toenails of both feet    -  Primary   Neuritis due to diabetes mellitus ([place])          -Examined patient. -Re-Discussed and educated patient on diabetic foot care -Mechanically debrided all nails 1-5 bilateral using sterile nail nipper and filed with dremel without incident  -Answered all patient questions -Patient to return  in 3 months for at risk foot care   Landis Martins, DPM

## 2020-12-25 DIAGNOSIS — D509 Iron deficiency anemia, unspecified: Secondary | ICD-10-CM | POA: Diagnosis not present

## 2021-01-01 HISTORY — PX: INGUINAL HERNIA REPAIR: SUR1180

## 2021-01-03 DIAGNOSIS — Z1152 Encounter for screening for COVID-19: Secondary | ICD-10-CM | POA: Diagnosis not present

## 2021-01-03 DIAGNOSIS — Z1159 Encounter for screening for other viral diseases: Secondary | ICD-10-CM | POA: Diagnosis not present

## 2021-01-07 ENCOUNTER — Other Ambulatory Visit: Payer: Self-pay

## 2021-01-07 ENCOUNTER — Telehealth: Payer: Self-pay | Admitting: Cardiology

## 2021-01-07 MED ORDER — NITROGLYCERIN 0.4 MG SL SUBL: SUBLINGUAL_TABLET | SUBLINGUAL | 1 refills | Status: DC

## 2021-01-07 NOTE — Telephone Encounter (Signed)
Nitroglycerin approved per Dr. Geraldo Pitter and Rx sent

## 2021-01-07 NOTE — Telephone Encounter (Signed)
*  STAT* If patient is at the pharmacy, call can be transferred to refill team.   1. Which medications need to be refilled? (please list name of each medication and dose if known) nitroGLYCERIN (NITROSTAT) 0.4 MG SL tablet  2. Which pharmacy/location (including street and city if local pharmacy) is medication to be sent to? [facility], [place]  3. Do they need a 30 day or 90 day supply? [place]

## 2021-01-07 NOTE — Telephone Encounter (Signed)
Request sent to Dr. Geraldo Pitter in the absence of Dr. Bettina Gavia for approval.

## 2021-01-09 DIAGNOSIS — K7460 Unspecified cirrhosis of liver: Secondary | ICD-10-CM | POA: Diagnosis not present

## 2021-01-09 DIAGNOSIS — E1140 Type 2 diabetes mellitus with diabetic neuropathy, unspecified: Secondary | ICD-10-CM | POA: Diagnosis not present

## 2021-01-09 DIAGNOSIS — R188 Other ascites: Secondary | ICD-10-CM | POA: Diagnosis not present

## 2021-01-09 DIAGNOSIS — K4020 Bilateral inguinal hernia, without obstruction or gangrene, not specified as recurrent: Secondary | ICD-10-CM | POA: Diagnosis not present

## 2021-01-09 DIAGNOSIS — K429 Umbilical hernia without obstruction or gangrene: Secondary | ICD-10-CM | POA: Diagnosis not present

## 2021-01-09 DIAGNOSIS — K766 Portal hypertension: Secondary | ICD-10-CM | POA: Diagnosis not present

## 2021-01-10 DIAGNOSIS — E1140 Type 2 diabetes mellitus with diabetic neuropathy, unspecified: Secondary | ICD-10-CM | POA: Diagnosis not present

## 2021-01-10 DIAGNOSIS — K4020 Bilateral inguinal hernia, without obstruction or gangrene, not specified as recurrent: Secondary | ICD-10-CM | POA: Diagnosis not present

## 2021-01-10 DIAGNOSIS — K766 Portal hypertension: Secondary | ICD-10-CM | POA: Diagnosis not present

## 2021-01-10 DIAGNOSIS — R188 Other ascites: Secondary | ICD-10-CM | POA: Diagnosis not present

## 2021-01-10 DIAGNOSIS — K7460 Unspecified cirrhosis of liver: Secondary | ICD-10-CM | POA: Diagnosis not present

## 2021-01-10 DIAGNOSIS — K429 Umbilical hernia without obstruction or gangrene: Secondary | ICD-10-CM | POA: Diagnosis not present

## 2021-01-16 DIAGNOSIS — Z09 Encounter for follow-up examination after completed treatment for conditions other than malignant neoplasm: Secondary | ICD-10-CM | POA: Diagnosis not present

## 2021-01-23 DIAGNOSIS — K219 Gastro-esophageal reflux disease without esophagitis: Secondary | ICD-10-CM | POA: Diagnosis not present

## 2021-01-23 DIAGNOSIS — K5730 Diverticulosis of large intestine without perforation or abscess without bleeding: Secondary | ICD-10-CM | POA: Diagnosis not present

## 2021-02-19 DIAGNOSIS — K219 Gastro-esophageal reflux disease without esophagitis: Secondary | ICD-10-CM | POA: Diagnosis not present

## 2021-03-12 ENCOUNTER — Encounter: Payer: Self-pay | Admitting: Sports Medicine

## 2021-03-12 ENCOUNTER — Ambulatory Visit (INDEPENDENT_AMBULATORY_CARE_PROVIDER_SITE_OTHER): Payer: Medicare Other | Admitting: Sports Medicine

## 2021-03-12 ENCOUNTER — Other Ambulatory Visit: Payer: Self-pay

## 2021-03-12 DIAGNOSIS — B351 Tinea unguium: Secondary | ICD-10-CM

## 2021-03-12 DIAGNOSIS — M79675 Pain in left toe(s): Secondary | ICD-10-CM | POA: Diagnosis not present

## 2021-03-12 DIAGNOSIS — E1141 Type 2 diabetes mellitus with diabetic mononeuropathy: Secondary | ICD-10-CM

## 2021-03-12 DIAGNOSIS — M79674 Pain in right toe(s): Secondary | ICD-10-CM

## 2021-03-12 NOTE — Progress Notes (Signed)
Subjective: Matthew Ferguson is a 75 y.o. male patient with history of diabetes who presents to office today complaining of long,mildly painful nails  while ambulating in shoes; unable to trim. Patient states that the glucose reading 160 last A1c 6 and reports that he does currently not have a PCP and is looking for a new one.  No other issues noted.  Patient Active Problem List   Diagnosis Date Noted  . Pre-op evaluation 09/09/2019  . Coronary artery disease involving native coronary artery 05/29/2015  . Essential hypertension 05/29/2015  . Mixed hyperlipidemia 05/29/2015  . Type 2 diabetes mellitus ([place]) 05/29/2015   Current Outpatient Medications on File Prior to Visit  Medication Sig Dispense Refill  . atorvastatin (LIPITOR) 10 MG tablet Take 10 mg by mouth daily.    . furosemide (LASIX) 40 MG tablet Take 40 mg by mouth daily.    . isosorbide mononitrate (IMDUR) 30 MG 24 hr tablet Take 1 tablet (30 mg total) by mouth daily. 90 tablet 3  . metoprolol tartrate (LOPRESSOR) 25 MG tablet Take 25 mg by mouth 2 (two) times daily.    . nitroGLYCERIN (NITROSTAT) 0.4 MG SL tablet DISSOLVE 1 TABLET UNDER THE TONGUE EVERY 5 MINUTES FOR UP TO 3 DOSES AS NEEDED FOR CHEST PAIN 25 tablet 1  . pantoprazole (PROTONIX) 40 MG tablet Take 40 mg by mouth daily.    Marland Kitchen spironolactone (ALDACTONE) 50 MG tablet Take 50 mg by mouth daily.     No current facility-administered medications on file prior to visit.   Allergies  Allergen Reactions  . Anesthetics, Amide Other (See Comments)    UNKNOWN  . Lidocaine Other (See Comments)    UNKNOWN    No results found for this or any previous visit (from the past 2160 hour(s)).  Objective: General: Patient is awake, alert, and oriented x 3 and in no acute distress.  Integument: Skin is warm, dry and supple bilateral. Nails are minimally elongated thickened and dystrophic with subungual debris, consistent with onychomycosis, 1-5 bilateral. No signs of infection. No open  lesions or preulcerative lesions present bilateral. Remaining integument unremarkable.  Vasculature:  Dorsalis Pedis pulse 1/4 bilateral. Posterior Tibial pulse  1/4 bilateral. Capillary fill time <3 sec 1-5 bilateral. Positive hair growth to the level of the digits.Temperature gradient within normal limits. Minimal varicosities present bilateral. No edema present bilateral.   Neurology:  Subjective pain to the bottoms of both feet related to neuritis.  Musculoskeletal:  Asymptomatic pedal hammertoe and pes planus deformities noted bilateral. Muscular strength 5/5 in all lower extremity muscular groups bilateral without pain on range of except pain/feeling like walking on rocks as not as intense especially since he has been dealing with belly pain.  Assessment and Plan: Problem List Items Addressed This Visit   None   Visit Diagnoses    Pain due to onychomycosis of toenails of both feet    -  Primary   Neuritis due to diabetes mellitus ([place])          -Examined patient. -Re-Discussed and educated patient on diabetic foot care -Mechanically debrided all nails 1-5 bilateral using sterile nail nipper and filed with dremel without incident  -Answered all patient questions -Patient to return  in 3 months for at risk foot care   Landis Martins, DPM

## 2021-03-19 DIAGNOSIS — D509 Iron deficiency anemia, unspecified: Secondary | ICD-10-CM | POA: Diagnosis not present

## 2021-03-25 DIAGNOSIS — E1169 Type 2 diabetes mellitus with other specified complication: Secondary | ICD-10-CM | POA: Diagnosis not present

## 2021-03-25 DIAGNOSIS — Z6825 Body mass index (BMI) 25.0-25.9, adult: Secondary | ICD-10-CM | POA: Diagnosis not present

## 2021-03-25 DIAGNOSIS — G629 Polyneuropathy, unspecified: Secondary | ICD-10-CM | POA: Diagnosis not present

## 2021-03-25 DIAGNOSIS — E782 Mixed hyperlipidemia: Secondary | ICD-10-CM | POA: Diagnosis not present

## 2021-03-25 DIAGNOSIS — I10 Essential (primary) hypertension: Secondary | ICD-10-CM | POA: Diagnosis not present

## 2021-03-28 DIAGNOSIS — R899 Unspecified abnormal finding in specimens from other organs, systems and tissues: Secondary | ICD-10-CM | POA: Diagnosis not present

## 2021-03-29 DIAGNOSIS — Z20822 Contact with and (suspected) exposure to covid-19: Secondary | ICD-10-CM | POA: Diagnosis not present

## 2021-03-29 DIAGNOSIS — N179 Acute kidney failure, unspecified: Secondary | ICD-10-CM | POA: Diagnosis not present

## 2021-03-29 DIAGNOSIS — R5383 Other fatigue: Secondary | ICD-10-CM | POA: Diagnosis not present

## 2021-03-29 DIAGNOSIS — I10 Essential (primary) hypertension: Secondary | ICD-10-CM | POA: Diagnosis not present

## 2021-03-29 DIAGNOSIS — E875 Hyperkalemia: Secondary | ICD-10-CM | POA: Diagnosis not present

## 2021-03-29 DIAGNOSIS — M1990 Unspecified osteoarthritis, unspecified site: Secondary | ICD-10-CM | POA: Diagnosis not present

## 2021-03-29 DIAGNOSIS — D649 Anemia, unspecified: Secondary | ICD-10-CM | POA: Diagnosis not present

## 2021-03-29 DIAGNOSIS — R634 Abnormal weight loss: Secondary | ICD-10-CM | POA: Diagnosis not present

## 2021-03-29 DIAGNOSIS — I252 Old myocardial infarction: Secondary | ICD-10-CM | POA: Diagnosis not present

## 2021-03-30 DIAGNOSIS — E875 Hyperkalemia: Secondary | ICD-10-CM | POA: Diagnosis not present

## 2021-03-30 DIAGNOSIS — N179 Acute kidney failure, unspecified: Secondary | ICD-10-CM | POA: Diagnosis not present

## 2021-03-30 DIAGNOSIS — D649 Anemia, unspecified: Secondary | ICD-10-CM | POA: Diagnosis not present

## 2021-03-31 ENCOUNTER — Encounter (HOSPITAL_COMMUNITY): Payer: Self-pay | Admitting: Internal Medicine

## 2021-03-31 ENCOUNTER — Inpatient Hospital Stay (HOSPITAL_COMMUNITY)
Admission: AD | Admit: 2021-03-31 | Discharge: 2021-04-02 | DRG: 812 | Disposition: A | Discharge status: Home / Self Care | Payer: Medicare Other | Source: Other Acute Inpatient Hospital | Attending: Student | Admitting: Student

## 2021-03-31 ENCOUNTER — Observation Stay (HOSPITAL_COMMUNITY): Payer: Medicare Other

## 2021-03-31 ENCOUNTER — Other Ambulatory Visit: Payer: Self-pay

## 2021-03-31 DIAGNOSIS — Z66 Do not resuscitate: Secondary | ICD-10-CM | POA: Diagnosis not present

## 2021-03-31 DIAGNOSIS — E871 Hypo-osmolality and hyponatremia: Secondary | ICD-10-CM | POA: Diagnosis not present

## 2021-03-31 DIAGNOSIS — N179 Acute kidney failure, unspecified: Secondary | ICD-10-CM | POA: Diagnosis present

## 2021-03-31 DIAGNOSIS — D62 Acute posthemorrhagic anemia: Secondary | ICD-10-CM | POA: Diagnosis present

## 2021-03-31 DIAGNOSIS — Z955 Presence of coronary angioplasty implant and graft: Secondary | ICD-10-CM | POA: Diagnosis not present

## 2021-03-31 DIAGNOSIS — E872 Acidosis: Secondary | ICD-10-CM | POA: Diagnosis present

## 2021-03-31 DIAGNOSIS — Z79899 Other long term (current) drug therapy: Secondary | ICD-10-CM

## 2021-03-31 DIAGNOSIS — Z833 Family history of diabetes mellitus: Secondary | ICD-10-CM

## 2021-03-31 DIAGNOSIS — Z9049 Acquired absence of other specified parts of digestive tract: Secondary | ICD-10-CM

## 2021-03-31 DIAGNOSIS — I8510 Secondary esophageal varices without bleeding: Secondary | ICD-10-CM | POA: Diagnosis present

## 2021-03-31 DIAGNOSIS — E875 Hyperkalemia: Secondary | ICD-10-CM | POA: Diagnosis present

## 2021-03-31 DIAGNOSIS — E1122 Type 2 diabetes mellitus with diabetic chronic kidney disease: Secondary | ICD-10-CM | POA: Diagnosis present

## 2021-03-31 DIAGNOSIS — K219 Gastro-esophageal reflux disease without esophagitis: Secondary | ICD-10-CM | POA: Diagnosis present

## 2021-03-31 DIAGNOSIS — K766 Portal hypertension: Secondary | ICD-10-CM | POA: Diagnosis not present

## 2021-03-31 DIAGNOSIS — N1832 Chronic kidney disease, stage 3b: Secondary | ICD-10-CM | POA: Diagnosis not present

## 2021-03-31 DIAGNOSIS — I2510 Atherosclerotic heart disease of native coronary artery without angina pectoris: Secondary | ICD-10-CM | POA: Diagnosis present

## 2021-03-31 DIAGNOSIS — B1920 Unspecified viral hepatitis C without hepatic coma: Secondary | ICD-10-CM | POA: Diagnosis not present

## 2021-03-31 DIAGNOSIS — K5730 Diverticulosis of large intestine without perforation or abscess without bleeding: Secondary | ICD-10-CM

## 2021-03-31 DIAGNOSIS — I10 Essential (primary) hypertension: Secondary | ICD-10-CM | POA: Diagnosis present

## 2021-03-31 DIAGNOSIS — K3189 Other diseases of stomach and duodenum: Secondary | ICD-10-CM | POA: Diagnosis present

## 2021-03-31 DIAGNOSIS — D649 Anemia, unspecified: Secondary | ICD-10-CM | POA: Diagnosis present

## 2021-03-31 DIAGNOSIS — I700 Atherosclerosis of aorta: Secondary | ICD-10-CM | POA: Diagnosis present

## 2021-03-31 DIAGNOSIS — K648 Other hemorrhoids: Secondary | ICD-10-CM | POA: Diagnosis not present

## 2021-03-31 DIAGNOSIS — K921 Melena: Secondary | ICD-10-CM | POA: Diagnosis present

## 2021-03-31 DIAGNOSIS — Z8249 Family history of ischemic heart disease and other diseases of the circulatory system: Secondary | ICD-10-CM

## 2021-03-31 DIAGNOSIS — R188 Other ascites: Secondary | ICD-10-CM | POA: Diagnosis present

## 2021-03-31 DIAGNOSIS — K7460 Unspecified cirrhosis of liver: Secondary | ICD-10-CM | POA: Diagnosis not present

## 2021-03-31 DIAGNOSIS — K589 Irritable bowel syndrome without diarrhea: Secondary | ICD-10-CM | POA: Diagnosis not present

## 2021-03-31 DIAGNOSIS — I129 Hypertensive chronic kidney disease with stage 1 through stage 4 chronic kidney disease, or unspecified chronic kidney disease: Secondary | ICD-10-CM | POA: Diagnosis not present

## 2021-03-31 DIAGNOSIS — K625 Hemorrhage of anus and rectum: Secondary | ICD-10-CM | POA: Diagnosis present

## 2021-03-31 DIAGNOSIS — D6959 Other secondary thrombocytopenia: Secondary | ICD-10-CM | POA: Diagnosis not present

## 2021-03-31 DIAGNOSIS — E119 Type 2 diabetes mellitus without complications: Secondary | ICD-10-CM

## 2021-03-31 DIAGNOSIS — E782 Mixed hyperlipidemia: Secondary | ICD-10-CM | POA: Diagnosis present

## 2021-03-31 DIAGNOSIS — Z884 Allergy status to anesthetic agent status: Secondary | ICD-10-CM

## 2021-03-31 DIAGNOSIS — C220 Liver cell carcinoma: Secondary | ICD-10-CM

## 2021-03-31 HISTORY — DX: Unspecified cirrhosis of liver: K74.60

## 2021-03-31 HISTORY — DX: Unspecified viral hepatitis C without hepatic coma: B19.20

## 2021-03-31 HISTORY — DX: Chronic kidney disease, unspecified: N18.9

## 2021-03-31 HISTORY — DX: Other complications of anesthesia, initial encounter: T88.59XA

## 2021-03-31 HISTORY — DX: Irritable bowel syndrome, unspecified: K58.9

## 2021-03-31 HISTORY — DX: Gastro-esophageal reflux disease without esophagitis: K21.9

## 2021-03-31 HISTORY — DX: Anemia, unspecified: D64.9

## 2021-03-31 HISTORY — DX: Other ascites: R18.8

## 2021-03-31 LAB — CBC WITH DIFFERENTIAL/PLATELET
Abs Immature Granulocytes: 0.02 10*3/uL (ref 0.00–0.07)
Basophils Absolute: 0 10*3/uL (ref 0.0–0.1)
Basophils Relative: 1 %
Eosinophils Absolute: 0.1 10*3/uL (ref 0.0–0.5)
Eosinophils Relative: 2 %
HCT: 26.9 % — ABNORMAL LOW (ref 39.0–52.0)
Hemoglobin: 8.8 g/dL — ABNORMAL LOW (ref 13.0–17.0)
Immature Granulocytes: 0 %
Lymphocytes Relative: 27 %
Lymphs Abs: 1.6 10*3/uL (ref 0.7–4.0)
MCH: 30.7 pg (ref 26.0–34.0)
MCHC: 32.7 g/dL (ref 30.0–36.0)
MCV: 93.7 fL (ref 80.0–100.0)
Monocytes Absolute: 0.6 10*3/uL (ref 0.1–1.0)
Monocytes Relative: 10 %
Neutro Abs: 3.6 10*3/uL (ref 1.7–7.7)
Neutrophils Relative %: 60 %
Platelets: 142 10*3/uL — ABNORMAL LOW (ref 150–400)
RBC: 2.87 MIL/uL — ABNORMAL LOW (ref 4.22–5.81)
RDW: 14.8 % (ref 11.5–15.5)
WBC: 5.9 10*3/uL (ref 4.0–10.5)
nRBC: 0 % (ref 0.0–0.2)

## 2021-03-31 LAB — COMPREHENSIVE METABOLIC PANEL
ALT: 15 U/L (ref 0–44)
AST: 22 U/L (ref 15–41)
Albumin: 2.8 g/dL — ABNORMAL LOW (ref 3.5–5.0)
Alkaline Phosphatase: 76 U/L (ref 38–126)
Anion gap: 6 (ref 5–15)
BUN: 27 mg/dL — ABNORMAL HIGH (ref 8–23)
CO2: 19 mmol/L — ABNORMAL LOW (ref 22–32)
Calcium: 9.1 mg/dL (ref 8.9–10.3)
Chloride: 112 mmol/L — ABNORMAL HIGH (ref 98–111)
Creatinine, Ser: 2.36 mg/dL — ABNORMAL HIGH (ref 0.61–1.24)
GFR, Estimated: 28 mL/min — ABNORMAL LOW (ref >60)
Glucose, Bld: 119 mg/dL — ABNORMAL HIGH (ref 70–99)
Potassium: 4.8 mmol/L (ref 3.5–5.1)
Sodium: 137 mmol/L (ref 135–145)
Total Bilirubin: 2.3 mg/dL — ABNORMAL HIGH (ref 0.3–1.2)
Total Protein: 6.1 g/dL — ABNORMAL LOW (ref 6.5–8.1)

## 2021-03-31 LAB — LIPASE, BLOOD: Lipase: 41 U/L (ref 11–51)

## 2021-03-31 LAB — RENAL FUNCTION PANEL
Albumin: 2.8 g/dL — ABNORMAL LOW (ref 3.5–5.0)
Anion gap: 9 (ref 5–15)
BUN: 29 mg/dL — ABNORMAL HIGH (ref 8–23)
CO2: 17 mmol/L — ABNORMAL LOW (ref 22–32)
Calcium: 9.1 mg/dL (ref 8.9–10.3)
Chloride: 109 mmol/L (ref 98–111)
Creatinine, Ser: 2.34 mg/dL — ABNORMAL HIGH (ref 0.61–1.24)
GFR, Estimated: 28 mL/min — ABNORMAL LOW (ref >60)
Glucose, Bld: 143 mg/dL — ABNORMAL HIGH (ref 70–99)
Phosphorus: 4 mg/dL (ref 2.5–4.6)
Potassium: 5.1 mmol/L (ref 3.5–5.1)
Sodium: 135 mmol/L (ref 135–145)

## 2021-03-31 LAB — TYPE AND SCREEN
ABO/RH(D): O POS
Antibody Screen: NEGATIVE

## 2021-03-31 LAB — HEMOGLOBIN AND HEMATOCRIT, BLOOD
HCT: 27.8 % — ABNORMAL LOW (ref 39.0–52.0)
Hemoglobin: 9 g/dL — ABNORMAL LOW (ref 13.0–17.0)

## 2021-03-31 LAB — GLUCOSE, CAPILLARY
Glucose-Capillary: 111 mg/dL — ABNORMAL HIGH (ref 70–99)
Glucose-Capillary: 125 mg/dL — ABNORMAL HIGH (ref 70–99)
Glucose-Capillary: 123 mg/dL — ABNORMAL HIGH (ref 70–99)
Glucose-Capillary: 151 mg/dL — ABNORMAL HIGH (ref 70–99)

## 2021-03-31 LAB — AMMONIA: Ammonia: 55 umol/L — ABNORMAL HIGH (ref 9–35)

## 2021-03-31 LAB — MAGNESIUM: Magnesium: 1.9 mg/dL (ref 1.7–2.4)

## 2021-03-31 LAB — ABO/RH: ABO/RH(D): O POS

## 2021-03-31 MED ORDER — PANTOPRAZOLE SODIUM 40 MG IV SOLR: 40.0000 mg | Freq: Two times a day (BID) | INTRAVENOUS | Status: DC

## 2021-03-31 MED ORDER — SODIUM CHLORIDE 0.9 % IV SOLN: 510.0000 mg | Freq: Once | INTRAVENOUS | Status: DC

## 2021-03-31 MED ORDER — VITAMIN B-12 1000 MCG PO TABS
1000.0000 ug | ORAL_TABLET | Freq: Every day | ORAL | Status: DC
  Administered 2021-03-31 – 2021-04-02 (×3): 1000 ug via ORAL
  Filled 2021-03-31 (×3): qty 1

## 2021-03-31 MED ORDER — ATORVASTATIN CALCIUM 10 MG PO TABS
10.0000 mg | ORAL_TABLET | Freq: Every day | ORAL | Status: DC
  Administered 2021-03-31 – 2021-04-02 (×3): 10 mg via ORAL
  Filled 2021-03-31 (×3): qty 1

## 2021-03-31 MED ORDER — ONDANSETRON HCL 4 MG PO TABS: 4.0000 mg | ORAL_TABLET | Freq: Four times a day (QID) | ORAL | Status: DC | PRN

## 2021-03-31 MED ORDER — METOPROLOL TARTRATE 25 MG PO TABS
25.0000 mg | ORAL_TABLET | Freq: Two times a day (BID) | ORAL | Status: DC
  Administered 2021-03-31 – 2021-04-01 (×3): 25 mg via ORAL
  Filled 2021-03-31 (×3): qty 1

## 2021-03-31 MED ORDER — SODIUM CHLORIDE 0.9 % IV SOLN
8.0000 mg/h | INTRAVENOUS | Status: DC
  Administered 2021-03-31: 8 mg/h via INTRAVENOUS
  Filled 2021-03-31: qty 80

## 2021-03-31 MED ORDER — SODIUM CHLORIDE 0.9 % IV SOLN
250.0000 mg | Freq: Once | INTRAVENOUS | Status: AC
  Administered 2021-03-31: 250 mg via INTRAVENOUS
  Filled 2021-03-31: qty 20

## 2021-03-31 MED ORDER — PANTOPRAZOLE SODIUM 40 MG PO TBEC
40.0000 mg | DELAYED_RELEASE_TABLET | Freq: Two times a day (BID) | ORAL | Status: DC
  Administered 2021-03-31 – 2021-04-02 (×4): 40 mg via ORAL
  Filled 2021-03-31 (×4): qty 1

## 2021-03-31 MED ORDER — ONDANSETRON HCL 4 MG/2ML IJ SOLN
4.0000 mg | Freq: Four times a day (QID) | INTRAMUSCULAR | Status: DC | PRN
  Administered 2021-04-01: 4 mg via INTRAVENOUS
  Filled 2021-03-31: qty 2

## 2021-03-31 MED ORDER — ISOSORBIDE MONONITRATE ER 30 MG PO TB24
30.0000 mg | ORAL_TABLET | Freq: Every day | ORAL | Status: DC
  Administered 2021-03-31 – 2021-04-01 (×2): 30 mg via ORAL
  Filled 2021-03-31 (×2): qty 1

## 2021-03-31 NOTE — Care Management Obs Status (Signed)
[place] NOTIFICATION   Patient Details  Name: Matthew Ferguson MRN: [id number] Date of Birth: 1946-05-23   Medicare Observation Status Notification Given:  Yes    Bartholomew Crews, RN 03/31/2021, 5:16 PM

## 2021-03-31 NOTE — H&P (View-Only) (Signed)
[facility] Consult: 8:16 AM 03/31/2021  LOS: 1 day    Referring Provider: Dr Cyndia Skeeters Primary Care Physician:  Cher Nakai, MD previously.  Now Dr Teodora Medici (spelling?) in [place].   Primary Gastroenterologist:  Dr.Meisenheimer     Reason for Consultation:  Anemia.  FOBT +   HPI: Matthew Ferguson is a 75 y.o. male.  PMH hepatitis C, patient does not recall treatment for this.  Cirrhosis.  Ascites.  Jaundice while serving in [place] in the late 60s.  CAD with stenting 2013. Bilateral inguinal hernia and umbilical hernia repair in January 2022. Has had 2 or 3 EGDs by Dr. Lyda Jester the most recent was in February 2022 at which time his esophagus was dilated and his dysphagia improved.  Pt does not recall banding procedure or recognize the word varices. 3 previous colonoscopies.  Had polyps removed on the first 2 procedures but none on the third procedure about a year ago. Hx ascites.  Has had 2 paracentesis, the first was in January or February at which point 11 L were removed, the second was in March about 3 or 4 L removed.  Status of SBP unknown.  Does not currently feel like he has ascites. Had been started on diuretics by Dr. Truman Hayward for abdominal and lower extremity swelling.  These have resolved.  However about a month ago one of his meds was discontinued due to hyperkalemia.  Current med lists Lasix 40 mg daily, spironolactone 50 mg daily.  Also takes Protonix 40 mg daily.  08/2020 CTAP showed cirrhosis with changes of portal venous hypertension, portal venous collaterals, large volume ascites.  No worrisome liver lesions.  Biliary tree normal.  S/p cholecystectomy.  Aortic atherosclerosis.  For at least a year patient has had excessive sleepiness but its gotten worse in the last few weeks. Labs obtained on Thursday.   Call from PCP saying to go to the ER because his potassium was high.  He has been at the [facility] ER tonight's Friday and Saturday and transferred to [facility] after reporting dark stools, blood per rectum, 20 pound weight loss over 3 months.  Hb 6.9 addressed with 2 PRBCs.  Potassium 5.7 with EKG changes, received insulin and calcium gluconate.  Na 130.  Platelets 140 Lipase 306, t bili 0.8.  Iron 26.  Ferritin 9.9.  0.8.  Alk phos 145.  AST/ALT 22/14.  INR 1.1.  BUN/Creat 40/2.8. As far as the dark stools go, this has been an intermittent issue for some months.  Has also seen blood per rectum periodically.  No large-volume hematochezia however.  Denies ASA, NSAIDs Patient does not and has never been a consumer of alcohol.  Past Medical History:  Diagnosis Date  . Ascites   . Chronic kidney disease   . Cirrhosis ([place])   . Coronary artery disease involving native coronary artery 05/29/2015   05/08/12 had PCI and Xience stent to RCA for Troponin normal unstable angina with residual 40-50% proximal and 30-40 % mid LAD stenosis, EF normal 55-60%  . Essential hypertension 05/29/2015  . GERD (gastroesophageal  reflux disease)   . Hepatitis-C 03/31/2021  . Irritable bowel syndrome   . Mixed hyperlipidemia 05/29/2015  . Type 2 diabetes mellitus ([place]) 05/29/2015    Past Surgical History:  Procedure Laterality Date  . BACK SURGERY    . CARDIAC CATHETERIZATION    . CHOLECYSTECTOMY    . CORONARY ANGIOPLASTY WITH STENT PLACEMENT    . INGUINAL HERNIA REPAIR Bilateral 01/2021  . KNEE SURGERY      Prior to Admission medications   Medication Sig Start Date End Date Taking? Authorizing Provider  atorvastatin (LIPITOR) 10 MG tablet Take 10 mg by mouth daily.   Yes [provider]  furosemide (LASIX) 40 MG tablet Take 40 mg by mouth daily. 11/05/20  Yes [provider]  isosorbide mononitrate (IMDUR) 30 MG 24 hr tablet Take 1 tablet (30 mg total) by mouth daily. 05/30/20  Yes Richardo Priest, MD  metoprolol tartrate (LOPRESSOR) 25 MG tablet Take 25 mg by mouth 2 (two) times daily.   Yes [provider]  nitroGLYCERIN (NITROSTAT) 0.4 MG SL tablet DISSOLVE 1 TABLET UNDER THE TONGUE EVERY 5 MINUTES FOR UP TO 3 DOSES AS NEEDED FOR CHEST PAIN 01/07/21  Yes Revankar, Reita Cliche, MD  pantoprazole (PROTONIX) 40 MG tablet Take 40 mg by mouth daily. 11/09/20  Yes [provider]  spironolactone (ALDACTONE) 50 MG tablet Take 50 mg by mouth daily. 11/05/20  Yes [provider]    Scheduled Meds: . [START ON 04/03/2021] pantoprazole  40 mg Intravenous Q12H   Infusions: . pantoprozole (PROTONIX) infusion 8 mg/hr (03/31/21 0636)   PRN Meds: ondansetron **OR** ondansetron (ZOFRAN) IV   Allergies as of 03/29/2021 - Review Complete 03/12/2021  Allergen Reaction Noted  . Anesthetics, amide Other (See Comments) 05/29/2015  . Lidocaine Other (See Comments) 05/29/2015    Family History  Problem Relation Age of Onset  . Stroke Father   . Diabetes Father   . Heart disease Father     Social History   Socioeconomic History  . Marital status: Married    Spouse name: Not on file  . Number of children: Not on file  . Years of education: Not on file  . Highest education level: Not on file  Occupational History  . Not on file  Tobacco Use  . Smoking status: Never Smoker  . Smokeless tobacco: Never Used  Vaping Use  . Vaping Use: Never used  Substance and Sexual Activity  . Alcohol use: Never  . Drug use: Never  . Sexual activity: Not on file  Other Topics Concern  . Not on file  Social History Narrative  . Not on file   Social Determinants of Health   Financial Resource Strain: Not on file  Food Insecurity: Not on file  Transportation Needs: Not on file  Physical Activity: Not on file  Stress: Not on file  Social Connections: Not on file  Intimate Partner Violence: Not on file    REVIEW OF SYSTEMS: Constitutional: Some fatigue which is not  profound but he just feels sleepy a lot and sluggish. ENT:  No nose bleeds Pulm: No shortness of breath or cough. CV:  No palpitations, no LE edema.  No angina. GU:  No hematuria, no frequency GI: See HPI. Heme: Denies unusual bleeding or bruising other than the dark stools and occasional blood per rectum. Transfusions:  Received blood transfusion in the late 60s after mortar injury and wartime. Neuro:  No headaches, no peripheral tingling or numbness Derm:  No itching, no rash or sores.  Endocrine:  No sweats or chills.  No polyuria or dysuria Immunization: Not queried.   PHYSICAL EXAM: Vital signs in last 24 hours: Vitals:   03/31/21 0340  BP: 139/75  Pulse: 65  Resp: 17  Temp: (!) 97.4 F (36.3 C)  SpO2: 100%   Wt Readings from Last 3 Encounters:  03/31/21 78.2 kg  12/11/20 76 kg  02/09/20 89.8 kg    General: Patient looks well.  Comfortable, alert and able to provide detailed history. Head: No facial asymmetry or swelling.  No signs of head trauma. Eyes: No scleral icterus.  No conjunctival pallor. Ears: Not hard of hearing Nose: No congestion or discharge Mouth: Oropharynx moist, pink, clear.  Tongue midline. Neck: No JVD, no masses, no thyromegaly. Lungs: CLear bilaterally without labored breathing. Heart: RRR.  No MRG.  S1, S2 present. Abdomen: Nondistended, nontender.  No HSM, masses, bruits, hernias.  Visible scars consistent with bilateral inguinal and umbilical hernia repair.  Active bowel sounds..   Rectal: No visible or palpable masses, fissures, lesions.  Stool is soft, light brown.  Did not Hemoccult. Musc/Skeltl: No joint redness, swelling or gross deformity. Extremities: No CCE. Neurologic: Oriented x3.  Moves all 4 limbs.  No tremor or asterixis. Skin: Very tanned.   Nodes: No cervical adenopathy Psych: Cooperative, calm, pleasant.  Fluid speech.  Intake/Output from previous day: 05/28 0701 - 05/29 0700 In: 0  Out: 825 [Urine:825] Intake/Output  this shift: No intake/output data recorded.  LAB RESULTS: Recent Labs    03/31/21 0659  WBC 5.9  HGB 8.8*  HCT 26.9*  PLT 142*   BMET Lab Results  Component Value Date   NA 137 03/31/2021   NA 138 09/09/2019   K 4.8 03/31/2021   K 4.7 09/09/2019   CL 112 (H) 03/31/2021   CL 104 09/09/2019   CO2 19 (L) 03/31/2021   CO2 20 09/09/2019   GLUCOSE 119 (H) 03/31/2021   GLUCOSE 269 (H) 09/09/2019   BUN 27 (H) 03/31/2021   BUN 12 09/09/2019   CREATININE 2.36 (H) 03/31/2021   CREATININE 1.23 09/09/2019   CALCIUM 9.1 03/31/2021   CALCIUM 9.5 09/09/2019   LFT Recent Labs    03/31/21 0659  PROT 6.1*  ALBUMIN 2.8*  AST 22  ALT 15  ALKPHOS 76  BILITOT 2.3*   PT/INR No results found for: INR, PROTIME Hepatitis Panel No results for input(s): HEPBSAG, HCVAB, HEPAIGM, HEPBIGM in the last 72 hours. C-Diff No components found for: CDIFF Lipase  No results found for: LIPASE  Drugs of Abuse  No results found for: LABOPIA, COCAINSCRNUR, LABBENZ, AMPHETMU, THCU, LABBARB   RADIOLOGY STUDIES: No results found.   IMPRESSION:   *  Intermittent dark stools.  Intermittent minor blood per rectum. By patient history up-to-date on colonoscopy done ~1 year ago by Dr. Lyda Jester. Up-to-date on EGD as well.  The last was around February 2022.  Previous esophageal dilatation but no recall of variceal banding.  *   Anemia.   Hgb 6.9 >> 2 PRBcs >> 8.8 this AM.   Iron and ferritin low consistent with IDA.  *   Hep C, cirrhosis.    *   Hyperkalemia.  Corrected, 4.8 today.  *    elevated lipase at [facility].  LFTs they are normal but T bili here 2.3.  No clinical picture consistent with pancreatitis.  *    AKI.  *   Previous cardiac stent, not on Plavix,  ASA etc.    *   Thrombocytopenia.  Platelets 142.  *    COVID-19 testing at [facility] negative.   *    Hx ascites.  Paracentesis x2 earlier this year.  Clinically no evidence of recurrent ascites on  Lasix/Aldactone.  *   Hyponatremia at [facility] ED, resolved.  *   Normal INR.    PLAN:     *    EGD?  Will discuss with Dr. Loletha Carrow. Can have clears if EGD planned for tmrw.    *    Stop the Protonix drip after current bag finishes infusing and switch to Protonix 40 p.o. twice daily  *    Check the hepatitis C viral load.  Ammonia level.  Liipase level.     Azucena Freed  03/31/2021, 8:16 AM Phone [phone]  I have reviewed the entire case in detail with the above APP and discussed the plan in detail.  Therefore, I agree with the diagnoses recorded above. In addition,  I have personally interviewed and examined the patient. His wife was present at the bedside for the entire visit.  My additional thoughts are as follows:  75 year old man with compensated cirrhosis, reportedly with hepatitis C, but curiously he and his wife stated that diagnosis is not certain.  He recalls having hepatitis when he was in [place], but does not know that he has had any specific viral hepatitis testing, and is certain he has not had any specific treatment for that.  He has never been a regular alcohol user and has no family history of cirrhosis.  He has known portal hypertension with venous collaterals and ascites on last known CT scan from October 2021. He reports intermittent "dark stool" for years and has had longstanding iron deficiency anemia in the setting of CKD.  He has not on regular iron supplement with oral or IV iron.  He does not have a nephrologist or hematologist.  He reports some episodes of self-limited painless bright red blood per rectum, most recently a few weeks ago.  His last colonoscopy was sometime in the last couple of years, and in fact his last upper endoscopy was not anytime this year.  He says that Dr. Lyda Jester "did not want to do it", and had some reluctance but for unclear reasons.  In short, we have limited information about this patient's history and management of his  cirrhosis.  He is on diuretics at low doses, and this has apparently been complicated somewhat by recurrent hyperkalemia, including this admission.  While his hemoglobin is decreased from last known baseline, he does not clear that he has ongoing GI bleeding.  If he does, it is certainly not brisk bleeding and variceal bleeding seems doubtful.  Mildly elevated ammonia, his wife says sometimes he feels unsteady on his feet, he does not get episodes of confusion or slurred speech. He is not overtly encephalopathic at present, but could have low-grade symptoms including his loss of appetite and generalized fatigue.  Somewhat more worrisome is his reported 20 to 30 pound weight loss in the last several months, and both he and his wife say that it has continued well after having had his last therapeutic paracentesis a couple of months ago.  He has early satiety, loss of appetite, denies dysphagia or odynophagia present.   Plans are as follows:  Labs tomorrow morning to include INR, hepatitis AB and C testing (to aid Dr. Lyda Jester with outpatient management of this), AFP.  Complete abdominal  ultrasound to assess for ascites and rule out liver mass in a cirrhotic patient with weight loss.  Upper endoscopy tomorrow to assess for esophageal varices in a cirrhotic patient and to evaluate his upper digestive symptoms and weight loss. He was agreeable after discussion of procedure and risks.  The benefits and risks of the planned procedure were described in detail with the patient or (when appropriate) their health care proxy.  Risks were outlined as including, but not limited to, bleeding, infection, perforation, adverse medication reaction leading to cardiac or pulmonary decompensation, pancreatitis (if ERCP).  The limitation of incomplete mucosal visualization was also discussed.  No guarantees or warranties were given.  Patient at increased risk for cardiopulmonary complications of procedure due to  medical comorbidities.  He can have regular diet today, then n.p.o. after midnight. Protonix drip will be discontinued and instead patient placed on Protonix 40 mg by mouth twice daily.  No octreotide needed at present.  When he is ready for discharge, he will need close follow-up with his primary GI Dr. Lyda Jester in [place] as well as primary care, and strong consideration should be given to outpatient nephrology evaluation to assist with management of his volume status and diuretics, especially given recurrent problems with hyperkalemia.  When following up with GI, low-dose lactulose should be considered as well.  [name] Office:[phone]

## 2021-03-31 NOTE — Plan of Care (Signed)
  Problem: Education: Goal: Knowledge of General Education information will improve Description Including pain rating scale, medication(s)/side effects and non-pharmacologic comfort measures Outcome: Progressing   

## 2021-03-31 NOTE — H&P (Signed)
History and Physical    Matthew Ferguson BZJ:[id number] DOB: [birth year] DOA: 03/31/2021  PCP: Cher Nakai, MD  Patient coming from: [facility].  I have personally briefly reviewed patient's old medical records in [place]  Chief Complaint: Symptomatic anemia and melena.  HPI: Matthew Ferguson is a 75 y.o. male with medical history significant of CAD, hepatitis C, essential hypertension, hyperlipidemia, type 2 diabetes who is referred from [facility] after presenting there with progressively worse dyspnea, fatigue, lightheadedness, hypersomnolence for several days in the setting of melena and BRBPR.  He also described a 20+ pound weight loss in the last 3 months.  Denied abdominal pain, emesis or constipation.  His initial hemoglobin was 6.9 g/dL and he was transfused 2 units of PRBC.  The patient also had a potassium level of 5.7 with some EKG changes treated with insulin and calcium gluconate.  He denies headache, blurred vision, rhinorrhea, sore throat, productive cough, chest pain, palpitations, diaphoresis, orthopnea, but stated he occasionally gets lower extremity edema.  No dysuria, frequency or hematuria.  No polyuria, polydipsia, polyphagia or blurred vision.  Review of Systems: As per HPI otherwise all other systems reviewed and are negative.  Past Medical History:  Diagnosis Date  . Coronary artery disease involving native coronary artery 05/29/2015   05/08/12 had PCI and Xience stent to RCA for Troponin normal unstable angina with residual 40-50% proximal and 30-40 % mid LAD stenosis, EF normal 55-60%  . Essential hypertension 05/29/2015  . Hepatitis-C 03/31/2021  . Mixed hyperlipidemia 05/29/2015  . Type 2 diabetes mellitus ([place]) 05/29/2015    Past Surgical History:  Procedure Laterality Date  . BACK SURGERY    . CARDIAC CATHETERIZATION    . CHOLECYSTECTOMY    . CORONARY ANGIOPLASTY WITH STENT PLACEMENT    . INGUINAL HERNIA REPAIR Bilateral 01/2021  . KNEE SURGERY       Social History  reports that he has never smoked. He has never used smokeless tobacco. He reports that he does not drink alcohol and does not use drugs.  Allergies  Allergen Reactions  . Anesthetics, Amide Other (See Comments)    UNKNOWN  . Lidocaine Other (See Comments)    UNKNOWN    Family History  Problem Relation Age of Onset  . Stroke Father   . Diabetes Father   . Heart disease Father    Prior to Admission medications   Medication Sig Start Date End Date Taking? Authorizing Provider  atorvastatin (LIPITOR) 10 MG tablet Take 10 mg by mouth daily.    [provider]  furosemide (LASIX) 40 MG tablet Take 40 mg by mouth daily. 11/05/20   [provider]  isosorbide mononitrate (IMDUR) 30 MG 24 hr tablet Take 1 tablet (30 mg total) by mouth daily. 05/30/20   Richardo Priest, MD  metoprolol tartrate (LOPRESSOR) 25 MG tablet Take 25 mg by mouth 2 (two) times daily.    [provider]  nitroGLYCERIN (NITROSTAT) 0.4 MG SL tablet DISSOLVE 1 TABLET UNDER THE TONGUE EVERY 5 MINUTES FOR UP TO 3 DOSES AS NEEDED FOR CHEST PAIN 01/07/21   Revankar, Reita Cliche, MD  pantoprazole (PROTONIX) 40 MG tablet Take 40 mg by mouth daily. 11/09/20   [provider]  spironolactone (ALDACTONE) 50 MG tablet Take 50 mg by mouth daily. 11/05/20   [provider]    Physical Exam: Vitals:   03/31/21 0340  BP: 139/75  Pulse: 65  Resp: 17  Temp: (!) 97.4 F (36.3 C)  TempSrc: Oral  SpO2: 100%  Weight: 78.2 kg  Height: 5\' 9"  (1.753 m)    Constitutional: NAD, calm, comfortable Eyes: PERRL, lids and conjunctivae are pale.  Sclera is anicteric. ENMT: Mucous membranes are moist. Posterior pharynx clear of any exudate or lesions. Neck: normal, supple, no masses, no thyromegaly Respiratory: clear to auscultation bilaterally, no wheezing, no crackles. Normal respiratory effort. No accessory muscle use.  Cardiovascular: Regular rate and rhythm, no murmurs / rubs /  gallops. No extremity edema. 2+ pedal pulses. No carotid bruits.  Abdomen: Soft, no tenderness, no masses palpated. No hepatosplenomegaly. Bowel sounds positive.  Musculoskeletal: no clubbing / cyanosis.  Good ROM, no contractures. Normal muscle tone.  Skin: Multiple hyperpigmented macules on upper back (recommended OP dermatology). Neurologic: CN 2-12 grossly intact. Sensation intact, DTR normal. Strength 5/5 in all 4.  Psychiatric: Normal judgment and insight. Alert and oriented x 3. Normal mood.   Labs on Admission: I have personally reviewed following labs and imaging studies  CBC: No results for input(s): WBC, NEUTROABS, HGB, HCT, MCV, PLT in the last 168 hours.  Basic Metabolic Panel: No results for input(s): NA, K, CL, CO2, GLUCOSE, BUN, CREATININE, CALCIUM, MG, PHOS in the last 168 hours.  GFR: CrCl cannot be calculated (Patient's most recent lab result is older than the maximum 21 days allowed.).  Liver Function Tests: No results for input(s): AST, ALT, ALKPHOS, BILITOT, PROT, ALBUMIN in the last 168 hours.  Urine analysis: No results found for: COLORURINE, APPEARANCEUR, LABSPEC, PHURINE, GLUCOSEU, HGBUR, BILIRUBINUR, KETONESUR, PROTEINUR, UROBILINOGEN, NITRITE, LEUKOCYTESUR  Radiological Exams on Admission: No results found.  EKG: Independently reviewed.  Assessment/Plan Principal Problem:   Symptomatic due acute blood loss anemia   Melena  Observation/telemetry. Monitor hematocrit and hemoglobin. Continue pantoprazole infusion. Left message for GI on-call.  Active Problems:   Hepatitis-C Per patient, he follows with GI in [facility] Has not received treatment for it.    Coronary artery disease involving native coronary artery On aspirin, atorvastatin and metoprolol.    Essential hypertension Continue metoprolol 25 mg p.o. twice daily.    Mixed hyperlipidemia On atorvastatin 10 mg p.o. daily.    Type 2 diabetes mellitus (HCC) Diet  controlled. Currently NPO.   DVT prophylaxis: SCDs. Code Status:   Full code. Family Communication:   Disposition Plan:   Patient is from:  Home.  Anticipated DC to:  Home.  Anticipated DC date:  04/01/2021.  Anticipated DC barriers: Clinical status and consultants sign off.  Consults called:   Admission status:  Observation/telemetry.   Severity of Illness: High severity due to having acute blood loss anemia in the setting of melena and BRBPR.  The patient has been transferred to this facility for evaluation by gastroenterology which was not available at the originating facility.  Reubin Milan MD [facility]  How to contact the [facility] Attending or Consulting provider [place] or covering provider during after hours [place], for this patient?   1. Check the care team in [facility] and look for a) attending/consulting TR[facility] provider listed and b) the [facility] team listed 2. Log into [email or web address] and use 's universal password to access. If you do not have the password, please contact the hospital operator. 3. Locate the [facility] provider you are looking for under [facility] and page to a number that you can be directly reached. 4. If you still have difficulty reaching the provider, please page the [facility] (Director on Call) for the Hospitalists listed on amion  for assistance.  03/31/2021, 4:38 AM   This document was prepared using Dragon voice recognition software and may contain some unintended transcription errors.

## 2021-03-31 NOTE — Progress Notes (Signed)
New Admission Note:   Arrival Method: [place] from [place] Orientation:  A & O x4 Telemetry: Box 5M22 - 1st Degree HB with PAC Assessment: Completed Skin:  Dry but intact IV:  Rt and Lt Forearm PIV Pain: Denies Tubes:  None Safety Measures: Safety Fall Prevention Plan has been given, discussed and signed Admission: Completed 5 MW Orientation: Patient has been orientated to the room, unit and staff.  Family:  None at bedside - From home with wife, Fraser Din, and Yorkie, [place]  Patient advised of [employer], [employer].  He does not want wallet or ring to go to hospital safe.  He has his wallet (no money), cell phone with charger, glasses, clothes, and ring.  His is hard of hearing but both hearing aids are currently broken and he does not have them with him.  Patient placed on Telemetry Box [phone].  Orders have been reviewed and implemented. Will continue to monitor the patient. Call light has been placed within reach and bed alarm has been activated.   Earleen Reaper RN- BC, [employer] Phone number:[phone]

## 2021-03-31 NOTE — Consult Note (Addendum)
[facility] Consult: 8:16 AM 03/31/2021  LOS: 1 day    Referring Provider: Dr Cyndia Skeeters Primary Care Physician:  Cher Nakai, MD previously.  Now Dr Teodora Medici (spelling?) in [place].   Primary Gastroenterologist:  Dr.Meisenheimer     Reason for Consultation:  Anemia.  FOBT +   HPI: Matthew Ferguson is a 75 y.o. male.  PMH hepatitis C, patient does not recall treatment for this.  Cirrhosis.  Ascites.  Jaundice while serving in [place] in the late 60s.  CAD with stenting 2013. Bilateral inguinal hernia and umbilical hernia repair in January 2022. Has had 2 or 3 EGDs by Dr. Lyda Jester the most recent was in February 2022 at which time his esophagus was dilated and his dysphagia improved.  Pt does not recall banding procedure or recognize the word varices. 3 previous colonoscopies.  Had polyps removed on the first 2 procedures but none on the third procedure about a year ago. Hx ascites.  Has had 2 paracentesis, the first was in January or February at which point 11 L were removed, the second was in March about 3 or 4 L removed.  Status of SBP unknown.  Does not currently feel like he has ascites. Had been started on diuretics by Dr. Truman Hayward for abdominal and lower extremity swelling.  These have resolved.  However about a month ago one of his meds was discontinued due to hyperkalemia.  Current med lists Lasix 40 mg daily, spironolactone 50 mg daily.  Also takes Protonix 40 mg daily.  08/2020 CTAP showed cirrhosis with changes of portal venous hypertension, portal venous collaterals, large volume ascites.  No worrisome liver lesions.  Biliary tree normal.  S/p cholecystectomy.  Aortic atherosclerosis.  For at least a year patient has had excessive sleepiness but its gotten worse in the last few weeks. Labs obtained on Thursday.   Call from PCP saying to go to the ER because his potassium was high.  He has been at the [facility] ER tonight's Friday and Saturday and transferred to [facility] after reporting dark stools, blood per rectum, 20 pound weight loss over 3 months.  Hb 6.9 addressed with 2 PRBCs.  Potassium 5.7 with EKG changes, received insulin and calcium gluconate.  Na 130.  Platelets 140 Lipase 306, t bili 0.8.  Iron 26.  Ferritin 9.9.  0.8.  Alk phos 145.  AST/ALT 22/14.  INR 1.1.  BUN/Creat 40/2.8. As far as the dark stools go, this has been an intermittent issue for some months.  Has also seen blood per rectum periodically.  No large-volume hematochezia however.  Denies ASA, NSAIDs Patient does not and has never been a consumer of alcohol.  Past Medical History:  Diagnosis Date  . Ascites   . Chronic kidney disease   . Cirrhosis ([place])   . Coronary artery disease involving native coronary artery 05/29/2015   05/08/12 had PCI and Xience stent to RCA for Troponin normal unstable angina with residual 40-50% proximal and 30-40 % mid LAD stenosis, EF normal 55-60%  . Essential hypertension 05/29/2015  . GERD (gastroesophageal  reflux disease)   . Hepatitis-C 03/31/2021  . Irritable bowel syndrome   . Mixed hyperlipidemia 05/29/2015  . Type 2 diabetes mellitus ([place]) 05/29/2015    Past Surgical History:  Procedure Laterality Date  . BACK SURGERY    . CARDIAC CATHETERIZATION    . CHOLECYSTECTOMY    . CORONARY ANGIOPLASTY WITH STENT PLACEMENT    . INGUINAL HERNIA REPAIR Bilateral 01/2021  . KNEE SURGERY      Prior to Admission medications   Medication Sig Start Date End Date Taking? Authorizing Provider  atorvastatin (LIPITOR) 10 MG tablet Take 10 mg by mouth daily.   Yes [provider]  furosemide (LASIX) 40 MG tablet Take 40 mg by mouth daily. 11/05/20  Yes [provider]  isosorbide mononitrate (IMDUR) 30 MG 24 hr tablet Take 1 tablet (30 mg total) by mouth daily. 05/30/20  Yes Richardo Priest, MD  metoprolol tartrate (LOPRESSOR) 25 MG tablet Take 25 mg by mouth 2 (two) times daily.   Yes [provider]  nitroGLYCERIN (NITROSTAT) 0.4 MG SL tablet DISSOLVE 1 TABLET UNDER THE TONGUE EVERY 5 MINUTES FOR UP TO 3 DOSES AS NEEDED FOR CHEST PAIN 01/07/21  Yes Revankar, Reita Cliche, MD  pantoprazole (PROTONIX) 40 MG tablet Take 40 mg by mouth daily. 11/09/20  Yes [provider]  spironolactone (ALDACTONE) 50 MG tablet Take 50 mg by mouth daily. 11/05/20  Yes [provider]    Scheduled Meds: . [START ON 04/03/2021] pantoprazole  40 mg Intravenous Q12H   Infusions: . pantoprozole (PROTONIX) infusion 8 mg/hr (03/31/21 0636)   PRN Meds: ondansetron **OR** ondansetron (ZOFRAN) IV   Allergies as of 03/29/2021 - Review Complete 03/12/2021  Allergen Reaction Noted  . Anesthetics, amide Other (See Comments) 05/29/2015  . Lidocaine Other (See Comments) 05/29/2015    Family History  Problem Relation Age of Onset  . Stroke Father   . Diabetes Father   . Heart disease Father     Social History   Socioeconomic History  . Marital status: Married    Spouse name: Not on file  . Number of children: Not on file  . Years of education: Not on file  . Highest education level: Not on file  Occupational History  . Not on file  Tobacco Use  . Smoking status: Never Smoker  . Smokeless tobacco: Never Used  Vaping Use  . Vaping Use: Never used  Substance and Sexual Activity  . Alcohol use: Never  . Drug use: Never  . Sexual activity: Not on file  Other Topics Concern  . Not on file  Social History Narrative  . Not on file   Social Determinants of Health   Financial Resource Strain: Not on file  Food Insecurity: Not on file  Transportation Needs: Not on file  Physical Activity: Not on file  Stress: Not on file  Social Connections: Not on file  Intimate Partner Violence: Not on file    REVIEW OF SYSTEMS: Constitutional: Some fatigue which is not  profound but he just feels sleepy a lot and sluggish. ENT:  No nose bleeds Pulm: No shortness of breath or cough. CV:  No palpitations, no LE edema.  No angina. GU:  No hematuria, no frequency GI: See HPI. Heme: Denies unusual bleeding or bruising other than the dark stools and occasional blood per rectum. Transfusions:  Received blood transfusion in the late 60s after mortar injury and wartime. Neuro:  No headaches, no peripheral tingling or numbness Derm:  No itching, no rash or sores.  Endocrine:  No sweats or chills.  No polyuria or dysuria Immunization: Not queried.   PHYSICAL EXAM: Vital signs in last 24 hours: Vitals:   03/31/21 0340  BP: 139/75  Pulse: 65  Resp: 17  Temp: (!) 97.4 F (36.3 C)  SpO2: 100%   Wt Readings from Last 3 Encounters:  03/31/21 78.2 kg  12/11/20 76 kg  02/09/20 89.8 kg    General: Patient looks well.  Comfortable, alert and able to provide detailed history. Head: No facial asymmetry or swelling.  No signs of head trauma. Eyes: No scleral icterus.  No conjunctival pallor. Ears: Not hard of hearing Nose: No congestion or discharge Mouth: Oropharynx moist, pink, clear.  Tongue midline. Neck: No JVD, no masses, no thyromegaly. Lungs: CLear bilaterally without labored breathing. Heart: RRR.  No MRG.  S1, S2 present. Abdomen: Nondistended, nontender.  No HSM, masses, bruits, hernias.  Visible scars consistent with bilateral inguinal and umbilical hernia repair.  Active bowel sounds..   Rectal: No visible or palpable masses, fissures, lesions.  Stool is soft, light brown.  Did not Hemoccult. Musc/Skeltl: No joint redness, swelling or gross deformity. Extremities: No CCE. Neurologic: Oriented x3.  Moves all 4 limbs.  No tremor or asterixis. Skin: Very tanned.   Nodes: No cervical adenopathy Psych: Cooperative, calm, pleasant.  Fluid speech.  Intake/Output from previous day: 05/28 0701 - 05/29 0700 In: 0  Out: 825 [Urine:825] Intake/Output  this shift: No intake/output data recorded.  LAB RESULTS: Recent Labs    03/31/21 0659  WBC 5.9  HGB 8.8*  HCT 26.9*  PLT 142*   BMET Lab Results  Component Value Date   NA 137 03/31/2021   NA 138 09/09/2019   K 4.8 03/31/2021   K 4.7 09/09/2019   CL 112 (H) 03/31/2021   CL 104 09/09/2019   CO2 19 (L) 03/31/2021   CO2 20 09/09/2019   GLUCOSE 119 (H) 03/31/2021   GLUCOSE 269 (H) 09/09/2019   BUN 27 (H) 03/31/2021   BUN 12 09/09/2019   CREATININE 2.36 (H) 03/31/2021   CREATININE 1.23 09/09/2019   CALCIUM 9.1 03/31/2021   CALCIUM 9.5 09/09/2019   LFT Recent Labs    03/31/21 0659  PROT 6.1*  ALBUMIN 2.8*  AST 22  ALT 15  ALKPHOS 76  BILITOT 2.3*   PT/INR No results found for: INR, PROTIME Hepatitis Panel No results for input(s): HEPBSAG, HCVAB, HEPAIGM, HEPBIGM in the last 72 hours. C-Diff No components found for: CDIFF Lipase  No results found for: LIPASE  Drugs of Abuse  No results found for: LABOPIA, COCAINSCRNUR, LABBENZ, AMPHETMU, THCU, LABBARB   RADIOLOGY STUDIES: No results found.   IMPRESSION:   *  Intermittent dark stools.  Intermittent minor blood per rectum. By patient history up-to-date on colonoscopy done ~1 year ago by Dr. Lyda Jester. Up-to-date on EGD as well.  The last was around February 2022.  Previous esophageal dilatation but no recall of variceal banding.  *   Anemia.   Hgb 6.9 >> 2 PRBcs >> 8.8 this AM.   Iron and ferritin low consistent with IDA.  *   Hep C, cirrhosis.    *   Hyperkalemia.  Corrected, 4.8 today.  *    elevated lipase at [facility].  LFTs they are normal but T bili here 2.3.  No clinical picture consistent with pancreatitis.  *    AKI.  *   Previous cardiac stent, not on Plavix,  ASA etc.    *   Thrombocytopenia.  Platelets 142.  *    COVID-19 testing at [facility] negative.   *    Hx ascites.  Paracentesis x2 earlier this year.  Clinically no evidence of recurrent ascites on  Lasix/Aldactone.  *   Hyponatremia at [facility] ED, resolved.  *   Normal INR.    PLAN:     *    EGD?  Will discuss with Dr. Loletha Carrow. Can have clears if EGD planned for tmrw.    *    Stop the Protonix drip after current bag finishes infusing and switch to Protonix 40 p.o. twice daily  *    Check the hepatitis C viral load.  Ammonia level.  Liipase level.     Azucena Freed  03/31/2021, 8:16 AM Phone [phone]  I have reviewed the entire case in detail with the above APP and discussed the plan in detail.  Therefore, I agree with the diagnoses recorded above. In addition,  I have personally interviewed and examined the patient. His wife was present at the bedside for the entire visit.  My additional thoughts are as follows:  75 year old man with compensated cirrhosis, reportedly with hepatitis C, but curiously he and his wife stated that diagnosis is not certain.  He recalls having hepatitis when he was in [place], but does not know that he has had any specific viral hepatitis testing, and is certain he has not had any specific treatment for that.  He has never been a regular alcohol user and has no family history of cirrhosis.  He has known portal hypertension with venous collaterals and ascites on last known CT scan from October 2021. He reports intermittent "dark stool" for years and has had longstanding iron deficiency anemia in the setting of CKD.  He has not on regular iron supplement with oral or IV iron.  He does not have a nephrologist or hematologist.  He reports some episodes of self-limited painless bright red blood per rectum, most recently a few weeks ago.  His last colonoscopy was sometime in the last couple of years, and in fact his last upper endoscopy was not anytime this year.  He says that Dr. Lyda Jester "did not want to do it", and had some reluctance but for unclear reasons.  In short, we have limited information about this patient's history and management of his  cirrhosis.  He is on diuretics at low doses, and this has apparently been complicated somewhat by recurrent hyperkalemia, including this admission.  While his hemoglobin is decreased from last known baseline, he does not clear that he has ongoing GI bleeding.  If he does, it is certainly not brisk bleeding and variceal bleeding seems doubtful.  Mildly elevated ammonia, his wife says sometimes he feels unsteady on his feet, he does not get episodes of confusion or slurred speech. He is not overtly encephalopathic at present, but could have low-grade symptoms including his loss of appetite and generalized fatigue.  Somewhat more worrisome is his reported 20 to 30 pound weight loss in the last several months, and both he and his wife say that it has continued well after having had his last therapeutic paracentesis a couple of months ago.  He has early satiety, loss of appetite, denies dysphagia or odynophagia present.   Plans are as follows:  Labs tomorrow morning to include INR, hepatitis AB and C testing (to aid Dr. Lyda Jester with outpatient management of this), AFP.  Complete abdominal  ultrasound to assess for ascites and rule out liver mass in a cirrhotic patient with weight loss.  Upper endoscopy tomorrow to assess for esophageal varices in a cirrhotic patient and to evaluate his upper digestive symptoms and weight loss. He was agreeable after discussion of procedure and risks.  The benefits and risks of the planned procedure were described in detail with the patient or (when appropriate) their health care proxy.  Risks were outlined as including, but not limited to, bleeding, infection, perforation, adverse medication reaction leading to cardiac or pulmonary decompensation, pancreatitis (if ERCP).  The limitation of incomplete mucosal visualization was also discussed.  No guarantees or warranties were given.  Patient at increased risk for cardiopulmonary complications of procedure due to  medical comorbidities.  He can have regular diet today, then n.p.o. after midnight. Protonix drip will be discontinued and instead patient placed on Protonix 40 mg by mouth twice daily.  No octreotide needed at present.  When he is ready for discharge, he will need close follow-up with his primary GI Dr. Lyda Jester in [place] as well as primary care, and strong consideration should be given to outpatient nephrology evaluation to assist with management of his volume status and diuretics, especially given recurrent problems with hyperkalemia.  When following up with GI, low-dose lactulose should be considered as well.  [name] Office:[phone]

## 2021-03-31 NOTE — Progress Notes (Signed)
PROGRESS NOTE  Matthew Ferguson IOX:[id number] DOB: 05/11/1946   PCP: Cher Nakai, MD  Patient is from: Home.  Lives with his wife.  Independently ambulates at baseline.  DOA: 03/31/2021 LOS: 1  Chief complaints: Dyspnea, fatigue, lightheadedness, melena, weight loss  Brief Narrative / Interim history: 75 year old M with PMH of CAD s/p stents in 2013, liver cirrhosis with ascites,  hepatitis C, cholecystectomy, DM-2, HTN and HLD presented to [facility] with progressive dyspnea, fatigue, lightheadedness, hypersomnolence, melena, BRBPR and unintentional 20 pound weight loss in 3 months.  Initial hemoglobin was 6.9.  He was transfused 2 units, and transferred here for GI evaluation.  He also had elevated K25.7 And lipase to 306.  His LFT was within normal. Cr 2.8 with BUN of 40.  Subjective: Seen and examined earlier this morning.  No major events this morning. Denies chest pain, dyspnea, nausea, vomiting or abdominal pain.  He says, "I am just hungry".  He says he is DNR/DNI.  He says his wife will be bringing his living will.   Objective: Vitals:   03/31/21 0340 03/31/21 0826  BP: 139/75 (!) 148/71  Pulse: 65 71  Resp: 17 18  Temp: (!) 97.4 F (36.3 C) 97.6 F (36.4 C)  TempSrc: Oral Oral  SpO2: 100% 99%  Weight: 78.2 kg   Height: 5\' 9"  (1.753 m)     Intake/Output Summary (Last 24 hours) at 03/31/2021 1136 Last data filed at 03/31/2021 0900 Gross per 24 hour  Intake 120 ml  Output 825 ml  Net -705 ml   Filed Weights   03/31/21 0340  Weight: 78.2 kg    Examination:  GENERAL: No apparent distress.  Nontoxic. HEENT: MMM.  Vision and hearing grossly intact.  NECK: Supple.  No apparent JVD.  RESP:  No IWOB.  Fair aeration bilaterally. CVS:  RRR. Heart sounds normal.  ABD/GI/GU: BS+. Abd soft, NTND.  MSK/EXT:  Moves extremities. No apparent deformity. No edema.  SKIN: no apparent skin lesion or wound NEURO: Awake, alert and oriented appropriately.  No apparent focal  neuro deficit. PSYCH: Calm. Normal affect.   Procedures:  None  Microbiology summarized: COVID-19 PCR negative at [place]: Symptomatic acute on chronic blood loss anemia-reports intermittent melena and BRBPR but no large-volume bleeding.   Has history of liver cirrhosis with ascites likely due to hepatitis C.  Had paracentesis a couple of times earlier this year.  Denies NSAID use.  He is not on blood thinner. Hgb 6.9 on presentation to [facility].  Transfused 2 units with appropriate response. Recent Labs    03/31/21 0659  HGB 8.8*  -GI following -Continue IV Protonix -N.p.o. with sips and meds  CAD s/p stents in 2013: dyspnea and fatigue likely from anemia versus ACS.  No chest pain. -Resume home metoprolol, Imdur and atorvastatin   AKI/azotemia? Cr/BUN 2.8 and 40 at [facility].  Unknown baseline.  Improving. Recent Labs    03/31/21 0659  BUN 27*  CREATININE 2.36*  -Recheck labs in the afternoon -Avoid nephrotoxic meds  Hyperkalemia: K was 5.7 at [place].  Resolved.  Non-anion gap metabolic acidosis-likely due to renal failure. -Continue monitoring  Diet controlled NIDDM-2: Not on meds. -Check hemoglobin A1c  Essential hypertension: BP slightly elevated. -Continue home metoprolol and Imdur  Hyperlipidemia -Continue home statin  Thrombocytopenia-could be due to cirrhosis. -Monitor   Goal of care counseling-patient reports DNR/DNI.  He says his wife will be bringing his living will -CODE STATUS changed to DNR/DNI  Body mass index is 25.46 kg/m.         DVT prophylaxis:  SCDs Start: 03/31/21 0347  Code Status: DNR/DNI Family Communication: Patient and/or RN. Available if any question.  Level of care: Telemetry Medical Status is: Observation  The patient will require care spanning > 2 midnights and should be moved to inpatient because: Ongoing diagnostic testing needed not appropriate for outpatient work  up, IV treatments appropriate due to intensity of illness or inability to take PO, Inpatient level of care appropriate due to severity of illness and close monitoring of hemoglobin  Dispo: The patient is from: Home              Anticipated d/c is to: Home              Patient currently is not medically stable to d/c.   Difficult to place patient No       Consultants:  Gastroenterology   Sch Meds:  Scheduled Meds: . pantoprazole  40 mg Oral BID   Continuous Infusions: PRN Meds:.ondansetron **OR** ondansetron (ZOFRAN) IV  Antimicrobials: Anti-infectives (From admission, onward)   None       I have personally reviewed the following labs and images: CBC: Recent Labs  Lab 03/31/21 0659  WBC 5.9  NEUTROABS 3.6  HGB 8.8*  HCT 26.9*  MCV 93.7  PLT 142*   BMP &GFR Recent Labs  Lab 03/31/21 0659  NA 137  K 4.8  CL 112*  CO2 19*  GLUCOSE 119*  BUN 27*  CREATININE 2.36*  CALCIUM 9.1   Estimated Creatinine Clearance: 26.6 mL/min (A) (by C-G formula based on SCr of 2.36 mg/dL (H)). Liver & Pancreas: Recent Labs  Lab 03/31/21 0659  AST 22  ALT 15  ALKPHOS 76  BILITOT 2.3*  PROT 6.1*  ALBUMIN 2.8*   Recent Labs  Lab 03/31/21 0946  LIPASE 41   Recent Labs  Lab 03/31/21 0946  AMMONIA 55*   Diabetic: No results for input(s): HGBA1C in the last 72 hours. Recent Labs  Lab 03/31/21 0342 03/31/21 0651  GLUCAP 111* 125*   Cardiac Enzymes: No results for input(s): CKTOTAL, CKMB, CKMBINDEX, TROPONINI in the last 168 hours. No results for input(s): PROBNP in the last 8760 hours. Coagulation Profile: No results for input(s): INR, PROTIME in the last 168 hours. Thyroid Function Tests: No results for input(s): TSH, T4TOTAL, FREET4, T3FREE, THYROIDAB in the last 72 hours. Lipid Profile: No results for input(s): CHOL, HDL, LDLCALC, TRIG, CHOLHDL, LDLDIRECT in the last 72 hours. Anemia Panel: No results for input(s): VITAMINB12, FOLATE, FERRITIN, TIBC,  IRON, RETICCTPCT in the last 72 hours. Urine analysis: No results found for: COLORURINE, APPEARANCEUR, LABSPEC, PHURINE, GLUCOSEU, HGBUR, BILIRUBINUR, KETONESUR, PROTEINUR, UROBILINOGEN, NITRITE, LEUKOCYTESUR Sepsis Labs: Invalid input(s): PROCALCITONIN, [place]  Microbiology: No results found for this or any previous visit (from the past 240 hour(s)).  Radiology Studies: No results found.     Karon Cotterill T. [place]  If 7PM-7AM, please contact night-coverage [email or web address] 03/31/2021, 11:36 AM

## 2021-04-01 ENCOUNTER — Observation Stay (HOSPITAL_COMMUNITY): Payer: Medicare Other | Admitting: Anesthesiology

## 2021-04-01 ENCOUNTER — Encounter (HOSPITAL_COMMUNITY)
Admission: AD | Disposition: A | Discharge status: Home / Self Care | Payer: Self-pay | Source: Other Acute Inpatient Hospital | Attending: Student

## 2021-04-01 ENCOUNTER — Encounter (HOSPITAL_COMMUNITY): Payer: Self-pay

## 2021-04-01 DIAGNOSIS — I700 Atherosclerosis of aorta: Secondary | ICD-10-CM | POA: Diagnosis not present

## 2021-04-01 DIAGNOSIS — I129 Hypertensive chronic kidney disease with stage 1 through stage 4 chronic kidney disease, or unspecified chronic kidney disease: Secondary | ICD-10-CM | POA: Diagnosis not present

## 2021-04-01 DIAGNOSIS — D649 Anemia, unspecified: Secondary | ICD-10-CM | POA: Diagnosis not present

## 2021-04-01 DIAGNOSIS — I10 Essential (primary) hypertension: Secondary | ICD-10-CM | POA: Diagnosis not present

## 2021-04-01 DIAGNOSIS — Z955 Presence of coronary angioplasty implant and graft: Secondary | ICD-10-CM | POA: Diagnosis not present

## 2021-04-01 DIAGNOSIS — R188 Other ascites: Secondary | ICD-10-CM | POA: Diagnosis present

## 2021-04-01 DIAGNOSIS — K625 Hemorrhage of anus and rectum: Secondary | ICD-10-CM | POA: Diagnosis not present

## 2021-04-01 DIAGNOSIS — K766 Portal hypertension: Secondary | ICD-10-CM | POA: Diagnosis not present

## 2021-04-01 DIAGNOSIS — D62 Acute posthemorrhagic anemia: Secondary | ICD-10-CM | POA: Diagnosis present

## 2021-04-01 DIAGNOSIS — Z9049 Acquired absence of other specified parts of digestive tract: Secondary | ICD-10-CM | POA: Diagnosis not present

## 2021-04-01 DIAGNOSIS — K648 Other hemorrhoids: Secondary | ICD-10-CM | POA: Diagnosis present

## 2021-04-01 DIAGNOSIS — I25118 Atherosclerotic heart disease of native coronary artery with other forms of angina pectoris: Secondary | ICD-10-CM | POA: Diagnosis not present

## 2021-04-01 DIAGNOSIS — K649 Unspecified hemorrhoids: Secondary | ICD-10-CM | POA: Diagnosis not present

## 2021-04-01 DIAGNOSIS — D509 Iron deficiency anemia, unspecified: Secondary | ICD-10-CM | POA: Diagnosis not present

## 2021-04-01 DIAGNOSIS — K7469 Other cirrhosis of liver: Secondary | ICD-10-CM | POA: Diagnosis not present

## 2021-04-01 DIAGNOSIS — Z66 Do not resuscitate: Secondary | ICD-10-CM | POA: Diagnosis not present

## 2021-04-01 DIAGNOSIS — E871 Hypo-osmolality and hyponatremia: Secondary | ICD-10-CM | POA: Diagnosis not present

## 2021-04-01 DIAGNOSIS — K5730 Diverticulosis of large intestine without perforation or abscess without bleeding: Secondary | ICD-10-CM | POA: Diagnosis present

## 2021-04-01 DIAGNOSIS — I2510 Atherosclerotic heart disease of native coronary artery without angina pectoris: Secondary | ICD-10-CM | POA: Diagnosis present

## 2021-04-01 DIAGNOSIS — I8510 Secondary esophageal varices without bleeding: Secondary | ICD-10-CM | POA: Diagnosis not present

## 2021-04-01 DIAGNOSIS — D6959 Other secondary thrombocytopenia: Secondary | ICD-10-CM | POA: Diagnosis not present

## 2021-04-01 DIAGNOSIS — B1920 Unspecified viral hepatitis C without hepatic coma: Secondary | ICD-10-CM | POA: Diagnosis present

## 2021-04-01 DIAGNOSIS — K7460 Unspecified cirrhosis of liver: Secondary | ICD-10-CM | POA: Diagnosis present

## 2021-04-01 DIAGNOSIS — K921 Melena: Secondary | ICD-10-CM | POA: Diagnosis present

## 2021-04-01 DIAGNOSIS — E875 Hyperkalemia: Secondary | ICD-10-CM | POA: Diagnosis present

## 2021-04-01 DIAGNOSIS — N189 Chronic kidney disease, unspecified: Secondary | ICD-10-CM | POA: Diagnosis not present

## 2021-04-01 DIAGNOSIS — K589 Irritable bowel syndrome without diarrhea: Secondary | ICD-10-CM | POA: Diagnosis present

## 2021-04-01 DIAGNOSIS — R634 Abnormal weight loss: Secondary | ICD-10-CM | POA: Diagnosis not present

## 2021-04-01 DIAGNOSIS — E872 Acidosis: Secondary | ICD-10-CM | POA: Diagnosis not present

## 2021-04-01 DIAGNOSIS — N1832 Chronic kidney disease, stage 3b: Secondary | ICD-10-CM | POA: Diagnosis not present

## 2021-04-01 DIAGNOSIS — K3189 Other diseases of stomach and duodenum: Secondary | ICD-10-CM | POA: Diagnosis present

## 2021-04-01 DIAGNOSIS — E782 Mixed hyperlipidemia: Secondary | ICD-10-CM | POA: Diagnosis not present

## 2021-04-01 DIAGNOSIS — E119 Type 2 diabetes mellitus without complications: Secondary | ICD-10-CM | POA: Diagnosis not present

## 2021-04-01 DIAGNOSIS — I8511 Secondary esophageal varices with bleeding: Secondary | ICD-10-CM | POA: Diagnosis not present

## 2021-04-01 DIAGNOSIS — E1122 Type 2 diabetes mellitus with diabetic chronic kidney disease: Secondary | ICD-10-CM | POA: Diagnosis not present

## 2021-04-01 DIAGNOSIS — N179 Acute kidney failure, unspecified: Secondary | ICD-10-CM | POA: Diagnosis not present

## 2021-04-01 HISTORY — PX: ESOPHAGOGASTRODUODENOSCOPY (EGD) WITH PROPOFOL: SHX5813

## 2021-04-01 HISTORY — DX: Acute posthemorrhagic anemia: D62

## 2021-04-01 LAB — RENAL FUNCTION PANEL
Albumin: 2.6 g/dL — ABNORMAL LOW (ref 3.5–5.0)
Anion gap: 8 (ref 5–15)
BUN: 30 mg/dL — ABNORMAL HIGH (ref 8–23)
CO2: 19 mmol/L — ABNORMAL LOW (ref 22–32)
Calcium: 9 mg/dL (ref 8.9–10.3)
Chloride: 107 mmol/L (ref 98–111)
Creatinine, Ser: 2.3 mg/dL — ABNORMAL HIGH (ref 0.61–1.24)
GFR, Estimated: 29 mL/min — ABNORMAL LOW (ref >60)
Glucose, Bld: 104 mg/dL — ABNORMAL HIGH (ref 70–99)
Phosphorus: 4 mg/dL (ref 2.5–4.6)
Potassium: 4.8 mmol/L (ref 3.5–5.1)
Sodium: 134 mmol/L — ABNORMAL LOW (ref 135–145)

## 2021-04-01 LAB — HEPATITIS B SURFACE ANTIBODY,QUALITATIVE: Hep B S Ab: NONREACTIVE

## 2021-04-01 LAB — HEPATITIS B SURFACE ANTIGEN: Hepatitis B Surface Ag: NONREACTIVE

## 2021-04-01 LAB — GLUCOSE, CAPILLARY
Glucose-Capillary: 105 mg/dL — ABNORMAL HIGH (ref 70–99)
Glucose-Capillary: 114 mg/dL — ABNORMAL HIGH (ref 70–99)
Glucose-Capillary: 116 mg/dL — ABNORMAL HIGH (ref 70–99)
Glucose-Capillary: 132 mg/dL — ABNORMAL HIGH (ref 70–99)
Glucose-Capillary: 176 mg/dL — ABNORMAL HIGH (ref 70–99)

## 2021-04-01 LAB — PROTIME-INR
INR: 1.4 — ABNORMAL HIGH (ref 0.8–1.2)
Prothrombin Time: 17.6 seconds — ABNORMAL HIGH (ref 11.4–15.2)

## 2021-04-01 LAB — CBC
HCT: 24.3 % — ABNORMAL LOW (ref 39.0–52.0)
Hemoglobin: 7.7 g/dL — ABNORMAL LOW (ref 13.0–17.0)
MCH: 30.2 pg (ref 26.0–34.0)
MCHC: 31.7 g/dL (ref 30.0–36.0)
MCV: 95.3 fL (ref 80.0–100.0)
Platelets: 141 10*3/uL — ABNORMAL LOW (ref 150–400)
RBC: 2.55 MIL/uL — ABNORMAL LOW (ref 4.22–5.81)
RDW: 14.7 % (ref 11.5–15.5)
WBC: 5.2 10*3/uL (ref 4.0–10.5)
nRBC: 0 % (ref 0.0–0.2)

## 2021-04-01 LAB — HEMOGLOBIN AND HEMATOCRIT, BLOOD
HCT: 25.7 % — ABNORMAL LOW (ref 39.0–52.0)
Hemoglobin: 8.3 g/dL — ABNORMAL LOW (ref 13.0–17.0)

## 2021-04-01 LAB — AFP TUMOR MARKER: AFP, Serum, Tumor Marker: 1.6 ng/mL (ref 0.0–8.4)

## 2021-04-01 LAB — MAGNESIUM: Magnesium: 1.8 mg/dL (ref 1.7–2.4)

## 2021-04-01 LAB — HEPATITIS A ANTIBODY, TOTAL: hep A Total Ab: REACTIVE — AB

## 2021-04-01 LAB — HCV RNA QUANT: HCV Quantitative: NOT DETECTED IU/mL (ref >50)

## 2021-04-01 SURGERY — ESOPHAGOGASTRODUODENOSCOPY (EGD) WITH PROPOFOL
Anesthesia: Monitor Anesthesia Care

## 2021-04-01 MED ORDER — NADOLOL 40 MG PO TABS
40.0000 mg | ORAL_TABLET | Freq: Every day | ORAL | Status: DC
  Filled 2021-04-01: qty 1

## 2021-04-01 MED ORDER — SODIUM CHLORIDE 0.9 % IV SOLN: INTRAVENOUS | Status: DC | PRN

## 2021-04-01 MED ORDER — PROPOFOL 500 MG/50ML IV EMUL
INTRAVENOUS | Status: DC | PRN
  Administered 2021-04-01: 150 ug/kg/min via INTRAVENOUS

## 2021-04-01 MED ORDER — PEG-KCL-NACL-NASULF-NA ASC-C 100 G PO SOLR
0.5000 | Freq: Once | ORAL | Status: AC
  Administered 2021-04-01: 100 g via ORAL

## 2021-04-01 MED ORDER — PROPOFOL 10 MG/ML IV BOLUS
INTRAVENOUS | Status: DC | PRN
  Administered 2021-04-01: 50 mg via INTRAVENOUS

## 2021-04-01 MED ORDER — PEG-KCL-NACL-NASULF-NA ASC-C 100 G PO SOLR
0.5000 | Freq: Once | ORAL | Status: AC
  Administered 2021-04-01: 100 g via ORAL
  Filled 2021-04-01 (×2): qty 1

## 2021-04-01 SURGICAL SUPPLY — 15 items

## 2021-04-01 NOTE — Anesthesia Postprocedure Evaluation (Signed)
Anesthesia Post Note  Patient: Matthew Ferguson  Procedure(s) Performed: ESOPHAGOGASTRODUODENOSCOPY (EGD) WITH PROPOFOL (N/A )     Patient location during evaluation: PACU Anesthesia Type: MAC Level of consciousness: awake and alert Pain management: pain level controlled Vital Signs Assessment: post-procedure vital signs reviewed and stable Respiratory status: spontaneous breathing, nonlabored ventilation and respiratory function stable Cardiovascular status: stable and blood pressure returned to baseline Postop Assessment: no apparent nausea or vomiting Anesthetic complications: no   No complications documented.  Last Vitals:  Vitals:   04/01/21 0834 04/01/21 0926  BP: (!) 117/57 120/61  Pulse: (!) 58 (!) 57  Resp: 12 16  Temp:  (!) 36.4 C  SpO2: 97% 97%    Last Pain:  Vitals:   04/01/21 0926  TempSrc: Oral  PainSc:                  Catalina Gravel

## 2021-04-01 NOTE — Interval H&P Note (Signed)
History and Physical Interval Note:  04/01/2021 7:44 AM  Matthew Ferguson  has presented today for surgery, with the diagnosis of cirrhosis   weight loss    fobt +   anemia.  The various methods of treatment have been discussed with the patient and family. After consideration of risks, benefits and other options for treatment, the patient has consented to  Procedure(s): ESOPHAGOGASTRODUODENOSCOPY (EGD) WITH PROPOFOL (N/A) as a surgical intervention.  The patient's history has been reviewed, patient examined, no change in status, stable for surgery.  I have reviewed the patient's chart and labs.  Questions were answered to the patient's satisfaction.    Hemoglobin 7.7, INR 1.4  [name]

## 2021-04-01 NOTE — Progress Notes (Signed)
PROGRESS NOTE  Matthew Ferguson EBX:[id number] DOB: 1945/12/29   PCP: Cher Nakai, MD  Patient is from: Home.  Lives with his wife.  Independently ambulates at baseline.  DOA: 03/31/2021 LOS: 1  Chief complaints: Dyspnea, fatigue, lightheadedness, melena, weight loss  Brief Narrative / Interim history: 75 year old M with PMH of CAD s/p stents in 2013, liver cirrhosis with ascites,  hepatitis C, cholecystectomy, DM-2, HTN and HLD presented to [facility] with progressive dyspnea, fatigue, lightheadedness, hypersomnolence, melena, BRBPR and unintentional 20 pound weight loss in 3 months.  Initial hemoglobin was 6.9.  He was transfused 2 units, and transferred here for GI evaluation.  He also had elevated K25.7 And lipase to 306.  His LFT was within normal. Cr 2.8 with BUN of 40.  On arrival here, hemodynamically stable.  Hgb 8.8.  Hyperkalemia resolved.  Creatinine improved. EGD on 5/30 with grade II esophageal varices, portal hypertensive gastropathy and normal duodenum.   Plan for colonoscopy on 5/31.  Subjective: Seen and examined earlier this morning.  No major events overnight of this morning.  He was seen after he returned from EGD.  He has not had bowel movements since arrival here.  Denies chest pain, dyspnea, or GI symptoms.   Objective: Vitals:   04/01/21 0824 04/01/21 0831 04/01/21 0834 04/01/21 0926  BP: (!) 118/57 (!) 123/52 (!) 117/57 120/61  Pulse: 63 63 (!) 58 (!) 57  Resp: (!) 24 (!) 21 12 16   Temp:    (!) 97.5 F (36.4 C)  TempSrc:    Oral  SpO2: 98% 97% 97% 97%  Weight:      Height:        Intake/Output Summary (Last 24 hours) at 04/01/2021 1311 Last data filed at 04/01/2021 0818 Gross per 24 hour  Intake 680 ml  Output 400 ml  Net 280 ml   Filed Weights   03/31/21 0340  Weight: 78.2 kg    Examination:  GENERAL: No apparent distress.  Nontoxic. HEENT: MMM.  Vision and hearing grossly intact.  NECK: Supple.  No apparent JVD.  RESP: On RA no IWOB.   Fair aeration bilaterally. CVS:  RRR. Heart sounds normal.  ABD/GI/GU: BS+. Abd soft, NTND.  MSK/EXT:  Moves extremities. No apparent deformity. No edema.  SKIN: no apparent skin lesion or wound NEURO: Awake and alert. Oriented appropriately.  No apparent focal neuro deficit. PSYCH: Calm. Normal affect.   Procedures:  EGD on 5/30 with grade II esophageal varices, portal hypertensive gastropathy and normal duodenum.    Microbiology summarized: COVID-19 PCR negative at [place]: Symptomatic acute on chronic blood loss anemia-reports intermittent melena and BRBPR but no large-volume bleeding. Hgb 6.9 at [facility].   Has history of liver cirrhosis with ascites likely due to hepatitis C.  Had paracentesis a couple of times earlier this year.  Denies NSAID use.  He is not on blood thinner.  Transfused 2 units with appropriate response. Recent Labs    03/31/21 0659 03/31/21 1451 04/01/21 0139  HGB 8.8* 9.0* 7.7*  -EGD as above. -Continue IV Protonix -Clear liquid diet -Plan for colonoscopy on 8/31 -Recheck CBC this afternoon  CAD s/p stents in 2013: dyspnea and fatigue likely from anemia versus ACS.  No chest pain. -Change metoprolol and Imdur to nadolol starting 5/31  AKI/azotemia on CKD-3B? Cr/BUN 2.8 and 40 at [facility].  Unknown baseline.  Improving. Recent Labs    03/31/21 0659 03/31/21 1451 04/01/21 0139  BUN 27* 29* 30*  CREATININE 2.36* 2.34* 2.30*  -Continue monitoring -Avoid nephrotoxic meds  Hyponatremia: Na 134.  Likely due to renal failure. -Recheck in the morning  Hyperkalemia: K was 5.7 at [place].  Resolved.  Non-anion gap metabolic acidosis-likely due to renal failure. -Continue monitoring  Diet controlled NIDDM-2: Not on meds. -Check hemoglobin A1c  Essential hypertension: BP slightly elevated. -Continue home metoprolol and Imdur  Hyperlipidemia -Continue home statin  Thrombocytopenia-could be due to  cirrhosis.  Stable. Recent Labs  Lab 03/31/21 0659 04/01/21 0139  PLT 142* 141*  -Continue monitoring   Goal of care counseling-patient reports DNR/DNI.  He says his wife will be bringing his living will -CODE STATUS changed to DNR/DNI  Body mass index is 25.46 kg/m.         DVT prophylaxis:  SCDs Start: 03/31/21 0347  Code Status: DNR/DNI Family Communication: Patient and/or RN. Available if any question.  Level of care: Telemetry Medical Status is: Observation  The patient will require care spanning > 2 midnights and should be moved to inpatient because: Ongoing diagnostic testing needed not appropriate for outpatient work up, IV treatments appropriate due to intensity of illness or inability to take PO, Inpatient level of care appropriate due to severity of illness and close monitoring of hemoglobin  Dispo: The patient is from: Home              Anticipated d/c is to: Home              Patient currently is not medically stable to d/c.   Difficult to place patient No       Consultants:  Gastroenterology   Sch Meds:  Scheduled Meds: . atorvastatin  10 mg Oral Daily  . isosorbide mononitrate  30 mg Oral Daily  . metoprolol tartrate  25 mg Oral BID  . pantoprazole  40 mg Oral BID  . peg 3350 powder  0.5 kit Oral Once  . peg 3350 powder  0.5 kit Oral Once  . vitamin B-12  1,000 mcg Oral Daily   Continuous Infusions: PRN Meds:.ondansetron **OR** ondansetron (ZOFRAN) IV  Antimicrobials: Anti-infectives (From admission, onward)   None       I have personally reviewed the following labs and images: CBC: Recent Labs  Lab 03/31/21 0659 03/31/21 1451 04/01/21 0139  WBC 5.9  --  5.2  NEUTROABS 3.6  --   --   HGB 8.8* 9.0* 7.7*  HCT 26.9* 27.8* 24.3*  MCV 93.7  --  95.3  PLT 142*  --  141*   BMP &GFR Recent Labs  Lab 03/31/21 0659 03/31/21 1451 04/01/21 0139  NA 137 135 134*  K 4.8 5.1 4.8  CL 112* 109 107  CO2 19* 17* 19*  GLUCOSE 119* 143*  104*  BUN 27* 29* 30*  CREATININE 2.36* 2.34* 2.30*  CALCIUM 9.1 9.1 9.0  MG  --  1.9 1.8  PHOS  --  4.0 4.0   Estimated Creatinine Clearance: 27.3 mL/min (A) (by C-G formula based on SCr of 2.3 mg/dL (H)). Liver & Pancreas: Recent Labs  Lab 03/31/21 0659 03/31/21 1451 04/01/21 0139  AST 22  --   --   ALT 15  --   --   ALKPHOS 76  --   --   BILITOT 2.3*  --   --   PROT 6.1*  --   --   ALBUMIN 2.8* 2.8* 2.6*   Recent Labs  Lab 03/31/21 0946  LIPASE 41   Recent Labs  Lab  03/31/21 0946  AMMONIA 55*   Diabetic: No results for input(s): HGBA1C in the last 72 hours. Recent Labs  Lab 03/31/21 1318 03/31/21 1855 04/01/21 0016 04/01/21 0641 04/01/21 1158  GLUCAP 123* 151* 105* 114* 176*   Cardiac Enzymes: No results for input(s): CKTOTAL, CKMB, CKMBINDEX, TROPONINI in the last 168 hours. No results for input(s): PROBNP in the last 8760 hours. Coagulation Profile: Recent Labs  Lab 04/01/21 0139  INR 1.4*   Thyroid Function Tests: No results for input(s): TSH, T4TOTAL, FREET4, T3FREE, THYROIDAB in the last 72 hours. Lipid Profile: No results for input(s): CHOL, HDL, LDLCALC, TRIG, CHOLHDL, LDLDIRECT in the last 72 hours. Anemia Panel: No results for input(s): VITAMINB12, FOLATE, FERRITIN, TIBC, IRON, RETICCTPCT in the last 72 hours. Urine analysis: No results found for: COLORURINE, APPEARANCEUR, LABSPEC, PHURINE, GLUCOSEU, HGBUR, BILIRUBINUR, KETONESUR, PROTEINUR, UROBILINOGEN, NITRITE, LEUKOCYTESUR Sepsis Labs: Invalid input(s): PROCALCITONIN, [place]  Microbiology: No results found for this or any previous visit (from the past 240 hour(s)).  Radiology Studies: US Abdomen Complete  Result Date: 03/31/2021 CLINICAL DATA:  Cirrhosis EXAM: ABDOMEN ULTRASOUND COMPLETE COMPARISON:  08/27/2020 FINDINGS: Gallbladder: Surgically absent Common bile duct: Diameter: 5 mm Liver: Heterogeneous coarsened liver echotexture and capsular nodularity compatible with known  history of cirrhosis. No focal liver abnormalities are identified. No biliary duct dilation. Portal vein is patent on color Doppler imaging with normal direction of blood flow towards the liver. IVC: No abnormality visualized. Pancreas: Portions of the head and body of the pancreas are unremarkable. The remainder of the pancreas is obscured by bowel gas. Spleen: Borderline splenomegaly measuring up to 12.2 cm in craniocaudal length. No focal abnormalities. Right Kidney: Length: 10.0 cm. Echogenicity within normal limits. No mass or hydronephrosis visualized. Left Kidney: Length: 11.2 cm. Echogenicity within normal limits. No mass or hydronephrosis visualized. Abdominal aorta: No aneurysm visualized. Other findings: There is trace ascites within the bilateral upper quadrants, right greater than left. Volume of ascites is insufficient for paracentesis. IMPRESSION: 1. Hepatic cirrhosis. 2. Borderline splenomegaly. 3. Trace upper abdominal ascites. Electronically Signed   By: Randa Ngo M.D.   On: 03/31/2021 20:23    Jensen Kilburg T. [place]  If 7PM-7AM, please contact night-coverage [email or web address] 04/01/2021, 1:11 PM

## 2021-04-01 NOTE — Transfer of Care (Signed)
Immediate Anesthesia Transfer of Care Note  Patient: Matthew Ferguson  Procedure(s) Performed: ESOPHAGOGASTRODUODENOSCOPY (EGD) WITH PROPOFOL (N/A )  Patient Location: Endoscopy Unit  Anesthesia Type:MAC  Level of Consciousness: drowsy and patient cooperative  Airway & Oxygen Therapy: Patient Spontanous Breathing and Patient connected to nasal cannula oxygen  Post-op Assessment: Report given to RN and Post -op Vital signs reviewed and stable  Post vital signs: Reviewed and stable  Last Vitals:  Vitals Value Taken Time  BP 117/54 04/01/21 0814  Temp    Pulse 76 04/01/21 0817  Resp 15 04/01/21 0817  SpO2 99 % 04/01/21 0817  Vitals shown include unvalidated device data.  Last Pain:  Vitals:   04/01/21 0731  TempSrc: Oral  PainSc: 0-No pain         Complications: No complications documented.

## 2021-04-01 NOTE — Anesthesia Procedure Notes (Signed)
Procedure Name: MAC Date/Time: 04/01/2021 7:53 AM Performed by: Leonor Liv, CRNA Pre-anesthesia Checklist: Patient identified, Emergency Drugs available, Suction available, Patient being monitored and Timeout performed Patient Re-evaluated:Patient Re-evaluated prior to induction Oxygen Delivery Method: Nasal cannula Preoxygenation: Pre-oxygenation with 100% oxygen Airway Equipment and Method: Bite block Placement Confirmation: positive ETCO2

## 2021-04-01 NOTE — Op Note (Signed)
[facility] Patient Name: Matthew Ferguson Procedure Date : 04/01/2021 MRN: [id number] Attending MD: Estill Cotta. Matthew Ferguson , MD Date of Birth: 09-Aug-1946 CSN: [id number] Age: 75 Admit Type: Inpatient Procedure:                Upper GI endoscopy Indications:              Unexplained iron deficiency anemia, Hematochezia,                            Cirrhosis rule out esophageal varices, Anorexia,                            Early satiety, Weight loss Providers:                Mallie Mussel L. Matthew Carrow, MD, Doristine Johns, RN, Laverda Sorenson, Technician, Trixie Deis, CRNA Referring MD:             [facility] Medicines:                Monitored Anesthesia Care Complications:            No immediate complications. Estimated Blood Loss:     Estimated blood loss: none. Procedure:                Pre-Anesthesia Assessment:                           - Prior to the procedure, a History and Physical                            was performed, and patient medications and                            allergies were reviewed. The patient's tolerance of                            previous anesthesia was also reviewed. The risks                            and benefits of the procedure and the sedation                            options and risks were discussed with the patient.                            All questions were answered, and informed consent                            was obtained. Prior Anticoagulants: The patient has                            taken no previous anticoagulant or antiplatelet  agents. ASA Grade Assessment: III - A patient with                            severe systemic disease. After reviewing the risks                            and benefits, the patient was deemed in                            satisfactory condition to undergo the procedure.                           After obtaining informed consent, the endoscope was                             passed under direct vision. Throughout the                            procedure, the patient's blood pressure, pulse, and                            oxygen saturations were monitored continuously. The                            GIF-H190 ([id number] Olympus gastroscope was                            introduced through the mouth, and advanced to the                            second part of duodenum. The upper GI endoscopy was                            accomplished without difficulty. The patient                            tolerated the procedure well. Scope In: Scope Out: Findings:      Grade II varices were found in the lower third of the esophagus. No       active bleeding or stigmata of bleeding.      Mild portal hypertensive gastropathy was found in the entire examined       stomach.      The exam of the stomach was otherwise normal, including on retroflexion.       No gastric varices seen.      The examined duodenum was normal. Impression:               - Grade II esophageal varices.                           - Portal hypertensive gastropathy.                           - Normal examined duodenum.                           -  No specimens collected. Recommendation:           - Return patient to hospital ward for ongoing care.                           - Clear liquid diet.                           - Perform a colonoscopy tomorrow.                           - At or shortly after discharge, non-selective beta                            blocker will be started for primary variceal                            prophylaxis.                           Patient would benefit from outpatient Nephrology                            evaluation to help manage diuretics and CKD. Procedure Code(s):        --- Professional ---                           [phone], Esophagogastroduodenoscopy, flexible,                            transoral; diagnostic, including collection of                             specimen(s) by brushing or washing, when performed                            (separate procedure) Diagnosis Code(s):        --- Professional ---                           K74.60, Unspecified cirrhosis of liver                           I85.10, Secondary esophageal varices without                            bleeding                           K76.6, Portal hypertension                           K31.89, Other diseases of stomach and duodenum                           D50.9, Iron deficiency anemia, unspecified  K92.1, Melena (includes Hematochezia)                           R63.0, Anorexia                           R68.81, Early satiety                           R63.4, Abnormal weight loss CPT copyright 2019 [employer]. All rights reserved. The codes documented in this report are preliminary and upon coder review may  be revised to meet current compliance requirements. [name] L. Matthew Carrow, MD 04/01/2021 8:20:36 AM This report has been signed electronically. Number of Addenda: 0

## 2021-04-01 NOTE — Anesthesia Preprocedure Evaluation (Addendum)
Anesthesia Evaluation  Patient identified by MRN, date of birth, ID band Patient awake    Reviewed: Allergy & Precautions, NPO status , Patient's Chart, lab work & pertinent test results, reviewed documented beta blocker date and time   History of Anesthesia Complications (+) PONV and history of anesthetic complications  Airway Mallampati: II  TM Distance: >3 FB Neck ROM: Full    Dental  (+) Teeth Intact, Dental Advisory Given, Caps, Implants,    Pulmonary neg pulmonary ROS,    Pulmonary exam normal breath sounds clear to auscultation       Cardiovascular hypertension, Pt. on home beta blockers and Pt. on medications + CAD and + Cardiac Stents  Normal cardiovascular exam Rhythm:Regular Rate:Normal     Neuro/Psych negative neurological ROS  negative psych ROS   GI/Hepatic GERD  ,(+) Cirrhosis   ascites    , Hepatitis -, C  Endo/Other  diabetes, Type 2  Renal/GU Renal InsufficiencyRenal disease     Musculoskeletal negative musculoskeletal ROS (+)   Abdominal   Peds  Hematology  (+) Blood dyscrasia (Thrombocytopenia), anemia ,   Anesthesia Other Findings   Reproductive/Obstetrics                            Anesthesia Physical Anesthesia Plan  ASA: III  Anesthesia Plan: MAC   Post-op Pain Management:    Induction: Intravenous  PONV Risk Score and Plan: 1 and Propofol infusion and Treatment may vary due to age or medical condition  Airway Management Planned: Natural Airway and Nasal Cannula  Additional Equipment:   Intra-op Plan:   Post-operative Plan:   Informed Consent: I have reviewed the patients History and Physical, chart, labs and discussed the procedure including the risks, benefits and alternatives for the proposed anesthesia with the patient or authorized representative who has indicated his/her understanding and acceptance.   Patient has DNR.  Discussed DNR with  patient and Continue DNR.   Dental advisory given  Plan Discussed with: CRNA  Anesthesia Plan Comments:        Anesthesia Quick Evaluation

## 2021-04-02 ENCOUNTER — Inpatient Hospital Stay (HOSPITAL_COMMUNITY): Payer: Medicare Other | Admitting: Certified Registered"

## 2021-04-02 ENCOUNTER — Encounter (HOSPITAL_COMMUNITY): Payer: Self-pay | Admitting: Student

## 2021-04-02 ENCOUNTER — Encounter (HOSPITAL_COMMUNITY)
Admission: AD | Disposition: A | Discharge status: Home / Self Care | Payer: Self-pay | Source: Other Acute Inpatient Hospital | Attending: Student

## 2021-04-02 DIAGNOSIS — K766 Portal hypertension: Secondary | ICD-10-CM

## 2021-04-02 DIAGNOSIS — D62 Acute posthemorrhagic anemia: Principal | ICD-10-CM

## 2021-04-02 DIAGNOSIS — K7469 Other cirrhosis of liver: Secondary | ICD-10-CM

## 2021-04-02 DIAGNOSIS — K648 Other hemorrhoids: Secondary | ICD-10-CM

## 2021-04-02 DIAGNOSIS — D649 Anemia, unspecified: Secondary | ICD-10-CM | POA: Diagnosis not present

## 2021-04-02 DIAGNOSIS — I10 Essential (primary) hypertension: Secondary | ICD-10-CM | POA: Diagnosis not present

## 2021-04-02 DIAGNOSIS — N189 Chronic kidney disease, unspecified: Secondary | ICD-10-CM

## 2021-04-02 DIAGNOSIS — R634 Abnormal weight loss: Secondary | ICD-10-CM

## 2021-04-02 DIAGNOSIS — I25118 Atherosclerotic heart disease of native coronary artery with other forms of angina pectoris: Secondary | ICD-10-CM

## 2021-04-02 DIAGNOSIS — K5730 Diverticulosis of large intestine without perforation or abscess without bleeding: Secondary | ICD-10-CM

## 2021-04-02 DIAGNOSIS — E119 Type 2 diabetes mellitus without complications: Secondary | ICD-10-CM

## 2021-04-02 DIAGNOSIS — N179 Acute kidney failure, unspecified: Secondary | ICD-10-CM

## 2021-04-02 DIAGNOSIS — I8510 Secondary esophageal varices without bleeding: Secondary | ICD-10-CM

## 2021-04-02 DIAGNOSIS — E782 Mixed hyperlipidemia: Secondary | ICD-10-CM

## 2021-04-02 DIAGNOSIS — K625 Hemorrhage of anus and rectum: Secondary | ICD-10-CM

## 2021-04-02 DIAGNOSIS — K3189 Other diseases of stomach and duodenum: Secondary | ICD-10-CM

## 2021-04-02 HISTORY — PX: COLONOSCOPY WITH PROPOFOL: SHX5780

## 2021-04-02 LAB — CBC
HCT: 25.1 % — ABNORMAL LOW (ref 39.0–52.0)
Hemoglobin: 8 g/dL — ABNORMAL LOW (ref 13.0–17.0)
MCH: 30.3 pg (ref 26.0–34.0)
MCHC: 31.9 g/dL (ref 30.0–36.0)
MCV: 95.1 fL (ref 80.0–100.0)
Platelets: 144 10*3/uL — ABNORMAL LOW (ref 150–400)
RBC: 2.64 MIL/uL — ABNORMAL LOW (ref 4.22–5.81)
RDW: 14.6 % (ref 11.5–15.5)
WBC: 5.3 10*3/uL (ref 4.0–10.5)
nRBC: 0 % (ref 0.0–0.2)

## 2021-04-02 LAB — HEMOGLOBIN A1C
Hgb A1c MFr Bld: 6.7 % — ABNORMAL HIGH (ref 4.8–5.6)
Mean Plasma Glucose: 146 mg/dL

## 2021-04-02 LAB — RENAL FUNCTION PANEL
Albumin: 3 g/dL — ABNORMAL LOW (ref 3.5–5.0)
Anion gap: 9 (ref 5–15)
BUN: 24 mg/dL — ABNORMAL HIGH (ref 8–23)
CO2: 19 mmol/L — ABNORMAL LOW (ref 22–32)
Calcium: 9.4 mg/dL (ref 8.9–10.3)
Chloride: 106 mmol/L (ref 98–111)
Creatinine, Ser: 2.06 mg/dL — ABNORMAL HIGH (ref 0.61–1.24)
GFR, Estimated: 33 mL/min — ABNORMAL LOW (ref >60)
Glucose, Bld: 109 mg/dL — ABNORMAL HIGH (ref 70–99)
Phosphorus: 4 mg/dL (ref 2.5–4.6)
Potassium: 4 mmol/L (ref 3.5–5.1)
Sodium: 134 mmol/L — ABNORMAL LOW (ref 135–145)

## 2021-04-02 LAB — GLUCOSE, CAPILLARY
Glucose-Capillary: 104 mg/dL — ABNORMAL HIGH (ref 70–99)
Glucose-Capillary: 117 mg/dL — ABNORMAL HIGH (ref 70–99)

## 2021-04-02 LAB — MAGNESIUM: Magnesium: 1.7 mg/dL (ref 1.7–2.4)

## 2021-04-02 SURGERY — COLONOSCOPY WITH PROPOFOL
Anesthesia: Monitor Anesthesia Care

## 2021-04-02 MED ORDER — NADOLOL 40 MG PO TABS: 40.0000 mg | ORAL_TABLET | Freq: Every day | ORAL | 1 refills | Status: DC

## 2021-04-02 MED ORDER — PANTOPRAZOLE SODIUM 40 MG PO TBEC: 40.0000 mg | DELAYED_RELEASE_TABLET | Freq: Two times a day (BID) | ORAL | 0 refills | Status: DC

## 2021-04-02 MED ORDER — FUROSEMIDE 20 MG PO TABS: 20.0000 mg | ORAL_TABLET | Freq: Every day | ORAL | 1 refills | Status: DC

## 2021-04-02 MED ORDER — PROPOFOL 500 MG/50ML IV EMUL
INTRAVENOUS | Status: DC | PRN
  Administered 2021-04-02: 150 ug/kg/min via INTRAVENOUS

## 2021-04-02 MED ORDER — HYDROCORTISONE ACETATE 25 MG RE SUPP: 25.0000 mg | Freq: Two times a day (BID) | RECTAL | 1 refills | Status: DC | PRN

## 2021-04-02 MED ORDER — SODIUM CHLORIDE 0.9 % IV SOLN: INTRAVENOUS | Status: DC

## 2021-04-02 SURGICAL SUPPLY — 21 items

## 2021-04-02 NOTE — Evaluation (Signed)
Occupational Therapy Evaluation Patient Details Name: Matthew Ferguson MRN: [id number] DOB: June 09, 1946 Today's Date: 04/02/2021    History of Present Illness Pt is a 75 y/o male admitted secondary to symptomatic anemia secondary to melena on 5/29. Pt is s/p EGD on 5/30 and s/p colonoscopy on 5/31. PMH includes CAD, HTN, DM, and hepatitis C.   Clinical Impression   Pt PTA: Pt living in ILF with spouse and reports independence for ADL/iADL and mobility. Pt currently, Pt ambulating in room with no AD and occasional use of furniture to lightly steady self. Pt performing OOB ADL; standing at sink for ADL and able to reach BLEs with figure 4 technique. Pt education on energy conservation technique with handout provided. Pt appears very close to his baseline; spouse in room agrees. Pt does not require continued OT skilled services. OT signing off, thank you.    Follow Up Recommendations  No OT follow up;Supervision - Intermittent (for transfers in/out of walk-in shower)    Equipment Recommendations  None recommended by OT    Recommendations for Other Services       Precautions / Restrictions Precautions Precautions: Fall Restrictions Weight Bearing Restrictions: No      Mobility Bed Mobility Overal bed mobility: Modified Independent                  Transfers Overall transfer level: Needs assistance Equipment used: None Transfers: Sit to/from Stand Sit to Stand: Min guard         General transfer comment: Min guard for safety.    Balance Overall balance assessment: Needs assistance Sitting-balance support: No upper extremity supported;Feet supported Sitting balance-Leahy Scale: Fair     Standing balance support: No upper extremity supported;During functional activity Standing balance-Leahy Scale: Fair Standing balance comment: fair dynamic standing balance                           ADL either performed or assessed with clinical judgement   ADL Overall  ADL's : Modified independent                                       General ADL Comments: Pt ambulating in room with no AD and occasional use of furniture to lightly steady self. Pt performing OOB ADL with no physical assist to transfer to commode with 1 rail to stand and practicing walk in shower transfer with 1 grab bar with supervisionA to modified independence.  Pt standing at sink for ADL and able to reach BLEs with figure 4 technique.     Vision Baseline Vision/History: Wears glasses Wears Glasses: At all times Patient Visual Report: No change from baseline Vision Assessment?: No apparent visual deficits     Perception     Praxis      Pertinent Vitals/Pain Pain Assessment: No/denies pain Faces Pain Scale: Hurts a little bit Pain Location: abdomen Pain Descriptors / Indicators: Aching Pain Intervention(s): Monitored during session     Hand Dominance Right   Extremity/Trunk Assessment Upper Extremity Assessment Upper Extremity Assessment: Overall WFL for tasks assessed   Lower Extremity Assessment Lower Extremity Assessment: Overall WFL for tasks assessed   Cervical / Trunk Assessment Cervical / Trunk Assessment: Kyphotic   Communication Communication Communication: No difficulties   Cognition Arousal/Alertness: Awake/alert Behavior During Therapy: WFL for tasks assessed/performed Overall Cognitive Status: Within Functional Limits for tasks assessed  General Comments  VSS on RA    Exercises     Shoulder Instructions      Home Living Family/patient expects to be discharged to:: Private residence Living Arrangements: Spouse/significant other Available Help at Discharge: Family Type of Home: House Home Access: Level entry     Home Layout: One level     Bathroom Shower/Tub: Occupational psychologist: Handicapped height     Home Equipment: Civil engineer, contracting;Wheelchair - [employer]  - 2 wheels;Cane - single point          Prior Functioning/Environment Level of Independence: Independent                 OT Problem List: Decreased activity tolerance      OT Treatment/Interventions:      OT Goals(Current goals can be found in the care plan section) Acute Rehab OT Goals Patient Stated Goal: to go home OT Goal Formulation: All assessment and education complete, DC therapy Potential to Achieve Goals: Good  OT Frequency:     Barriers to D/C:            Co-evaluation              AM-PAC OT "6 Clicks" Daily Activity     Outcome Measure Help from another person eating meals?: None Help from another person taking care of personal grooming?: None Help from another person toileting, which includes using toliet, bedpan, or urinal?: None Help from another person bathing (including washing, rinsing, drying)?: A Little Help from another person to put on and taking off regular upper body clothing?: None Help from another person to put on and taking off regular lower body clothing?: None 6 Click Score: 23   End of Session Nurse Communication: Mobility status  Activity Tolerance: Patient tolerated treatment well Patient left: in chair;with call bell/phone within reach;with family/visitor present  OT Visit Diagnosis: Unsteadiness on feet (R26.81)                Time: 1941-7408 OT Time Calculation (min): 25 min Charges:  OT General Charges $OT Visit: 1 Visit OT Evaluation $OT Eval Moderate Complexity: 1 Mod OT Treatments $Self Care/Home Management : 8-22 mins  Jefferey Pica, OTR/L Acute Rehabilitation Services Pager: [phone] Office: [phone]   Moria Brophy C 04/02/2021, 12:49 PM

## 2021-04-02 NOTE — Anesthesia Postprocedure Evaluation (Signed)
Anesthesia Post Note  Patient: Matthew Ferguson  Procedure(s) Performed: COLONOSCOPY WITH PROPOFOL (N/A )     Patient location during evaluation: PACU Anesthesia Type: MAC Level of consciousness: awake and alert Pain management: pain level controlled Vital Signs Assessment: post-procedure vital signs reviewed and stable Respiratory status: spontaneous breathing, nonlabored ventilation and respiratory function stable Cardiovascular status: blood pressure returned to baseline and stable Postop Assessment: no apparent nausea or vomiting Anesthetic complications: no   No complications documented.  Last Vitals:  Vitals:   04/02/21 1021 04/02/21 1043  BP: 122/65 129/62  Pulse: 68 (!) 59  Resp: 15 18  Temp:  36.7 C  SpO2: 100% 100%    Last Pain:  Vitals:   04/02/21 1043  TempSrc: Oral  PainSc:    Pain Goal:                   Pervis Hocking

## 2021-04-02 NOTE — Progress Notes (Signed)
DISCHARGE NOTE HOME Matthew Ferguson to be discharged home per MD order. Discussed prescriptions and follow up appointments with the patient. Prescriptions given to patient; medication list explained in detail. Patient verbalized understanding.  Skin clean, dry and intact without evidence of skin break down, no evidence of skin tears noted. IV catheter discontinued intact. Site without signs and symptoms of complications. Dressing and pressure applied. Pt denies pain at the site currently. No complaints noted.  Patient free of lines, drains, and wounds.   An After Visit Summary (AVS) was printed and given to the patient. Patient escorted via wheelchair, and discharged home via private auto.  [name], RN

## 2021-04-02 NOTE — Transfer of Care (Signed)
Immediate Anesthesia Transfer of Care Note  Patient: Matthew Ferguson  Procedure(s) Performed: COLONOSCOPY WITH PROPOFOL (N/A )  Patient Location: Endoscopy Unit  Anesthesia Type:MAC  Level of Consciousness: awake, alert  and oriented  Airway & Oxygen Therapy: Patient Spontanous Breathing and Patient connected to face mask oxygen  Post-op Assessment: Report given to RN and Post -op Vital signs reviewed and stable  Post vital signs: Reviewed and stable  Last Vitals:  Vitals Value Taken Time  BP 135/61 04/02/21 1001  Temp    Pulse 79 04/02/21 1002  Resp 22 04/02/21 1002  SpO2 100 % 04/02/21 1002  Vitals shown include unvalidated device data.  Last Pain:  Vitals:   04/02/21 0817  TempSrc: Temporal  PainSc: 0-No pain         Complications: No complications documented.

## 2021-04-02 NOTE — Interval H&P Note (Signed)
History and Physical Interval Note:  04/02/2021 9:10 AM  Matthew Ferguson  has presented today for surgery, with the diagnosis of hematochezia.  The various methods of treatment have been discussed with the patient and family. After consideration of risks, benefits and other options for treatment, the patient has consented to  Procedure(s): COLONOSCOPY WITH PROPOFOL (N/A) as a surgical intervention.  The patient's history has been reviewed, patient examined, no change in status, stable for surgery.  I have reviewed the patient's chart and labs.  Questions were answered to the patient's satisfaction.     Matthew Ferguson

## 2021-04-02 NOTE — Interval H&P Note (Signed)
History and Physical Interval Note:  04/02/2021 9:12 AM  Matthew Ferguson  has presented today for surgery, with the diagnosis of hematochezia.  The various methods of treatment have been discussed with the patient and family. After consideration of risks, benefits and other options for treatment, the patient has consented to  Procedure(s): COLONOSCOPY WITH PROPOFOL (N/A) as a surgical intervention.  The patient's history has been reviewed, patient examined, no change in status, stable for surgery.  I have reviewed the patient's chart and labs.  Questions were answered to the patient's satisfaction.     Scarlette Shorts

## 2021-04-02 NOTE — Op Note (Signed)
[facility] Patient Name: Matthew Ferguson Procedure Date : 04/02/2021 MRN: [id number] Attending MD: Docia Chuck. Henrene Pastor , MD Date of Birth: 12-02-1945 CSN: [id number] Age: 75 Admit Type: Inpatient Procedure:                Colonoscopy Indications:              Rectal bleeding Providers:                Docia Chuck. Henrene Pastor, MD, Dulcy Fanny, Elspeth Cho                            Tech., Technician, Viann Fish, CRNA Referring MD:             [facility] hospitalist Medicines:                Monitored Anesthesia Care Complications:            No immediate complications. Estimated blood loss:                            None. Estimated Blood Loss:     Estimated blood loss: none. Procedure:                Pre-Anesthesia Assessment:                           - Prior to the procedure, a History and Physical                            was performed, and patient medications and                            allergies were reviewed. The patient's tolerance of                            previous anesthesia was also reviewed. The risks                            and benefits of the procedure and the sedation                            options and risks were discussed with the patient.                            All questions were answered, and informed consent                            was obtained. Prior Anticoagulants: The patient has                            taken no previous anticoagulant or antiplatelet                            agents. ASA Grade Assessment: III - A patient with  severe systemic disease. After reviewing the risks                            and benefits, the patient was deemed in                            satisfactory condition to undergo the procedure.                           After obtaining informed consent, the colonoscope                            was passed under direct vision. Throughout the                            procedure, the  patient's blood pressure, pulse, and                            oxygen saturations were monitored continuously. The                            CF-HQ190L ([id number]) Olympus colonoscope was                            introduced through the anus and advanced to the the                            cecum, identified by appendiceal orifice and                            ileocecal valve. The ileocecal valve, appendiceal                            orifice, and rectum were photographed. The quality                            of the bowel preparation was excellent. The                            colonoscopy was performed without difficulty. The                            patient tolerated the procedure well. The bowel                            preparation used was MoviPrep via split dose                            instruction. Scope In: 9:31:55 AM Scope Out: 9:52:46 AM Scope Withdrawal Time: 0 hours 6 minutes 24 seconds  Total Procedure Duration: 0 hours 20 minutes 51 seconds  Findings:      Multiple diverticula were found in the left colon. Mild portal       hypertensive colopathy.      Internal hemorrhoids were found  during retroflexion. The hemorrhoids       were moderate.      The exam was otherwise without abnormality on direct and retroflexion       views. Impression:               - Diverticulosis in the left colon.                           - Mild portal hypertensive colopathy                           - Internal hemorrhoids.                           - The examination was otherwise normal on direct                            and retroflexion views.                           - No specimens collected. Recommendation:           1. Resume previous diet.                           2. Continue present medications.                           3. Okay for discharge home today.                           4. Discharge home on current medications. Do not                            resume diuretics at  this point                           5. Our office will arrange for outpatient follow-up                            with Dr. Loletha Carrow in a couple of weeks                           6. Okay for discharge home today                           7. Discussed with the patient's wife Fraser Din. Also                            discussed with the patient. A copy of this report                            has been provided to them..                           GI will sign off. Thank you Procedure Code(s):        --- Professional ---  45378, Colonoscopy, flexible; diagnostic, including                            collection of specimen(s) by brushing or washing,                            when performed (separate procedure) Diagnosis Code(s):        --- Professional ---                           K64.8, Other hemorrhoids                           K62.5, Hemorrhage of anus and rectum                           K57.30, Diverticulosis of large intestine without                            perforation or abscess without bleeding CPT copyright 2019 [employer]. All rights reserved. The codes documented in this report are preliminary and upon coder review may  be revised to meet current compliance requirements. Docia Chuck. Henrene Pastor, MD 04/02/2021 10:06:50 AM This report has been signed electronically. Number of Addenda: 0

## 2021-04-02 NOTE — Discharge Summary (Signed)
Physician Discharge Summary  Dorrance Sellick XBJ:[id number] DOB: Dec 09, 1945 DOA: 03/31/2021  PCP: Cher Nakai, MD  Admit date: 03/31/2021 Discharge date: 04/02/2021  Admitted From: Home Disposition: Home  Recommendations for Outpatient Follow-up:  1. Follow ups as below. 2. Please obtain CBC/BMP/Mag at follow up 3. Please follow up on the following pending results: None  Home Health: None required Equipment/Devices: None required   Discharge Condition: Stable CODE STATUS: DNR/DNI   Follow-up Information    Cher Nakai, MD Follow up.   Specialty: Internal Medicine Contact information: [place] [address] [phone]        Richardo Priest, MD. Schedule an appointment as soon as possible for a visit in 2 week(s).   Specialties: Cardiology, Radiology Contact information: [place] [address] [phone]                Hospital Course: 75 year old M with PMH of CAD s/p stents in 2013, liver cirrhosis with ascites,  hepatitis C, cholecystectomy, DM-2, HTN and HLD presented to [facility] with progressive dyspnea, fatigue, lightheadedness, hypersomnolence, melena, BRBPR and unintentional 20 pound weight loss in 3 months.  Initial hemoglobin was 6.9.  He was transfused 2 units, and transferred here for GI evaluation.  He also had elevated K25.7 And lipase to 306.  His LFT was within normal. Cr 2.8 with BUN of 40.  On arrival here, hemodynamically stable.  Hgb 8.8.  Hyperkalemia resolved.  Creatinine improved. EGD on 5/30 with grade II esophageal varices, portal hypertensive gastropathy and normal duodenum.  Colonoscopy on 5/31 without significant finding other than internal hemorrhoids.    On the day of discharge, H&H remained stable.  Symptoms improved.  Tolerated regular diet.  He felt well and ready to go home.  Cleared for discharge by gastroenterology.  Discharged on p.o. Protonix and as needed Anusol cream.  Also changed  metoprolol to nadolol given his esophageal varices and portal hypertensive gastropathy.  GI to arrange outpatient follow-up.  Discharge Diagnoses:  Symptomatic acute on chronic blood loss anemia-reports intermittent melena and BRBPR but no large-volume bleeding. Hgb 6.9 at [facility].   Has history of liver cirrhosis with ascites likely due to hepatitis C.  Had paracentesis a couple of times earlier this year.  Denies NSAID use.  He is not on blood thinner.  Transfused 2 units with appropriate response.  EGD and colonoscopy as above. Recent Labs    03/31/21 0659 03/31/21 1451 04/01/21 0139 04/01/21 1729 04/02/21 0225  HGB 8.8* 9.0* 7.7* 8.3* 8.0*  -P.o. Protonix 40 mg daily -Anusol suppositories as needed -Recheck CBC at follow-up.  History of liver cirrhosis with ascites and esophageal varices: EGD findings as above. History of hepatitis C? HCV RNA quant undetectable -AFP tumor marker negative -Changed metoprolol to nadolol -Reduced on Lasix to 20 mg daily -He may need Aldactone going forward. -Outpatient follow-up with GI  CAD s/p stents in 2013: dyspnea and fatigue likely from anemia versus ACS.  No chest pain. -Continue home Imdur -Changed metoprolol to nadolol in the setting of liver cirrhosis  AKI/azotemia on CKD-3B? Cr/BUN 2.8/40 at [facility].  Unknown baseline.  Improving. Recent Labs    03/31/21 0659 03/31/21 1451 04/01/21 0139 04/02/21 1103  BUN 27* 29* 30* 24*  CREATININE 2.36* 2.34* 2.30* 2.06*  -Recheck renal function at follow-up. -Avoid nephrotoxic meds  Hyponatremia: Na 134.  Likely due to renal failure.  -Recheck at follow-up  Hyperkalemia: Resolved.  Non-anion gap metabolic acidosis-likely  due to renal failure.  Improved. -Recheck at follow-up  Diet controlled NIDDM-2: Not on meds. -Check hemoglobin A1c in 3 to 4 months after blood transfusion  Essential hypertension: Normotensive. -Nadolol and Imdur as  above  Hyperlipidemia -Continue home statin  Thrombocytopenia-could be due to cirrhosis.  Stable. Recent Labs  Lab 03/31/21 0659 04/01/21 0139 04/02/21 0225  PLT 142* 141* 144*  -Recheck at follow-up   Goal of care counseling-patient reports DNR/DNI.  He says his wife will be bringing his living will -CODE STATUS changed to DNR/DNI  Family communication:-Updated patient's wife at bedside  Unintentional weight loss-likely due to chronic liver and kidney disease.  Reportedly about 20 pounds weight loss in the last 3 months. Body mass index is 25.46 kg/m.  -Further work-up as outpatient          Discharge Exam: Vitals:   04/02/21 1021 04/02/21 1043  BP: 122/65 129/62  Pulse: 68 (!) 59  Resp: 15 18  Temp:  98 F (36.7 C)  SpO2: 100% 100%    GENERAL: No apparent distress.  Nontoxic. HEENT: MMM.  Vision and hearing grossly intact.  NECK: Supple.  No apparent JVD.  RESP:  No IWOB.  Fair aeration bilaterally. CVS:  RRR. Heart sounds normal.  ABD/GI/GU: Bowel sounds present. Soft. Non tender.  MSK/EXT:  Moves extremities. No apparent deformity. No edema.  SKIN: no apparent skin lesion or wound NEURO: Awake, alert and oriented appropriately.  No apparent focal neuro deficit. PSYCH: Calm. Normal affect.   Discharge Instructions  Discharge Instructions    Call MD for:  difficulty breathing, headache or visual disturbances   Complete by: As directed    Call MD for:  extreme fatigue   Complete by: As directed    Call MD for:  persistant dizziness or light-headedness   Complete by: As directed    Call MD for:  persistant nausea and vomiting   Complete by: As directed    Diet - low sodium heart healthy   Complete by: As directed    Discharge instructions   Complete by: As directed    It has been a pleasure taking care of you!  You were hospitalized with generalized weakness, lightheadedness, fatigue and shortness of breath likely due to blood loss from rectal  bleed.  You might have bled from esophageal varices (dilated blood vessels in your esophagus) likely due to your liver cirrhosis.  You also have internal hemorrhoid which might have bled.  We have treated you with blood transfusion.  Your blood level remained stable after blood transfusion.  We are discharging you on Protonix (pantoprazole).  We have also made some changes to your home blood pressure medications to reduce your risk of bleeding.  Please follow-up with your primary care doctor to have your blood level and kidney numbers rechecked.  Our gastroenterologist will arrange outpatient follow-up.   Take care,   Increase activity slowly   Complete by: As directed      Allergies as of 04/02/2021      Reactions   Anesthetics, Amide Other (See Comments)   UNKNOWN   Lidocaine Other (See Comments)   UNKNOWN      Medication List    STOP taking these medications   metoprolol tartrate 25 MG tablet Commonly known as: LOPRESSOR     TAKE these medications   acetaminophen 500 MG tablet Commonly known as: TYLENOL Take 500 mg by mouth every 6 (six) hours as needed for mild pain.   atorvastatin 10 MG  tablet Commonly known as: LIPITOR Take 10 mg by mouth daily.   fluticasone 50 MCG/ACT nasal spray Commonly known as: FLONASE Place 1 spray into both nostrils 2 (two) times daily as needed for congestion.   furosemide 20 MG tablet Commonly known as: LASIX Take 1 tablet (20 mg total) by mouth daily. What changed:   medication strength  how much to take   hydrocortisone 25 MG suppository Commonly known as: ANUSOL-HC Place 1 suppository (25 mg total) rectally 2 (two) times daily as needed for hemorrhoids or anal itching.   isosorbide mononitrate 30 MG 24 hr tablet Commonly known as: IMDUR Take 1 tablet (30 mg total) by mouth daily.   loperamide 2 MG capsule Commonly known as: IMODIUM Take 4 mg by mouth daily as needed for diarrhea or loose stools.   nadolol 40 MG  tablet Commonly known as: CORGARD Take 1 tablet (40 mg total) by mouth daily. Start taking on: April 03, 2021   nitroGLYCERIN 0.4 MG SL tablet Commonly known as: NITROSTAT DISSOLVE 1 TABLET UNDER THE TONGUE EVERY 5 MINUTES FOR UP TO 3 DOSES AS NEEDED FOR CHEST PAIN What changed:   how much to take  how to take this  when to take this  reasons to take this  additional instructions   pantoprazole 40 MG tablet Commonly known as: PROTONIX Take 1 tablet (40 mg total) by mouth 2 (two) times daily.   vitamin B-12 1000 MCG tablet Commonly known as: CYANOCOBALAMIN Take 1,000 mcg by mouth daily.       Consultations:  Gastroenterology  Procedures/Studies:  EGD on 5/30 with grade II esophageal varices, portal hypertensive gastropathy and normal duodenum.   Colonoscopy on 5/31 without significant finding other than internal hemorrhoids.     US Abdomen Complete  Result Date: 03/31/2021 CLINICAL DATA:  Cirrhosis EXAM: ABDOMEN ULTRASOUND COMPLETE COMPARISON:  08/27/2020 FINDINGS: Gallbladder: Surgically absent Common bile duct: Diameter: 5 mm Liver: Heterogeneous coarsened liver echotexture and capsular nodularity compatible with known history of cirrhosis. No focal liver abnormalities are identified. No biliary duct dilation. Portal vein is patent on color Doppler imaging with normal direction of blood flow towards the liver. IVC: No abnormality visualized. Pancreas: Portions of the head and body of the pancreas are unremarkable. The remainder of the pancreas is obscured by bowel gas. Spleen: Borderline splenomegaly measuring up to 12.2 cm in craniocaudal length. No focal abnormalities. Right Kidney: Length: 10.0 cm. Echogenicity within normal limits. No mass or hydronephrosis visualized. Left Kidney: Length: 11.2 cm. Echogenicity within normal limits. No mass or hydronephrosis visualized. Abdominal aorta: No aneurysm visualized. Other findings: There is trace ascites within the bilateral  upper quadrants, right greater than left. Volume of ascites is insufficient for paracentesis. IMPRESSION: 1. Hepatic cirrhosis. 2. Borderline splenomegaly. 3. Trace upper abdominal ascites. Electronically Signed   By: Randa Ngo M.D.   On: 03/31/2021 20:23        The results of significant diagnostics from this hospitalization (including imaging, microbiology, ancillary and laboratory) are listed below for reference.     Microbiology: No results found for this or any previous visit (from the past 240 hour(s)).   Labs:  CBC: Recent Labs  Lab 03/31/21 0659 03/31/21 1451 04/01/21 0139 04/01/21 1729 04/02/21 0225  WBC 5.9  --  5.2  --  5.3  NEUTROABS 3.6  --   --   --   --   HGB 8.8* 9.0* 7.7* 8.3* 8.0*  HCT 26.9* 27.8* 24.3* 25.7* 25.1*  MCV 93.7  --  95.3  --  95.1  PLT 142*  --  141*  --  144*   BMP &GFR Recent Labs  Lab 03/31/21 0659 03/31/21 1451 04/01/21 0139 04/02/21 1103  NA 137 135 134* 134*  K 4.8 5.1 4.8 4.0  CL 112* 109 107 106  CO2 19* 17* 19* 19*  GLUCOSE 119* 143* 104* 109*  BUN 27* 29* 30* 24*  CREATININE 2.36* 2.34* 2.30* 2.06*  CALCIUM 9.1 9.1 9.0 9.4  MG  --  1.9 1.8 1.7  PHOS  --  4.0 4.0 4.0   Estimated Creatinine Clearance: 30.5 mL/min (A) (by C-G formula based on SCr of 2.06 mg/dL (H)). Liver & Pancreas: Recent Labs  Lab 03/31/21 0659 03/31/21 1451 04/01/21 0139 04/02/21 1103  AST 22  --   --   --   ALT 15  --   --   --   ALKPHOS 76  --   --   --   BILITOT 2.3*  --   --   --   PROT 6.1*  --   --   --   ALBUMIN 2.8* 2.8* 2.6* 3.0*   Recent Labs  Lab 03/31/21 0946  LIPASE 41   Recent Labs  Lab 03/31/21 0946  AMMONIA 55*   Diabetic: Recent Labs    03/31/21 0659  HGBA1C 6.7*   Recent Labs  Lab 04/01/21 1158 04/01/21 1821 04/01/21 2345 04/02/21 0606 04/02/21 1145  GLUCAP 176* 116* 132* 104* 117*   Cardiac Enzymes: No results for input(s): CKTOTAL, CKMB, CKMBINDEX, TROPONINI in the last 168 hours. No results  for input(s): PROBNP in the last 8760 hours. Coagulation Profile: Recent Labs  Lab 04/01/21 0139  INR 1.4*   Thyroid Function Tests: No results for input(s): TSH, T4TOTAL, FREET4, T3FREE, THYROIDAB in the last 72 hours. Lipid Profile: No results for input(s): CHOL, HDL, LDLCALC, TRIG, CHOLHDL, LDLDIRECT in the last 72 hours. Anemia Panel: No results for input(s): VITAMINB12, FOLATE, FERRITIN, TIBC, IRON, RETICCTPCT in the last 72 hours. Urine analysis: No results found for: COLORURINE, APPEARANCEUR, LABSPEC, PHURINE, GLUCOSEU, HGBUR, BILIRUBINUR, KETONESUR, PROTEINUR, UROBILINOGEN, NITRITE, LEUKOCYTESUR Sepsis Labs: Invalid input(s): PROCALCITONIN, LACTICIDVEN   Time coordinating discharge: 40 minutes  SIGNED:  Mercy Riding, MD  [facility] 04/02/2021, 6:47 PM  If 7PM-7AM, please contact night-coverage [email or web address]

## 2021-04-02 NOTE — Evaluation (Signed)
Physical Therapy Evaluation Patient Details Name: Matthew Ferguson MRN: [id number] DOB: May 28, 1946 Today's Date: 04/02/2021   History of Present Illness  Pt is a 75 y/o male admitted secondary to symptomatic anemia secondary to melena on 5/29. Pt is s/p EGD on 5/30 and s/p colonoscopy on 5/31. PMH includes CAD, HTN, DM, and hepatitis C.  Clinical Impression  Pt admitted secondary to problem above with deficits below. Unsteady during gait without AD and required min guard to min A. Educated about using RW at home to increase safety with mobility. Discussed HHPT, however, pt reports he doesn't feel like he needs it right now. Pt reports wife can assist if needed. Eager to d/c home today. Will continue to follow acutely to maximize functional mobility independence and safety.     Follow Up Recommendations No PT follow up (pt refusing HHPT)    Equipment Recommendations  None recommended by PT    Recommendations for Other Services       Precautions / Restrictions Precautions Precautions: Fall Restrictions Weight Bearing Restrictions: No      Mobility  Bed Mobility Overal bed mobility: Modified Independent                  Transfers Overall transfer level: Needs assistance Equipment used: None Transfers: Sit to/from Stand Sit to Stand: Min guard         General transfer comment: Min guard for safety.  Ambulation/Gait Ambulation/Gait assistance: Min guard;Min assist Gait Distance (Feet): 120 Feet Assistive device: None Gait Pattern/deviations: Drifts right/left;Step-through pattern;Decreased stride length;Narrow base of support Gait velocity: Decreased   General Gait Details: Unsteady gait without AD and required min guard to min A for steadying. Educated about using RW at home for increased safety.  Stairs            Wheelchair Mobility    Modified Rankin (Stroke Patients Only)       Balance Overall balance assessment: Needs assistance Sitting-balance  support: No upper extremity supported;Feet supported Sitting balance-Leahy Scale: Fair     Standing balance support: No upper extremity supported;During functional activity Standing balance-Leahy Scale: Fair Standing balance comment: Able to maintain static standing without UE support                             Pertinent Vitals/Pain Pain Assessment: Faces Faces Pain Scale: Hurts a little bit Pain Location: abdomen Pain Descriptors / Indicators: Aching Pain Intervention(s): Limited activity within patient's tolerance;Monitored during session;Repositioned    Home Living Family/patient expects to be discharged to:: Private residence Living Arrangements: Spouse/significant other Available Help at Discharge: Family Type of Home: House Home Access: Level entry     Home Layout: One level Home Equipment: Civil engineer, contracting;Wheelchair - [employer] - 2 wheels;Cane - single point      Prior Function Level of Independence: Independent               Hand Dominance        Extremity/Trunk Assessment   Upper Extremity Assessment Upper Extremity Assessment: Defer to OT evaluation    Lower Extremity Assessment Lower Extremity Assessment: Generalized weakness    Cervical / Trunk Assessment Cervical / Trunk Assessment: Kyphotic  Communication   Communication: No difficulties  Cognition Arousal/Alertness: Awake/alert Behavior During Therapy: WFL for tasks assessed/performed Overall Cognitive Status: Within Functional Limits for tasks assessed  General Comments      Exercises     Assessment/Plan    PT Assessment Patient needs continued PT services  PT Problem List Decreased strength;Decreased balance;Decreased mobility       PT Treatment Interventions DME instruction;Gait training;Functional mobility training;Therapeutic activities;Therapeutic exercise;Balance training;Patient/family education    PT  Goals (Current goals can be found in the Care Plan section)  Acute Rehab PT Goals Patient Stated Goal: to go home PT Goal Formulation: With patient Time For Goal Achievement: 04/16/21 Potential to Achieve Goals: Good    Frequency Min 3X/week   Barriers to discharge        Co-evaluation               AM-PAC PT "6 Clicks" Mobility  Outcome Measure Help needed turning from your back to your side while in a flat bed without using bedrails?: None Help needed moving from lying on your back to sitting on the side of a flat bed without using bedrails?: None Help needed moving to and from a bed to a chair (including a wheelchair)?: A Little Help needed standing up from a chair using your arms (e.g., wheelchair or bedside chair)?: A Little Help needed to walk in hospital room?: A Little Help needed climbing 3-5 steps with a railing? : A Little 6 Click Score: 20    End of Session Equipment Utilized During Treatment: Gait belt Activity Tolerance: Patient tolerated treatment well Patient left: in bed;with call bell/phone within reach;with family/visitor present Nurse Communication: Mobility status PT Visit Diagnosis: Unsteadiness on feet (R26.81);Muscle weakness (generalized) (M62.81)    Time: 8372-9021 PT Time Calculation (min) (ACUTE ONLY): 15 min   Charges:   PT Evaluation $PT Eval Low Complexity: 1 Low          Lou Miner, DPT  Acute Rehabilitation Services  Pager: [phone] Office: [phone]   Rudean Hitt 04/02/2021, 11:47 AM

## 2021-04-02 NOTE — Anesthesia Procedure Notes (Signed)
Procedure Name: MAC Date/Time: 04/02/2021 9:20 AM Performed by: Griffin Dakin, CRNA Pre-anesthesia Checklist: Patient identified, Emergency Drugs available, Suction available, Patient being monitored and Timeout performed Patient Re-evaluated:Patient Re-evaluated prior to induction Oxygen Delivery Method: Simple face mask Induction Type: IV induction Placement Confirmation: positive ETCO2 and breath sounds checked- equal and bilateral Dental Injury: Teeth and Oropharynx as per pre-operative assessment

## 2021-04-02 NOTE — Progress Notes (Signed)
Pt off the unit to endo 

## 2021-04-02 NOTE — Anesthesia Preprocedure Evaluation (Signed)
Anesthesia Evaluation  Patient identified by MRN, date of birth, ID band Patient awake    Reviewed: Allergy & Precautions, NPO status , Patient's Chart, lab work & pertinent test results  Airway Mallampati: II  TM Distance: >3 FB Neck ROM: Full    Dental  (+) Teeth Intact, Dental Advisory Given   Pulmonary    breath sounds clear to auscultation       Cardiovascular hypertension,  Rhythm:Regular Rate:Normal     Neuro/Psych    GI/Hepatic   Endo/Other  diabetes  Renal/GU      Musculoskeletal   Abdominal   Peds  Hematology   Anesthesia Other Findings   Reproductive/Obstetrics                             Anesthesia Physical Anesthesia Plan  ASA: III  Anesthesia Plan:    Post-op Pain Management:    Induction: Intravenous  PONV Risk Score and Plan: Ondansetron and Propofol infusion  Airway Management Planned: Natural Airway and Simple Face Mask  Additional Equipment:   Intra-op Plan:   Post-operative Plan:   Informed Consent: I have reviewed the patients History and Physical, chart, labs and discussed the procedure including the risks, benefits and alternatives for the proposed anesthesia with the patient or authorized representative who has indicated his/her understanding and acceptance.     Dental advisory given  Plan Discussed with: CRNA and Anesthesiologist  Anesthesia Plan Comments:         Anesthesia Quick Evaluation

## 2021-04-02 NOTE — Interval H&P Note (Signed)
History and Physical Interval Note:  04/02/2021 9:10 AM  Matthew Ferguson  has presented today for surgery, with the diagnosis of hematochezia.  The various methods of treatment have been discussed with the patient and family. After consideration of risks, benefits and other options for treatment, the patient has consented to  Procedure(s): COLONOSCOPY WITH PROPOFOL (N/A) as a surgical intervention.  The patient's history has been reviewed, patient examined, no change in status, stable for surgery.  I have reviewed the patient's chart and labs.  Questions were answered to the patient's satisfaction.     Scarlette Shorts

## 2021-04-03 ENCOUNTER — Encounter (HOSPITAL_COMMUNITY): Payer: Self-pay | Admitting: Internal Medicine

## 2021-04-04 LAB — HEPATITIS C GENOTYPE

## 2021-04-05 ENCOUNTER — Encounter (HOSPITAL_COMMUNITY): Payer: Self-pay | Admitting: Physician Assistant

## 2021-04-09 ENCOUNTER — Telehealth: Payer: Self-pay

## 2021-04-09 NOTE — Telephone Encounter (Signed)
Pt scheduled to see Dr. Loletha Carrow 04/23/21 at 4pm. Left message for pt to call back.

## 2021-04-09 NOTE — Telephone Encounter (Signed)
-----   Message from Irene Shipper, MD sent at 04/02/2021 10:23 AM EDT ----- Regarding: Hospital patient needs follow-up with Dr. Georgette Shell, Hospital patient to be discharged today.  Needs follow-up with Dr. Loletha Carrow in a couple of weeks.  Principal diagnosis is hepatic cirrhosis.  Thanks JP

## 2021-04-10 NOTE — Telephone Encounter (Signed)
Appt letter mailed to pt. 

## 2021-04-16 DIAGNOSIS — D509 Iron deficiency anemia, unspecified: Secondary | ICD-10-CM | POA: Diagnosis not present

## 2021-04-18 DIAGNOSIS — K7460 Unspecified cirrhosis of liver: Secondary | ICD-10-CM | POA: Diagnosis not present

## 2021-04-18 DIAGNOSIS — D649 Anemia, unspecified: Secondary | ICD-10-CM | POA: Diagnosis not present

## 2021-04-18 DIAGNOSIS — R634 Abnormal weight loss: Secondary | ICD-10-CM | POA: Diagnosis not present

## 2021-04-23 ENCOUNTER — Ambulatory Visit (INDEPENDENT_AMBULATORY_CARE_PROVIDER_SITE_OTHER): Payer: Medicare Other | Admitting: Gastroenterology

## 2021-04-23 ENCOUNTER — Other Ambulatory Visit (INDEPENDENT_AMBULATORY_CARE_PROVIDER_SITE_OTHER): Payer: Medicare Other

## 2021-04-23 ENCOUNTER — Encounter: Payer: Self-pay | Admitting: Gastroenterology

## 2021-04-23 VITALS — BP 100/60 | HR 56 | Ht 67.5 in | Wt 187.4 lb

## 2021-04-23 DIAGNOSIS — D631 Anemia in chronic kidney disease: Secondary | ICD-10-CM

## 2021-04-23 DIAGNOSIS — R634 Abnormal weight loss: Secondary | ICD-10-CM

## 2021-04-23 DIAGNOSIS — K7460 Unspecified cirrhosis of liver: Secondary | ICD-10-CM

## 2021-04-23 DIAGNOSIS — I8510 Secondary esophageal varices without bleeding: Secondary | ICD-10-CM | POA: Diagnosis not present

## 2021-04-23 DIAGNOSIS — N189 Chronic kidney disease, unspecified: Secondary | ICD-10-CM

## 2021-04-23 DIAGNOSIS — R188 Other ascites: Secondary | ICD-10-CM

## 2021-04-23 DIAGNOSIS — I25118 Atherosclerotic heart disease of native coronary artery with other forms of angina pectoris: Secondary | ICD-10-CM | POA: Diagnosis not present

## 2021-04-23 LAB — CBC WITH DIFFERENTIAL/PLATELET
Basophils Absolute: 0 10*3/uL (ref 0.0–0.1)
Basophils Relative: 0.6 % (ref 0.0–3.0)
Eosinophils Absolute: 0.1 10*3/uL (ref 0.0–0.7)
Eosinophils Relative: 3 % (ref 0.0–5.0)
HCT: 29.2 % — ABNORMAL LOW (ref 39.0–52.0)
Hemoglobin: 9.9 g/dL — ABNORMAL LOW (ref 13.0–17.0)
Lymphocytes Relative: 30.8 % (ref 12.0–46.0)
Lymphs Abs: 1.5 10*3/uL (ref 0.7–4.0)
MCHC: 33.9 g/dL (ref 30.0–36.0)
MCV: 97.3 fl (ref 78.0–100.0)
Monocytes Absolute: 0.6 10*3/uL (ref 0.1–1.0)
Monocytes Relative: 12.7 % — ABNORMAL HIGH (ref 3.0–12.0)
Neutro Abs: 2.5 10*3/uL (ref 1.4–7.7)
Neutrophils Relative %: 52.9 % (ref 43.0–77.0)
Platelets: 111 10*3/uL — ABNORMAL LOW (ref 150.0–400.0)
RBC: 3 Mil/uL — ABNORMAL LOW (ref 4.22–5.81)
RDW: 19.8 % — ABNORMAL HIGH (ref 11.5–15.5)
WBC: 4.8 10*3/uL (ref 4.0–10.5)

## 2021-04-23 LAB — IBC + FERRITIN
Ferritin: 112.4 ng/mL (ref 22.0–322.0)
Iron: 66 ug/dL (ref 42–165)
Saturation Ratios: 24.9 % (ref 20.0–50.0)
Transferrin: 189 mg/dL — ABNORMAL LOW (ref 212.0–360.0)

## 2021-04-23 NOTE — Progress Notes (Signed)
[name] Progress Note  Chief Complaint: Cirrhosis, anemia and esophageal varices  Subjective  History: This is a 75 year old man I recently saw in hospital consultation when he was transferred from [facility] for rectal bleeding and what appeared to be acute on chronic anemia.  On admission to [facility] he also had a sodium of 130 and a potassium of 5.7 with EKG changes, treated with insulin and calcium.  He has CKD and has not been under the care of of nephrology.  Patient reported about a 20 pound weight loss in recent months, only some of which seem to be accounted for by having had paracentesis on at least 2 occasions starting in November or December 2021.  He had not needed paracentesis for several months, and no ascites was found on recent inpatient ultrasound.  IV iron given. My initial hospital consult note details as history.  Briefly, he has been under the care of Dr. Lyda Jester in [place] for cirrhosis and reportedly a history of hepatitis C, though the patient had little information about that and said it had not been treated.  Upper endoscopy revealed no bleeding source, but grade 2 esophageal varices were found.  Discharge summary indicates that his previous metoprolol was changed to nadolol for primary variceal prophylaxis. Colonoscopy by Dr. Henrene Pastor the following day had an excellent preparation, left-sided diverticulosis with mild portal colopathy and internal hemorrhoids.  Dr Henrene Pastor recommended not resuming diuretics after discharge. Mr. Bozzi wanted to transfer his care to me after discharge.   Cheyne was here with his wife.  He says his appetite has improved and his weight is picking up since the recent hospitalization.  He was having nausea for months before and he felt that was affecting his appetite and leading to weight loss, and that now seems better for unclear reasons.  His wife says he seems forgetful, perhaps more so in the last year than before.  He has  not had episodes of confusion.  He naps regularly, but this has not been a change for him. He has joint pain as before. Neno has an appointment with Dr. Crisoforo Oxford office tomorrow for a B12 injection.  He still plans to transfer his care to me going forward.  He also is not seen primary care since hospital discharge, and I encouraged him to do so for best care coordination, especially given the events of recent hospital stay and changes to his medicine regimen. ROS: Cardiovascular:  no chest pain Respiratory: no dyspnea  The patient's Past Medical, Family and Social History were reviewed and are on file in the EMR.  Objective:  Med list reviewed  Current Outpatient Medications:    acetaminophen (TYLENOL) 500 MG tablet, Take 500 mg by mouth as needed for mild pain., Disp: , Rfl:    fluticasone (FLONASE) 50 MCG/ACT nasal spray, Place 1 spray into both nostrils as needed for congestion., Disp: , Rfl:    hydrocortisone (ANUSOL-HC) 25 MG suppository, Place 1 suppository (25 mg total) rectally 2 (two) times daily as needed for hemorrhoids or anal itching. (Patient taking differently: Place 25 mg rectally as needed for hemorrhoids or anal itching.), Disp: 12 suppository, Rfl: 1   loperamide (IMODIUM) 2 MG capsule, Take 4 mg by mouth as needed for diarrhea or loose stools., Disp: , Rfl:    nadolol (CORGARD) 40 MG tablet, Take 1 tablet (40 mg total) by mouth daily., Disp: 90 tablet, Rfl: 1   pantoprazole (PROTONIX) 40 MG tablet, Take 1 tablet (40  mg total) by mouth 2 (two) times daily., Disp: 90 tablet, Rfl: 0   nitroGLYCERIN (NITROSTAT) 0.4 MG SL tablet, DISSOLVE 1 TABLET UNDER THE TONGUE EVERY 5 MINUTES FOR UP TO 3 DOSES AS NEEDED FOR CHEST PAIN (Patient not taking: Reported on 04/23/2021), Disp: 25 tablet, Rfl: 1   Vital signs in last 24 hrs: Vitals:   04/23/21 1536  BP: 100/60  Pulse: (!) 56   Wt Readings from Last 3 Encounters:  04/23/21 187 lb 6 oz (85 kg)  04/02/21 172 lb 6.4 oz (78.2  kg)  12/11/20 167 lb 9.6 oz (76 kg)    Physical Exam  Antalgic but steady gait, gets on exam table without assistance. HEENT: sclera anicteric, oral mucosa moist without lesions Neck: supple, no thyromegaly, JVD or lymphadenopathy Cardiac: RRR without murmurs, S1S2 heard, mild pretibial edema Pulm: clear to auscultation bilaterally, normal RR and effort noted Abdomen: soft, no tenderness, with active bowel sound.  Left lobe liver mildly enlarged, no spleen tip palpable.  No obvious ascites on exam. Skin; warm and dry, no jaundice or rash Neuro: No asterixis, no nystagmus, strength normal and symmetrical in major muscle groups.  Speech fluent, normal affect.  Labs:  CBC Latest Ref Rng & Units 04/02/2021 04/01/2021 04/01/2021  WBC 4.0 - 10.5 K/uL 5.3 - 5.2  Hemoglobin 13.0 - 17.0 g/dL 8.0(L) 8.3(L) 7.7(L)  Hematocrit 39.0 - 52.0 % 25.1(L) 25.7(L) 24.3(L)  Platelets 150 - 400 K/uL 144(L) - 141(L)   CMP Latest Ref Rng & Units 04/02/2021 04/01/2021 03/31/2021  Glucose 70 - 99 mg/dL 109(H) 104(H) 143(H)  BUN 8 - 23 mg/dL 24(H) 30(H) 29(H)  Creatinine 0.61 - 1.24 mg/dL 2.06(H) 2.30(H) 2.34(H)  Sodium 135 - 145 mmol/L 134(L) 134(L) 135  Potassium 3.5 - 5.1 mmol/L 4.0 4.8 5.1  Chloride 98 - 111 mmol/L 106 107 109  CO2 22 - 32 mmol/L 19(L) 19(L) 17(L)  Calcium 8.9 - 10.3 mg/dL 9.4 9.0 9.1  Total Protein 6.5 - 8.1 g/dL - - -  Total Bilirubin 0.3 - 1.2 mg/dL - - -  Alkaline Phos 38 - 126 U/L - - -  AST 15 - 41 U/L - - -  ALT 0 - 44 U/L - - -   INR 1.4  Total bilirubin 2.3, remainder LFTs normal, albumin 2.8  Recent viral hepatitis panel:  Positive total hepatitis A antibody.  Negative hepatitis B surface antigen and surface antibody. Negative hepatitis C Viral load by PCR  AFP normal at 1.6 ___________________________________________ Radiologic studies:  CLINICAL DATA:  Cirrhosis   EXAM: ABDOMEN ULTRASOUND COMPLETE   COMPARISON:  08/27/2020   FINDINGS: Gallbladder: Surgically  absent   Common bile duct: Diameter: 5 mm   Liver: Heterogeneous coarsened liver echotexture and capsular nodularity compatible with known history of cirrhosis. No focal liver abnormalities are identified. No biliary duct dilation. Portal vein is patent on color Doppler imaging with normal direction of blood flow towards the liver.   IVC: No abnormality visualized.   Pancreas: Portions of the head and body of the pancreas are unremarkable. The remainder of the pancreas is obscured by bowel gas.   Spleen: Borderline splenomegaly measuring up to 12.2 cm in craniocaudal length. No focal abnormalities.   Right Kidney: Length: 10.0 cm. Echogenicity within normal limits. No mass or hydronephrosis visualized.   Left Kidney: Length: 11.2 cm. Echogenicity within normal limits. No mass or hydronephrosis visualized.   Abdominal aorta: No aneurysm visualized.   Other findings: There is trace ascites within the  bilateral upper quadrants, right greater than left. Volume of ascites is insufficient for paracentesis.   IMPRESSION: 1. Hepatic cirrhosis. 2. Borderline splenomegaly. 3. Trace upper abdominal ascites.     Electronically Signed   By: Randa Ngo M.D.   On: 03/31/2021 20:23  ____________________________________________ Other:   _____________________________________________ Assessment & Plan  Assessment: Encounter Diagnoses  Name Primary?   Cirrhosis of liver with ascites, unspecified hepatic cirrhosis type ([place]) Yes   Secondary esophageal varices without bleeding (HCC)    Weight loss    Anemia in chronic kidney disease, unspecified CKD stage    Other ascites     Cryptogenic cirrhosis.  He had diabetes, but his medicines were stopped at some point and he is not sure he needs them any longer.  He said a recent blood sugar check was 120-130.  This is another reason I encouraged him to follow-up with primary care.  But that makes me wonder if he could have had fatty  liver that progressed to cirrhosis.  We discussed the possible complications of varices (which he has but are nonbleeding), ascites (which would likely recur since he is off diuretics), hepatic encephalopathy and liver cancer. He is not clinically encephalopathic, but I do wonder if his loss of appetite and some sleep disturbance could have been subtle signs of encephalopathy.  I have asked his wife to let us know if he develops change in mental status or recurrence of the nausea with appetite loss and weight loss, as I might electively put him on rifaximin in that case.  I feel that his diuretics would be best managed by a nephrologist because he has CKD.  I also think his anemia is likely to be largely due to chronic kidney disease and he is probably not been getting sufficient iron supplementation.  He might need Epogen once iron has been repleted.   Plan: CBC, iron studies, B12 and folate today.  Nephrology consult sent to [facility]  See primary care soon.  I will send my note to them for continuity of care.  Continue nadolol for primary variceal prophylaxis.,  Pulse currently in 50s  See me in 2 months.  Asked him and his wife to contact us with any concerns in the meantime.  40 minutes were spent on this encounter (including chart review, history/exam, counseling/coordination of care, and documentation) > 50% of that time was spent on counseling and coordination of care.  Topics discussed included: See above. (Extensive chart review and documentation required given complexity of condition and ongoing work-up and treatment since I last saw him in the hospital) [name]

## 2021-04-23 NOTE — Patient Instructions (Signed)
If you are age 77 or older, your body mass index should be between 23-30. Your Body mass index is 28.91 kg/m. If this is out of the aforementioned range listed, please consider follow up with your Primary Care Provider.  If you are age 1 or younger, your body mass index should be between 19-25. Your Body mass index is 28.91 kg/m. If this is out of the aformentioned range listed, please consider follow up with your Primary Care Provider.   __________________________________________________________  The [facility] providers would like to encourage you to use [facility] to communicate with providers for non-urgent requests or questions.  Due to long hold times on the telephone, sending your provider a message by [facility] may be a faster and more efficient way to get a response.  Please allow 48 business hours for a response.  Please remember that this is for non-urgent requests.   Your provider has requested that you go to the basement level for lab work before leaving today. Press "B" on the elevator. The lab is located at the first door on the left as you exit the elevator.  Due to recent changes in healthcare laws, you may see the results of your imaging and laboratory studies on MyChart before your provider has had a chance to review them.  We understand that in some cases there may be results that are confusing or concerning to you. Not all laboratory results come back in the same time frame and the provider may be waiting for multiple results in order to interpret others.  Please give [place] 48 hours in order for your provider to thoroughly review all the results before contacting the office for clarification of your results.   It was a pleasure to see you today!  Thank you for trusting me with your gastrointestinal care!

## 2021-04-24 ENCOUNTER — Telehealth: Payer: Self-pay

## 2021-04-24 DIAGNOSIS — D509 Iron deficiency anemia, unspecified: Secondary | ICD-10-CM | POA: Diagnosis not present

## 2021-04-24 DIAGNOSIS — K5730 Diverticulosis of large intestine without perforation or abscess without bleeding: Secondary | ICD-10-CM | POA: Diagnosis not present

## 2021-04-24 LAB — VITAMIN B12: Vitamin B-12: 1318 pg/mL — ABNORMAL HIGH (ref 211–911)

## 2021-04-24 LAB — FOLATE: Folate: >24.4 ng/mL (ref >5.9)

## 2021-04-24 NOTE — Telephone Encounter (Signed)
Records have been faxed to [facility].

## 2021-04-30 ENCOUNTER — Other Ambulatory Visit: Payer: Self-pay | Admitting: Cardiology

## 2021-05-08 NOTE — Progress Notes (Signed)
Patient in office today to pick-up diabetic shoes. Patient seen by EJ with [facility] who educated patient on the break-in process. The shoes were tried on and patient was satisfied with the fit. Advised patient to call the office with any questions, comments, or concerns. Patient verbalized understanding.  

## 2021-05-14 DIAGNOSIS — D631 Anemia in chronic kidney disease: Secondary | ICD-10-CM | POA: Diagnosis not present

## 2021-05-14 DIAGNOSIS — M25511 Pain in right shoulder: Secondary | ICD-10-CM | POA: Diagnosis not present

## 2021-05-14 DIAGNOSIS — E1122 Type 2 diabetes mellitus with diabetic chronic kidney disease: Secondary | ICD-10-CM | POA: Diagnosis not present

## 2021-05-14 DIAGNOSIS — G8929 Other chronic pain: Secondary | ICD-10-CM | POA: Diagnosis not present

## 2021-05-14 DIAGNOSIS — E538 Deficiency of other specified B group vitamins: Secondary | ICD-10-CM | POA: Diagnosis not present

## 2021-05-14 DIAGNOSIS — N184 Chronic kidney disease, stage 4 (severe): Secondary | ICD-10-CM | POA: Diagnosis not present

## 2021-05-14 DIAGNOSIS — I2510 Atherosclerotic heart disease of native coronary artery without angina pectoris: Secondary | ICD-10-CM | POA: Diagnosis not present

## 2021-05-16 NOTE — Telephone Encounter (Signed)
Appointment has been scheduled for 06-03-2021 with Dr Candiss Norse @ 11 am.

## 2021-05-28 DIAGNOSIS — N184 Chronic kidney disease, stage 4 (severe): Secondary | ICD-10-CM | POA: Diagnosis not present

## 2021-05-28 DIAGNOSIS — D649 Anemia, unspecified: Secondary | ICD-10-CM | POA: Diagnosis not present

## 2021-05-28 DIAGNOSIS — I25708 Atherosclerosis of coronary artery bypass graft(s), unspecified, with other forms of angina pectoris: Secondary | ICD-10-CM | POA: Diagnosis not present

## 2021-05-28 DIAGNOSIS — R079 Chest pain, unspecified: Secondary | ICD-10-CM | POA: Diagnosis not present

## 2021-05-28 DIAGNOSIS — E1122 Type 2 diabetes mellitus with diabetic chronic kidney disease: Secondary | ICD-10-CM | POA: Diagnosis not present

## 2021-06-06 DIAGNOSIS — R609 Edema, unspecified: Secondary | ICD-10-CM | POA: Diagnosis not present

## 2021-06-06 DIAGNOSIS — I129 Hypertensive chronic kidney disease with stage 1 through stage 4 chronic kidney disease, or unspecified chronic kidney disease: Secondary | ICD-10-CM | POA: Diagnosis not present

## 2021-06-06 DIAGNOSIS — N1832 Chronic kidney disease, stage 3b: Secondary | ICD-10-CM | POA: Diagnosis not present

## 2021-06-06 DIAGNOSIS — E1122 Type 2 diabetes mellitus with diabetic chronic kidney disease: Secondary | ICD-10-CM | POA: Diagnosis not present

## 2021-06-06 DIAGNOSIS — D631 Anemia in chronic kidney disease: Secondary | ICD-10-CM | POA: Diagnosis not present

## 2021-06-06 DIAGNOSIS — N2581 Secondary hyperparathyroidism of renal origin: Secondary | ICD-10-CM | POA: Diagnosis not present

## 2021-06-06 DIAGNOSIS — N189 Chronic kidney disease, unspecified: Secondary | ICD-10-CM | POA: Diagnosis not present

## 2021-06-06 DIAGNOSIS — K7460 Unspecified cirrhosis of liver: Secondary | ICD-10-CM | POA: Diagnosis not present

## 2021-06-12 ENCOUNTER — Ambulatory Visit (INDEPENDENT_AMBULATORY_CARE_PROVIDER_SITE_OTHER): Payer: Medicare Other | Admitting: Sports Medicine

## 2021-06-12 ENCOUNTER — Encounter: Payer: Self-pay | Admitting: Sports Medicine

## 2021-06-12 ENCOUNTER — Other Ambulatory Visit: Payer: Self-pay

## 2021-06-12 DIAGNOSIS — B351 Tinea unguium: Secondary | ICD-10-CM | POA: Diagnosis not present

## 2021-06-12 DIAGNOSIS — M79675 Pain in left toe(s): Secondary | ICD-10-CM | POA: Diagnosis not present

## 2021-06-12 DIAGNOSIS — M2142 Flat foot [pes planus] (acquired), left foot: Secondary | ICD-10-CM

## 2021-06-12 DIAGNOSIS — M2141 Flat foot [pes planus] (acquired), right foot: Secondary | ICD-10-CM

## 2021-06-12 DIAGNOSIS — E1141 Type 2 diabetes mellitus with diabetic mononeuropathy: Secondary | ICD-10-CM

## 2021-06-12 DIAGNOSIS — M79674 Pain in right toe(s): Secondary | ICD-10-CM | POA: Diagnosis not present

## 2021-06-12 DIAGNOSIS — M2040 Other hammer toe(s) (acquired), unspecified foot: Secondary | ICD-10-CM

## 2021-06-12 NOTE — Progress Notes (Signed)
Subjective: Matthew Ferguson is a 75 y.o. male patient with history of diabetes who presents to office today complaining of long,mildly painful nails  while ambulating in shoes; unable to trim.  Patient also request diabetic shoes.  Patient states that the glucose reading 115 last A1c does not know reports that he saw his PCP 2 weeks ago and states that after his last visit he had a heart attack.  Patient Active Problem List   Diagnosis Date Noted   Rectal bleeding    Diverticulosis of colon without hemorrhage    Acute blood loss anemia 04/01/2021   Symptomatic anemia 03/31/2021   Hepatitis-C 03/31/2021   Pre-op evaluation 09/09/2019   Coronary artery disease involving native coronary artery 05/29/2015   Essential hypertension 05/29/2015   Mixed hyperlipidemia 05/29/2015   Type 2 diabetes mellitus ([place]) 05/29/2015   Current Outpatient Medications on File Prior to Visit  Medication Sig Dispense Refill   acetaminophen (TYLENOL) 500 MG tablet Take 500 mg by mouth as needed for mild pain.     fluticasone (FLONASE) 50 MCG/ACT nasal spray Place 1 spray into both nostrils as needed for congestion.     hydrocortisone (ANUSOL-HC) 25 MG suppository Place 1 suppository (25 mg total) rectally 2 (two) times daily as needed for hemorrhoids or anal itching. (Patient taking differently: Place 25 mg rectally as needed for hemorrhoids or anal itching.) 12 suppository 1   loperamide (IMODIUM) 2 MG capsule Take 4 mg by mouth as needed for diarrhea or loose stools.     nadolol (CORGARD) 40 MG tablet Take 1 tablet (40 mg total) by mouth daily. 90 tablet 1   nitroGLYCERIN (NITROSTAT) 0.4 MG SL tablet DISSOLVE 1 TABLET UNDER THE TONGUE EVERY 5 MINUTES FOR UP TO 3 DOSES AS NEEDED FOR CHEST PAIN 25 tablet 7   pantoprazole (PROTONIX) 40 MG tablet Take 1 tablet (40 mg total) by mouth 2 (two) times daily. 90 tablet 0   No current facility-administered medications on file prior to visit.   Allergies  Allergen Reactions    Anesthetics, Amide Other (See Comments)    UNKNOWN   Lidocaine Other (See Comments)    UNKNOWN      Objective: General: Patient is awake, alert, and oriented x 3 and in no acute distress.  Integument: Skin is warm, dry and supple bilateral. Nails are minimally elongated thickened and dystrophic with subungual debris, consistent with onychomycosis, 1-5 bilateral. No signs of infection. No open lesions or preulcerative lesions present bilateral. Remaining integument unremarkable.  Vasculature:  Dorsalis Pedis pulse 1/4 bilateral. Posterior Tibial pulse  1/4 bilateral. Capillary fill time <3 sec 1-5 bilateral. Positive hair growth to the level of the digits.Temperature gradient within normal limits. Minimal varicosities present bilateral. No edema present bilateral.   Neurology:  Subjective pain to the bottoms of both feet related to neuritis, unchanged from prior.  Musculoskeletal:  Asymptomatic pedal hammertoe and pes planus deformities noted bilateral. Muscular strength 5/5 in all lower extremity muscular groups bilateral.  Assessment and Plan: Problem List Items Addressed This Visit   None Visit Diagnoses     Pain due to onychomycosis of toenails of both feet    -  Primary   Neuritis due to diabetes mellitus (HCC)       Pes planus of both feet       Hammer toe, unspecified laterality           -Examined patient. -Re-Discussed and educated patient on diabetic foot care -Mechanically debrided all nails 1-5 bilateral  using sterile nail nipper and filed with dremel without incident  -Answered all patient questions -Patient to return  in 3 months for at risk foot care and as scheduled for diabetic shoe measurements.  Landis Martins, DPM

## 2021-06-17 DIAGNOSIS — D649 Anemia, unspecified: Secondary | ICD-10-CM | POA: Diagnosis not present

## 2021-06-17 DIAGNOSIS — I8500 Esophageal varices without bleeding: Secondary | ICD-10-CM | POA: Diagnosis not present

## 2021-06-17 DIAGNOSIS — Z86010 Personal history of colonic polyps: Secondary | ICD-10-CM | POA: Diagnosis not present

## 2021-06-17 DIAGNOSIS — E538 Deficiency of other specified B group vitamins: Secondary | ICD-10-CM | POA: Diagnosis not present

## 2021-06-17 DIAGNOSIS — K649 Unspecified hemorrhoids: Secondary | ICD-10-CM | POA: Diagnosis not present

## 2021-06-17 DIAGNOSIS — K7460 Unspecified cirrhosis of liver: Secondary | ICD-10-CM | POA: Diagnosis not present

## 2021-06-17 DIAGNOSIS — K5790 Diverticulosis of intestine, part unspecified, without perforation or abscess without bleeding: Secondary | ICD-10-CM | POA: Diagnosis not present

## 2021-06-24 ENCOUNTER — Encounter: Payer: Self-pay | Admitting: Gastroenterology

## 2021-06-24 ENCOUNTER — Ambulatory Visit (INDEPENDENT_AMBULATORY_CARE_PROVIDER_SITE_OTHER): Payer: Medicare Other | Admitting: Gastroenterology

## 2021-06-24 VITALS — BP 150/80 | HR 58 | Ht 68.0 in | Wt 190.0 lb

## 2021-06-24 DIAGNOSIS — I8510 Secondary esophageal varices without bleeding: Secondary | ICD-10-CM | POA: Diagnosis not present

## 2021-06-24 DIAGNOSIS — K7460 Unspecified cirrhosis of liver: Secondary | ICD-10-CM | POA: Diagnosis not present

## 2021-06-24 DIAGNOSIS — I25118 Atherosclerotic heart disease of native coronary artery with other forms of angina pectoris: Secondary | ICD-10-CM

## 2021-06-24 DIAGNOSIS — D631 Anemia in chronic kidney disease: Secondary | ICD-10-CM

## 2021-06-24 DIAGNOSIS — N189 Chronic kidney disease, unspecified: Secondary | ICD-10-CM

## 2021-06-24 DIAGNOSIS — R188 Other ascites: Secondary | ICD-10-CM | POA: Diagnosis not present

## 2021-06-24 NOTE — Progress Notes (Signed)
[facility] Progress Note  Chief Complaint: Cryptogenic cirrhosis  Subjective  History: Last clinic visit 04/23/2021 after hospitalization.  Patient with cryptogenic cirrhosis with ascites that previously required paracentesis, now on diuretics.  Nonbleeding esophageal varices found on EGD during hospitalization this year, now on nonselective beta-blocker.  He has anemia from CKD, and at last visit was referred to nephrology for assistance monitoring and managing that as well as management of his diuretics.  I recently received and just reviewed an office consult note from [facility] dated 06/06/2021. Patient was felt to be euvolemic and not presently in need of diuretics.  Lab results below in 76-month follow-up arranged.  Matthew Ferguson was here with his wife.  He reports feeling generally well except for chronic aches and pains.  He has some occasional mild swelling of his right ankle, gets better if he elevates his feet.  He saw Dr. Lyda Jester last week, primarily to get his B12 injections, and says his pulse has been running low so his nadolol was cut from 40 mg daily to 20 mg daily.  ROS: Cardiovascular:  no chest pain Respiratory: no dyspnea  The patient's Past Medical, Family and Social History were reviewed and are on file in the EMR.  Objective:  Med list reviewed  Current Outpatient Medications:    acetaminophen (TYLENOL) 500 MG tablet, Take 500 mg by mouth as needed for mild pain., Disp: , Rfl:    fluticasone (FLONASE) 50 MCG/ACT nasal spray, Place 1 spray into both nostrils as needed for congestion., Disp: , Rfl:    hydrocortisone (ANUSOL-HC) 25 MG suppository, Place 1 suppository (25 mg total) rectally 2 (two) times daily as needed for hemorrhoids or anal itching. (Patient taking differently: Place 25 mg rectally as needed for hemorrhoids or anal itching.), Disp: 12 suppository, Rfl: 1   loperamide (IMODIUM) 2 MG capsule, Take 4 mg by mouth as needed  for diarrhea or loose stools., Disp: , Rfl:    nadolol (CORGARD) 20 MG tablet, Take 1 tablet by mouth daily., Disp: , Rfl:    nitroGLYCERIN (NITROSTAT) 0.4 MG SL tablet, DISSOLVE 1 TABLET UNDER THE TONGUE EVERY 5 MINUTES FOR UP TO 3 DOSES AS NEEDED FOR CHEST PAIN, Disp: 25 tablet, Rfl: 7   pantoprazole (PROTONIX) 40 MG tablet, Take 40 mg by mouth daily., Disp: , Rfl:    Vital signs in last 24 hrs: Vitals:   06/24/21 1536  BP: (!) 150/80  Pulse: (!) 58   Wt Readings from Last 3 Encounters:  06/24/21 190 lb (86.2 kg)  04/23/21 187 lb 6 oz (85 kg)  04/02/21 172 lb 6.4 oz (78.2 kg)  His weight is up a few pounds from last visit,, his appetite is returned in recent months and he has been eating more.  Physical Exam  Well-appearing.  Gets on exam table without assistance. HEENT: sclera anicteric, oral mucosa moist without lesions Neck: supple, no thyromegaly, JVD or lymphadenopathy Cardiac: RRR without murmurs, S1S2 heard, no peripheral edema Pulm: clear to auscultation bilaterally, normal RR and effort noted Abdomen: soft, no tenderness, with active bowel sounds.  No appreciable hepatosplenomegaly.  Mild bulging flanks, no shifting dullness or fluid wave Skin; warm and dry, no jaundice or rash  Labs:  06/06/2021 renal clinic labs Glucose 148, BUN 10, creatinine 1.68 Sodium 141, potassium 4.2, chloride 106, bicarb 29 Calcium 8.6, phosphorus 3.4, albumin 3.4  WBC 5.4, hemoglobin 10.8, hematocrit 31.7, MCV 96, platelet 120  Iron 57, TIBC 234 (24%  saturation) ferritin 49  ___________________________________________ Radiologic studies:   ____________________________________________ Other:   _____________________________________________ Assessment & Plan  Assessment: Encounter Diagnoses  Name Primary?   Cirrhosis of liver with ascites, unspecified hepatic cirrhosis type ([place]) Yes   Secondary esophageal varices without bleeding (HCC)    Anemia in chronic kidney disease,  unspecified CKD stage     Clinically stable cryptogenic cirrhosis, no hepatic encephalopathy, is on primary prophylaxis for esophageal varices, recent dose reduction of nadolol.  Previous volume overload, clinically stable and I do not think he has ascites on exam. Reinforced avoiding concentrated carbohydrates and the need for a 2000 mg daily sodium restriction.  Plan: No further testing at present. See nephrology as scheduled in 3 months. See me in 4 months.  Call sooner if needed, especially if he is developing increasing abdominal girth that may indicate ascites  32 minutes were spent on this encounter (including chart review, history/exam, counseling/coordination of care, and documentation) > 50% of that time was spent on counseling and coordination of care.  Topics discussed included: See above.  [name]

## 2021-06-24 NOTE — Patient Instructions (Signed)
If you are age 77 or older, your body mass index should be between 23-30. Your Body mass index is 28.89 kg/m. If this is out of the aforementioned range listed, please consider follow up with your Primary Care Provider.  If you are age 61 or younger, your body mass index should be between 19-25. Your Body mass index is 28.89 kg/m. If this is out of the aformentioned range listed, please consider follow up with your Primary Care Provider.   __________________________________________________________  The [facility] providers would like to encourage you to use [facility] to communicate with providers for non-urgent requests or questions.  Due to long hold times on the telephone, sending your provider a message by [facility] may be a faster and more efficient way to get a response.  Please allow 48 business hours for a response.  Please remember that this is for non-urgent requests.   It was a pleasure to see you today!  Thank you for trusting me with your gastrointestinal care!

## 2021-07-01 DIAGNOSIS — R042 Hemoptysis: Secondary | ICD-10-CM | POA: Diagnosis not present

## 2021-07-01 DIAGNOSIS — E1122 Type 2 diabetes mellitus with diabetic chronic kidney disease: Secondary | ICD-10-CM | POA: Diagnosis not present

## 2021-07-03 ENCOUNTER — Encounter: Payer: Self-pay | Admitting: Cardiology

## 2021-07-03 ENCOUNTER — Ambulatory Visit (INDEPENDENT_AMBULATORY_CARE_PROVIDER_SITE_OTHER): Payer: Medicare Other | Admitting: Cardiology

## 2021-07-03 ENCOUNTER — Other Ambulatory Visit: Payer: Self-pay

## 2021-07-03 VITALS — BP 138/72 | HR 56 | Ht 68.0 in | Wt 190.0 lb

## 2021-07-03 DIAGNOSIS — I10 Essential (primary) hypertension: Secondary | ICD-10-CM | POA: Diagnosis not present

## 2021-07-03 DIAGNOSIS — D649 Anemia, unspecified: Secondary | ICD-10-CM

## 2021-07-03 DIAGNOSIS — E782 Mixed hyperlipidemia: Secondary | ICD-10-CM | POA: Diagnosis not present

## 2021-07-03 DIAGNOSIS — I25118 Atherosclerotic heart disease of native coronary artery with other forms of angina pectoris: Secondary | ICD-10-CM | POA: Diagnosis not present

## 2021-07-03 MED ORDER — ISOSORBIDE MONONITRATE ER 30 MG PO TB24: 30.0000 mg | ORAL_TABLET | Freq: Every day | ORAL | 3 refills | Status: DC

## 2021-07-03 NOTE — Patient Instructions (Signed)
Medication Instructions:  Your physician has recommended you make the following change in your medication:  START: Imdur 30 mg take one tablet by mouth daily.  *If you need a refill on your cardiac medications before your next appointment, please call your pharmacy*   Lab Work: None If you have labs (blood work) drawn today and your tests are completely normal, you will receive your results only by: . MyChart Message (if you have MyChart) OR . A paper copy in the mail If you have any lab test that is abnormal or we need to change your treatment, we will call you to review the results.   Testing/Procedures: None   Follow-Up: At [facility], you and your health needs are our priority.  As part of our continuing mission to provide you with exceptional heart care, we have created designated Provider Care Teams.  These Care Teams include your primary Cardiologist (physician) and Advanced Practice Providers (APPs -  Physician Assistants and Nurse Practitioners) who all work together to provide you with the care you need, when you need it.  We recommend signing up for the patient portal called "MyChart".  Sign up information is provided on this After Visit Summary.  MyChart is used to connect with patients for Virtual Visits (Telemedicine).  Patients are able to view lab/test results, encounter notes, upcoming appointments, etc.  Non-urgent messages can be sent to your provider as well.   To learn more about what you can do with MyChart, go to [email or web address].    Your next appointment:   6 month(s)  The format for your next appointment:   In Person  Provider:   Brian Munley, MD   Other Instructions   

## 2021-07-03 NOTE — Progress Notes (Signed)
Cardiology Office Note:    Date:  07/03/2021   ID:  Matthew Ferguson, DOB Mar 19, 1946, MRN [id number]  PCP:  Garwin Brothers, MD  Cardiologist:  Shirlee More, MD    Referring MD: Garwin Brothers, MD    ASSESSMENT:    1. Coronary artery disease of native artery of native heart with stable angina pectoris ([place])   2. Symptomatic anemia   3. Essential hypertension   4. Mixed hyperlipidemia    PLAN:    In order of problems listed above:  He recently had a flare of angina after hospital discharge with severe symptomatic anemia he has stabilized I do not think he should be on antiplatelet therapy I advised him to take a low-dose of a low intensity statin a few days a week but he is hesitant does not want to more optimize his antianginal treatment adding oral nitrate to his beta-blocker.  I would not raise the issue of further revascularization. Improved stable following transfusion Stable blood pressure at target continue beta-blocker for multiple reasons cardiac blood pressure and hypertension Poorly controlled he declines lipid-lowering treatment worried about his liver disease   Next appointment: 6 months   Medication Adjustments/Labs and Tests Ordered: Current medicines are reviewed at length with the patient today.  Concerns regarding medicines are outlined above.  No orders of the defined types were placed in this encounter.  Meds ordered this encounter  Medications   isosorbide mononitrate (IMDUR) 30 MG 24 hr tablet    Sig: Take 1 tablet (30 mg total) by mouth daily.    Dispense:  90 tablet    Refill:  3    Chief Complaint  Patient presents with   Follow-up   Coronary Artery Disease    History of Present Illness:    Matthew Ferguson is a 75 y.o. male with a hx of CAD with PCI of the right coronary artery in 2013 hypertension hyperlipidemia type 2 diabetes mellitus and hepatitis C with cirrhosis with portal venous hypertension and ascites last seen 12/11/2020 with preoperative  evaluation.  She was admitted to [facility] in May with weight gain severe anemia hemoglobin 6.9 requiring transfusion associated with intermittent melena and bright red blood per rectum.  She received 2 additional units of [facility] and underwent evaluation including upper endoscopy and colonoscopy.  She had worsening renal function which improved in hospital hyperkalemia that resolved.  Endoscopy showed esophageal varices portal gastropathy and colonoscopy is normal except for internal hemorrhoids.  She was recently seen by GI 06/24/2021 with stable cirrhosis no findings of encephalopathy and improvement of volume overload.  Compliance with diet, lifestyle and medications: Yes  Most recent hemoglobin 8 months ago 9.9 platelets 111,000 serum sodium 134 potassium 4.0 stable creatinine 2.06 with GFR 33 cc EKG 03/29/2021 independently reviewed sinus rhythm old inferior infarction no acute ischemic ST changes  She is seen with her primary care physician service 05/28/2021 with complaints of chest pain.  Laboratory test showed a hemoglobin at that time 11.1 platelets 109,000 GFR 47 cc creatinine 1.50  He has slowly and steadily improved strength and endurance are back to normal no shortness of breath palpitation and no edema or ascites.  Following discharge from the hospital he had several episodes of chest pain relieved with nitroglycerin.  He obviously cannot take antiplatelet therapy and is not a candidate for further revascularization we discussed taking a statin for plaque stabilization he declined however we will optimize antianginal therapy adding an oral mononitrate.  He is  on a beta-blocker for cardiac and liver disease.  I asked him to sign up my chart and contact me if he continues to have episodes of chest pain requiring nitroglycerin Past Medical History:  Diagnosis Date   Ascites    Chronic kidney disease    Cirrhosis ([place])    Complication of anesthesia    Post operative  vomitting for two weeks    Coronary artery disease involving native coronary artery 05/29/2015   05/08/12 had PCI and Xience stent to RCA for Troponin normal unstable angina with residual 40-50% proximal and 30-40 % mid LAD stenosis, EF normal 55-60%   Essential hypertension 05/29/2015   GERD (gastroesophageal reflux disease)    Hepatitis-C 03/31/2021   undetectable virus on 03/31/2021.     Irritable bowel syndrome    Mixed hyperlipidemia 05/29/2015   Type 2 diabetes mellitus ([place]) 05/29/2015    Past Surgical History:  Procedure Laterality Date   BACK SURGERY     CARDIAC CATHETERIZATION     CHOLECYSTECTOMY     COLONOSCOPY WITH PROPOFOL N/A 04/02/2021   Procedure: COLONOSCOPY WITH PROPOFOL;  Surgeon: Irene Shipper, MD;  Location: [facility] ENDOSCOPY;  Service: Endoscopy;  Laterality: N/A;   CORONARY ANGIOPLASTY WITH STENT PLACEMENT     ESOPHAGOGASTRODUODENOSCOPY (EGD) WITH PROPOFOL N/A 04/01/2021   Procedure: ESOPHAGOGASTRODUODENOSCOPY (EGD) WITH PROPOFOL;  Surgeon: Doran Stabler, MD;  Location: [facility];  Service: Gastroenterology;  Laterality: N/A;   INGUINAL HERNIA REPAIR Bilateral 01/2021   KNEE SURGERY      Current Medications: Current Meds  Medication Sig   acetaminophen (TYLENOL) 500 MG tablet Take 500 mg by mouth as needed for mild pain.   fluticasone (FLONASE) 50 MCG/ACT nasal spray Place 1 spray into both nostrils as needed for congestion.   hydrocortisone (ANUSOL-HC) 25 MG suppository Place 1 suppository (25 mg total) rectally 2 (two) times daily as needed for hemorrhoids or anal itching. (Patient taking differently: Place 25 mg rectally as needed for hemorrhoids or anal itching.)   isosorbide mononitrate (IMDUR) 30 MG 24 hr tablet Take 1 tablet (30 mg total) by mouth daily.   loperamide (IMODIUM) 2 MG capsule Take 4 mg by mouth as needed for diarrhea or loose stools.   nadolol (CORGARD) 20 MG tablet Take 1 tablet by mouth daily.   nitroGLYCERIN (NITROSTAT) 0.4 MG SL tablet  DISSOLVE 1 TABLET UNDER THE TONGUE EVERY 5 MINUTES FOR UP TO 3 DOSES AS NEEDED FOR CHEST PAIN   pantoprazole (PROTONIX) 40 MG tablet Take 40 mg by mouth daily.     Allergies:   Anesthetics, amide and Lidocaine   Social History   Socioeconomic History   Marital status: Married    Spouse name: Pat   Number of children: 3   Years of education: Not on file   Highest education level: Not on file  Occupational History   Occupation: retired  Tobacco Use   Smoking status: Never   Smokeless tobacco: Never  Vaping Use   Vaping Use: Never used  Substance and Sexual Activity   Alcohol use: Never   Drug use: Never   Sexual activity: Not on file  Other Topics Concern   Not on file  Social History Narrative   Not on file   Social Determinants of Health   Financial Resource Strain: Not on file  Food Insecurity: Not on file  Transportation Needs: Not on file  Physical Activity: Not on file  Stress: Not on file  Social Connections: Not on file  Family History: The patient's family history includes Congenital heart disease in his daughter; Diabetes in his father; Heart disease in his father; Other in his mother; Stroke in his father. ROS:   Please see the history of present illness.    All other systems reviewed and are negative.  EKGs/Labs/Other Studies Reviewed:    The following studies were reviewed today:   Physical Exam:    VS:  BP 138/72 (BP Location: Left Arm, Patient Position: Sitting, Cuff Size: Normal)   Pulse (!) 56   Ht 5\' 8"  (1.727 m)   Wt 190 lb (86.2 kg)   SpO2 98%   BMI 28.89 kg/m     Wt Readings from Last 3 Encounters:  07/03/21 190 lb (86.2 kg)  06/24/21 190 lb (86.2 kg)  04/23/21 187 lb 6 oz (85 kg)     GEN: He is not icteric he does not look acutely or chronically ill no evidence of encephalopathy well nourished, well developed in no acute distress HEENT: Normal NECK: No JVD; No carotid bruits LYMPHATICS: No lymphadenopathy CARDIAC: RRR, no  murmurs, rubs, gallops RESPIRATORY:  Clear to auscultation without rales, wheezing or rhonchi  ABDOMEN: Soft, non-tender, non-distended MUSCULOSKELETAL:  No edema; No deformity  SKIN: Warm and dry NEUROLOGIC:  Alert and oriented x 3 PSYCHIATRIC:  Normal affect    Signed, Shirlee More, MD  07/03/2021 3:21 PM    [facility]

## 2021-07-06 DIAGNOSIS — K7689 Other specified diseases of liver: Secondary | ICD-10-CM | POA: Diagnosis not present

## 2021-07-06 DIAGNOSIS — R042 Hemoptysis: Secondary | ICD-10-CM | POA: Diagnosis not present

## 2021-07-06 DIAGNOSIS — K7460 Unspecified cirrhosis of liver: Secondary | ICD-10-CM | POA: Diagnosis not present

## 2021-07-06 DIAGNOSIS — R188 Other ascites: Secondary | ICD-10-CM | POA: Diagnosis not present

## 2021-07-12 DIAGNOSIS — Z23 Encounter for immunization: Secondary | ICD-10-CM | POA: Diagnosis not present

## 2021-07-16 DIAGNOSIS — E538 Deficiency of other specified B group vitamins: Secondary | ICD-10-CM | POA: Diagnosis not present

## 2021-07-31 ENCOUNTER — Telehealth: Payer: Self-pay | Admitting: Gastroenterology

## 2021-07-31 ENCOUNTER — Telehealth: Payer: Self-pay | Admitting: Cardiology

## 2021-07-31 NOTE — Telephone Encounter (Signed)
Patient said he is applying for disability from the [place]. He needs Dr. Bettina Gavia to write him a letter for his disability application that states his Hypertension is related to his time in [place].  Please let the patient know what Dr. Bettina Gavia can do to help.

## 2021-07-31 NOTE — Telephone Encounter (Signed)
Patient called requesting a letter to be able to apply for disability trough the VA please call to discuss further.

## 2021-07-31 NOTE — Telephone Encounter (Signed)
Attempted to reach patient twice, his vm is full and he is not able to accept any new messages at this time. Unable to leave a message.

## 2021-08-01 NOTE — Telephone Encounter (Signed)
Letter printed and patient is going to come pick it up today.    Encouraged patient to call back with any questions or concerns.

## 2021-08-05 NOTE — Telephone Encounter (Signed)
Attempted to reach patient today, his vm is full and cannot accept any new messages at this time.

## 2021-08-08 NOTE — Telephone Encounter (Addendum)
I am unable to make such a determination from the records I have on this patient, and therefore unable to provide such a letter.  This patient previously reported that Dr. Dennison Nancy in [place] told him he had Hepatitis C, but the patient recalled no treatment for that.  Matthew Ferguson certainly does not have chronic Hepatitis C at this time, with a negative PCR blood test in May of this year.  I suggest they pose this question to Dr. Lyda Jester, but I do not know if he will be able to answer it either.  - HD

## 2021-08-08 NOTE — Telephone Encounter (Signed)
Pt returned call. He states that he has been working with the [employer] in [place]. He reports that his contact person is Townsend Roger at [phone]. He states that she told him he needed a letter from his doctors, pot did not know what the letter needed to say. Advised that I will reach out to Fernando Salinas and will keep him updated.  I spoke with [name], she will give me a call back when she gets back to her desk and is able to review the patient's information. Will await return call.

## 2021-08-08 NOTE — Telephone Encounter (Signed)
Attempted to reach patient twice, his vm is full and cannot accept any new messages at this time. Will try patient again at a later time.

## 2021-08-08 NOTE — Telephone Encounter (Signed)
[name] returned the call. She states that she has reviewed the patient's chart and he reported to them that he had a Hepatitis C exposure when he served in [place]. Kimberly states that she needs the provider to review patient's records and compose a letter that states "It likely is not his Hepatitis C exposure that has caused cirrhosis". She states that the department of veteran affairs is very specific on the verbiage that needs to be in the letter. Please advise, thanks.

## 2021-08-09 NOTE — Telephone Encounter (Signed)
Attempted to reach patient, his phone goes straight to vm. His vm is full and cannot accept any messages at this time. Unable to leave a vm.

## 2021-08-12 NOTE — Telephone Encounter (Signed)
4th attempt to reach patient. His phone goes straight to vm. His vm is full and cannot accept any new messages at this time.

## 2021-08-13 NOTE — Telephone Encounter (Signed)
No return call received. Will await further communication from patient.  

## 2021-09-12 DIAGNOSIS — E538 Deficiency of other specified B group vitamins: Secondary | ICD-10-CM | POA: Diagnosis not present

## 2021-09-13 ENCOUNTER — Other Ambulatory Visit: Payer: Self-pay

## 2021-09-13 ENCOUNTER — Ambulatory Visit (INDEPENDENT_AMBULATORY_CARE_PROVIDER_SITE_OTHER): Payer: Medicare Other | Admitting: Sports Medicine

## 2021-09-13 ENCOUNTER — Encounter: Payer: Self-pay | Admitting: Sports Medicine

## 2021-09-13 DIAGNOSIS — E1141 Type 2 diabetes mellitus with diabetic mononeuropathy: Secondary | ICD-10-CM | POA: Diagnosis not present

## 2021-09-13 DIAGNOSIS — M79674 Pain in right toe(s): Secondary | ICD-10-CM

## 2021-09-13 DIAGNOSIS — B351 Tinea unguium: Secondary | ICD-10-CM | POA: Diagnosis not present

## 2021-09-13 DIAGNOSIS — M2141 Flat foot [pes planus] (acquired), right foot: Secondary | ICD-10-CM

## 2021-09-13 DIAGNOSIS — M2142 Flat foot [pes planus] (acquired), left foot: Secondary | ICD-10-CM

## 2021-09-13 DIAGNOSIS — M2040 Other hammer toe(s) (acquired), unspecified foot: Secondary | ICD-10-CM

## 2021-09-13 DIAGNOSIS — M79675 Pain in left toe(s): Secondary | ICD-10-CM

## 2021-09-13 NOTE — Progress Notes (Signed)
Subjective: Matthew Ferguson is a 75 y.o. male patient with history of diabetes who presents to office today complaining of long,mildly painful nails  while ambulating in shoes; unable to trim.  Patient reports that he still has not gotten a phone call about getting measured for the diabetic shoes.  Patient also reports that he was sick the whole month of October has been struggling with long-term effects of exposure to agent orange and is currently applying for VA benefits.  Patient denies any other concerns at this time.  Patient Active Problem List   Diagnosis Date Noted   Rectal bleeding    Diverticulosis of colon without hemorrhage    Acute blood loss anemia 04/01/2021   Symptomatic anemia 03/31/2021   Hepatitis-C 03/31/2021   Pre-op evaluation 09/09/2019   Coronary artery disease involving native coronary artery 05/29/2015   Essential hypertension 05/29/2015   Mixed hyperlipidemia 05/29/2015   Type 2 diabetes mellitus ([place]) 05/29/2015   Current Outpatient Medications on File Prior to Visit  Medication Sig Dispense Refill   acetaminophen (TYLENOL) 500 MG tablet Take 500 mg by mouth as needed for mild pain.     fluticasone (FLONASE) 50 MCG/ACT nasal spray Place 1 spray into both nostrils as needed for congestion.     hydrocortisone (ANUSOL-HC) 25 MG suppository Place 1 suppository (25 mg total) rectally 2 (two) times daily as needed for hemorrhoids or anal itching. (Patient taking differently: Place 25 mg rectally as needed for hemorrhoids or anal itching.) 12 suppository 1   isosorbide mononitrate (IMDUR) 30 MG 24 hr tablet Take 1 tablet (30 mg total) by mouth daily. 90 tablet 3   loperamide (IMODIUM) 2 MG capsule Take 4 mg by mouth as needed for diarrhea or loose stools.     nadolol (CORGARD) 20 MG tablet Take 1 tablet by mouth daily.     nitroGLYCERIN (NITROSTAT) 0.4 MG SL tablet DISSOLVE 1 TABLET UNDER THE TONGUE EVERY 5 MINUTES FOR UP TO 3 DOSES AS NEEDED FOR CHEST PAIN 25 tablet 7    pantoprazole (PROTONIX) 40 MG tablet Take 40 mg by mouth daily.     No current facility-administered medications on file prior to visit.   Allergies  Allergen Reactions   Anesthetics, Amide Other (See Comments)    UNKNOWN   Lidocaine Other (See Comments)    UNKNOWN      Objective: General: Patient is awake, alert, and oriented x 3 and in no acute distress.  Integument: Skin is warm, dry and supple bilateral. Nails are minimally elongated thickened and dystrophic with subungual debris, consistent with onychomycosis, 1-5 bilateral. No signs of infection. No open lesions or preulcerative lesions present bilateral. Remaining integument unremarkable.  Vasculature:  Dorsalis Pedis pulse 1/4 bilateral. Posterior Tibial pulse  1/4 bilateral. Capillary fill time <3 sec 1-5 bilateral. Positive hair growth to the level of the digits.Temperature gradient within normal limits. Minimal varicosities present bilateral. No edema present bilateral.   Neurology:  Subjective pain to the bottoms of both feet related to neuritis, unchanged from prior.  Musculoskeletal:  Asymptomatic pedal hammertoe and pes planus deformities noted bilateral. Muscular strength 5/5 in all lower extremity muscular groups bilateral.  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 -Re-Discussed and educated patient on diabetic foot care -Mechanically debrided all nails 1-5 bilateral using sterile nail nipper and filed with dremel without incident  -Patient to be scheduled for diabetic shoe measurements -Answered all patient questions -Patient to return  in 3 months for at risk foot care and as scheduled for diabetic shoe measurements.  Landis Martins, DPM

## 2021-09-16 DIAGNOSIS — I129 Hypertensive chronic kidney disease with stage 1 through stage 4 chronic kidney disease, or unspecified chronic kidney disease: Secondary | ICD-10-CM | POA: Diagnosis not present

## 2021-09-16 DIAGNOSIS — D631 Anemia in chronic kidney disease: Secondary | ICD-10-CM | POA: Diagnosis not present

## 2021-09-16 DIAGNOSIS — N189 Chronic kidney disease, unspecified: Secondary | ICD-10-CM | POA: Diagnosis not present

## 2021-09-16 DIAGNOSIS — E1122 Type 2 diabetes mellitus with diabetic chronic kidney disease: Secondary | ICD-10-CM | POA: Diagnosis not present

## 2021-09-16 DIAGNOSIS — N1832 Chronic kidney disease, stage 3b: Secondary | ICD-10-CM | POA: Diagnosis not present

## 2021-09-16 DIAGNOSIS — N2581 Secondary hyperparathyroidism of renal origin: Secondary | ICD-10-CM | POA: Diagnosis not present

## 2021-09-16 DIAGNOSIS — K7460 Unspecified cirrhosis of liver: Secondary | ICD-10-CM | POA: Diagnosis not present

## 2021-10-02 DIAGNOSIS — R112 Nausea with vomiting, unspecified: Secondary | ICD-10-CM | POA: Diagnosis not present

## 2021-10-02 DIAGNOSIS — I25708 Atherosclerosis of coronary artery bypass graft(s), unspecified, with other forms of angina pectoris: Secondary | ICD-10-CM | POA: Diagnosis not present

## 2021-10-02 DIAGNOSIS — N1832 Chronic kidney disease, stage 3b: Secondary | ICD-10-CM | POA: Diagnosis not present

## 2021-10-02 DIAGNOSIS — F321 Major depressive disorder, single episode, moderate: Secondary | ICD-10-CM | POA: Diagnosis not present

## 2021-10-02 DIAGNOSIS — D649 Anemia, unspecified: Secondary | ICD-10-CM | POA: Diagnosis not present

## 2021-10-07 DIAGNOSIS — H25813 Combined forms of age-related cataract, bilateral: Secondary | ICD-10-CM | POA: Diagnosis not present

## 2021-10-07 DIAGNOSIS — H353131 Nonexudative age-related macular degeneration, bilateral, early dry stage: Secondary | ICD-10-CM | POA: Diagnosis not present

## 2021-10-15 ENCOUNTER — Ambulatory Visit (INDEPENDENT_AMBULATORY_CARE_PROVIDER_SITE_OTHER): Payer: Medicare Other | Admitting: Gastroenterology

## 2021-10-15 ENCOUNTER — Encounter: Payer: Self-pay | Admitting: Gastroenterology

## 2021-10-15 ENCOUNTER — Other Ambulatory Visit (INDEPENDENT_AMBULATORY_CARE_PROVIDER_SITE_OTHER): Payer: Medicare Other

## 2021-10-15 VITALS — BP 124/66 | HR 50 | Ht 68.0 in | Wt 193.5 lb

## 2021-10-15 DIAGNOSIS — N189 Chronic kidney disease, unspecified: Secondary | ICD-10-CM | POA: Diagnosis not present

## 2021-10-15 DIAGNOSIS — I8510 Secondary esophageal varices without bleeding: Secondary | ICD-10-CM | POA: Diagnosis not present

## 2021-10-15 DIAGNOSIS — D631 Anemia in chronic kidney disease: Secondary | ICD-10-CM

## 2021-10-15 DIAGNOSIS — R188 Other ascites: Secondary | ICD-10-CM

## 2021-10-15 DIAGNOSIS — I25118 Atherosclerotic heart disease of native coronary artery with other forms of angina pectoris: Secondary | ICD-10-CM | POA: Diagnosis not present

## 2021-10-15 DIAGNOSIS — K7460 Unspecified cirrhosis of liver: Secondary | ICD-10-CM

## 2021-10-15 LAB — COMPREHENSIVE METABOLIC PANEL
ALT: 8 U/L (ref 0–53)
AST: 17 U/L (ref 0–37)
Albumin: 3.2 g/dL — ABNORMAL LOW (ref 3.5–5.2)
Alkaline Phosphatase: 92 U/L (ref 39–117)
BUN: 12 mg/dL (ref 6–23)
CO2: 26 mEq/L (ref 19–32)
Calcium: 8.7 mg/dL (ref 8.4–10.5)
Chloride: 105 mEq/L (ref 96–112)
Creatinine, Ser: 1.6 mg/dL — ABNORMAL HIGH (ref 0.40–1.50)
GFR: 41.4 mL/min — ABNORMAL LOW (ref >60.00)
Glucose, Bld: 131 mg/dL — ABNORMAL HIGH (ref 70–99)
Potassium: 3.8 mEq/L (ref 3.5–5.1)
Sodium: 136 mEq/L (ref 135–145)
Total Bilirubin: 1.5 mg/dL — ABNORMAL HIGH (ref 0.2–1.2)
Total Protein: 6.6 g/dL (ref 6.0–8.3)

## 2021-10-15 LAB — PROTIME-INR
INR: 1.4 ratio — ABNORMAL HIGH (ref 0.8–1.0)
Prothrombin Time: 14.9 s — ABNORMAL HIGH (ref 9.6–13.1)

## 2021-10-15 LAB — CBC WITH DIFFERENTIAL/PLATELET
Basophils Absolute: 0 10*3/uL (ref 0.0–0.1)
Basophils Relative: 0.6 % (ref 0.0–3.0)
Eosinophils Absolute: 0.2 10*3/uL (ref 0.0–0.7)
Eosinophils Relative: 3.4 % (ref 0.0–5.0)
HCT: 34 % — ABNORMAL LOW (ref 39.0–52.0)
Hemoglobin: 11.3 g/dL — ABNORMAL LOW (ref 13.0–17.0)
Lymphocytes Relative: 23.7 % (ref 12.0–46.0)
Lymphs Abs: 1.3 10*3/uL (ref 0.7–4.0)
MCHC: 33.3 g/dL (ref 30.0–36.0)
MCV: 103.2 fl — ABNORMAL HIGH (ref 78.0–100.0)
Monocytes Absolute: 0.8 10*3/uL (ref 0.1–1.0)
Monocytes Relative: 15.1 % — ABNORMAL HIGH (ref 3.0–12.0)
Neutro Abs: 3.2 10*3/uL (ref 1.4–7.7)
Neutrophils Relative %: 57.2 % (ref 43.0–77.0)
Platelets: 102 10*3/uL — ABNORMAL LOW (ref 150.0–400.0)
RBC: 3.29 Mil/uL — ABNORMAL LOW (ref 4.22–5.81)
RDW: 14.1 % (ref 11.5–15.5)
WBC: 5.5 10*3/uL (ref 4.0–10.5)

## 2021-10-15 NOTE — Patient Instructions (Signed)
If you are age 77 or older, your body mass index should be between 23-30. Your Body mass index is 29.42 kg/m. If this is out of the aforementioned range listed, please consider follow up with your Primary Care Provider.  If you are age 25 or younger, your body mass index should be between 19-25. Your Body mass index is 29.42 kg/m. If this is out of the aformentioned range listed, please consider follow up with your Primary Care Provider.   ________________________________________________________  The [facility] providers would like to encourage you to use [facility] to communicate with providers for non-urgent requests or questions.  Due to long hold times on the telephone, sending your provider a message by [facility] may be a faster and more efficient way to get a response.  Please allow 48 business hours for a response.  Please remember that this is for non-urgent requests.  _______________________________________________________  Your provider has requested that you go to the basement level for lab work before leaving today. Press "B" on the elevator. The lab is located at the first door on the left as you exit the elevator.  You have been scheduled for an abdominal paracentesis at [facility]  radiology (1st floor of hospital) on 10-17-2021 at 10am. Please arrive at least 15 minutes prior to your appointment time for registration. Should you need to reschedule this appointment for any reason, please call our office at [phone].   Due to recent changes in healthcare laws, you may see the results of your imaging and laboratory studies on MyChart before your provider has had a chance to review them.  We understand that in some cases there may be results that are confusing or concerning to you. Not all laboratory results come back in the same time frame and the provider may be waiting for multiple results in order to interpret others.  Please give [place] 48 hours in order for your provider to thoroughly  review all the results before contacting the office for clarification of your results.    It was a pleasure to see you today!  Thank you for trusting me with your gastrointestinal care!

## 2021-10-15 NOTE — Progress Notes (Signed)
[facility] Progress Note  Chief Complaint: Cryptogenic cirrhosis  Subjective  History: From my 06/24/2021 office note assessment and plan: "Clinically stable cryptogenic cirrhosis, no hepatic encephalopathy, is on primary prophylaxis for esophageal varices, recent dose reduction of nadolol.  Previous volume overload, clinically stable and I do not think he has ascites on exam. Reinforced avoiding concentrated carbohydrates and the need for a 2000 mg daily sodium restriction.   Plan: No further testing at present. See nephrology as scheduled in 3 months. See me in 4 months.  Call sooner if needed, especially if he is developing increasing abdominal girth that may indicate ascites" _____________  Matthew Ferguson is with his wife today and sees me for follow-up of his cirrhosis.  He has had significant weight gain the last [redacted] weeks along with abdominal distention and peripheral edema.  He has been off diuretics for about the last 6 months as noted above. He denies fever or abdominal pain, other than the distention from the ascites. ROS: Cardiovascular:  no chest pain Respiratory: The ascites is making him short of breath laying down. Remainder systems negative except as noted above The patient's Past Medical, Family and Social History were reviewed and are on file in the EMR.  Objective:  Med list reviewed  Current Outpatient Medications:    acetaminophen (TYLENOL) 500 MG tablet, Take 500 mg by mouth as needed for mild pain., Disp: , Rfl:    fluticasone (FLONASE) 50 MCG/ACT nasal spray, Place 1 spray into both nostrils as needed for congestion., Disp: , Rfl:    hydrocortisone (ANUSOL-HC) 25 MG suppository, Place 1 suppository (25 mg total) rectally 2 (two) times daily as needed for hemorrhoids or anal itching. (Patient taking differently: Place 25 mg rectally as needed for hemorrhoids or anal itching.), Disp: 12 suppository, Rfl: 1   isosorbide mononitrate (IMDUR) 30 MG 24 hr tablet,  Take 1 tablet (30 mg total) by mouth daily., Disp: 90 tablet, Rfl: 3   loperamide (IMODIUM) 2 MG capsule, Take 4 mg by mouth as needed for diarrhea or loose stools., Disp: , Rfl:    nadolol (CORGARD) 20 MG tablet, Take 1 tablet by mouth daily., Disp: , Rfl:    nitroGLYCERIN (NITROSTAT) 0.4 MG SL tablet, DISSOLVE 1 TABLET UNDER THE TONGUE EVERY 5 MINUTES FOR UP TO 3 DOSES AS NEEDED FOR CHEST PAIN, Disp: 25 tablet, Rfl: 7   pantoprazole (PROTONIX) 40 MG tablet, Take 40 mg by mouth daily., Disp: , Rfl:    sertraline (ZOLOFT) 25 MG tablet, Take 25 mg by mouth daily., Disp: , Rfl:    Vital signs in last 24 hrs: Vitals:   10/15/21 1406  BP: 124/66  Pulse: (!) 50  SpO2: 97%   Wt Readings from Last 3 Encounters:  10/15/21 193 lb 8 oz (87.8 kg)  07/03/21 190 lb (86.2 kg)  06/24/21 190 lb (86.2 kg)    Physical Exam  Thin as before HEENT: sclera anicteric, oral mucosa moist without lesions Neck: supple, no thyromegaly, JVD or lymphadenopathy Cardiac: RRR without murmurs, S1S2 heard, pitting edema to knee on the left, none on the right (he says that is typical for him when he had edema in the past) Pulm: clear to auscultation bilaterally, normal RR and effort noted Abdomen: soft, markedly distended with ascites, not tense, no tenderness, with active bowel sounds. No guarding or palpable hepatosplenomegaly. Skin; warm and dry, no jaundice or rash  Labs:  Nothing recent ___________________________________________ Radiologic studies:   ____________________________________________ Other:   _____________________________________________  Assessment & Plan  Assessment: Encounter Diagnoses  Name Primary?   Cirrhosis of liver with ascites, unspecified hepatic cirrhosis type ([place]) Yes   Secondary esophageal varices without bleeding (HCC)    Anemia in chronic kidney disease, unspecified CKD stage    Other ascites    End-stage liver disease/cirrhosis of unknown cause. Nonbleeding  esophageal varices from cirrhotic portal hypertension Portal hypertensive ascites, slowly recurred and now needs paracentesis for relief of distention and dyspnea and then resumption of diuretics.  Will need his nephrologist assistance managing this due to his underlying CKD.  2 g daily sodium restriction advised.  We were able to get him set up for a paracentesis the day after tomorrow.  Maximum 5 L removal, 25 g IV albumin given at that time.  Fluid sent for cell count  I will copy this note to his nephrologist, Dr. Candiss Norse, and ask him to see Matthew Ferguson soon for follow-up of his volume overload and diuretic management.  Labs today: CBC, CMP, AFP  Full abdominal ultrasound including Doppler studies to check portal vein flow and screen for hepatoma at the same time he has ultrasound-guided paracentesis  Follow-up with me 6 weeks.  83minutes were spent on this encounter (including chart review, history/exam, counseling/coordination of care, and documentation) > 50% of that time was spent on counseling and coordination of care.   Matthew Ferguson

## 2021-10-16 DIAGNOSIS — K219 Gastro-esophageal reflux disease without esophagitis: Secondary | ICD-10-CM | POA: Diagnosis not present

## 2021-10-16 DIAGNOSIS — K7469 Other cirrhosis of liver: Secondary | ICD-10-CM | POA: Diagnosis not present

## 2021-10-16 DIAGNOSIS — R188 Other ascites: Secondary | ICD-10-CM | POA: Diagnosis not present

## 2021-10-16 DIAGNOSIS — I8500 Esophageal varices without bleeding: Secondary | ICD-10-CM | POA: Diagnosis not present

## 2021-10-16 DIAGNOSIS — D696 Thrombocytopenia, unspecified: Secondary | ICD-10-CM | POA: Diagnosis not present

## 2021-10-16 DIAGNOSIS — E538 Deficiency of other specified B group vitamins: Secondary | ICD-10-CM | POA: Diagnosis not present

## 2021-10-16 LAB — AFP TUMOR MARKER: AFP-Tumor Marker: 1.5 ng/mL (ref <6.1)

## 2021-10-17 ENCOUNTER — Other Ambulatory Visit: Payer: Self-pay

## 2021-10-17 ENCOUNTER — Ambulatory Visit (HOSPITAL_COMMUNITY)
Admission: RE | Admit: 2021-10-17 | Discharge: 2021-10-17 | Disposition: A | Discharge status: Home / Self Care | Payer: Medicare Other | Source: Ambulatory Visit | Attending: Gastroenterology | Admitting: Gastroenterology

## 2021-10-17 DIAGNOSIS — D631 Anemia in chronic kidney disease: Secondary | ICD-10-CM | POA: Diagnosis not present

## 2021-10-17 DIAGNOSIS — R188 Other ascites: Secondary | ICD-10-CM

## 2021-10-17 DIAGNOSIS — K7460 Unspecified cirrhosis of liver: Secondary | ICD-10-CM | POA: Insufficient documentation

## 2021-10-17 DIAGNOSIS — I8510 Secondary esophageal varices without bleeding: Secondary | ICD-10-CM | POA: Diagnosis not present

## 2021-10-17 DIAGNOSIS — N189 Chronic kidney disease, unspecified: Secondary | ICD-10-CM | POA: Diagnosis not present

## 2021-10-17 HISTORY — PX: IR PARACENTESIS: IMG2679

## 2021-10-17 LAB — BODY FLUID CELL COUNT WITH DIFFERENTIAL
Eos, Fluid: 0 %
Lymphs, Fluid: 34 %
Monocyte-Macrophage-Serous Fluid: 62 % (ref 50–90)
Neutrophil Count, Fluid: 4 % (ref 0–25)
Total Nucleated Cell Count, Fluid: 73 cu mm (ref 0–1000)

## 2021-10-17 MED ORDER — TETRACAINE HCL 1 % IJ SOLN
90.0000 mg | Freq: Once | INTRAMUSCULAR | Status: DC
  Filled 2021-10-17: qty 10

## 2021-10-17 MED ORDER — TETRACAINE HCL 1 % IJ SOLN
10.0000 mg | Freq: Once | INTRAMUSCULAR | Status: DC
  Filled 2021-10-17: qty 2

## 2021-10-17 MED ORDER — ALBUMIN HUMAN 25 % IV SOLN
INTRAVENOUS | Status: AC
  Administered 2021-10-17: 50 g via INTRAVENOUS
  Filled 2021-10-17: qty 200

## 2021-10-17 MED ORDER — ALBUMIN HUMAN 25 % IV SOLN
50.0000 g | Freq: Once | INTRAVENOUS | Status: AC
  Filled 2021-10-17: qty 200

## 2021-10-17 NOTE — Procedures (Signed)
PROCEDURE SUMMARY:  Successful image-guided paracentesis from the right lower abdomen.  Yielded 5.3 liters of clear yellow fluid.  No immediate complications.  EBL < 1 mL Patient tolerated well.   Specimen was sent for labs.  Please see imaging section of Epic for full dictation.  Joaquim Nam PA-C 10/17/2021 11:18 AM

## 2021-10-18 LAB — PATHOLOGIST SMEAR REVIEW

## 2021-10-21 ENCOUNTER — Other Ambulatory Visit: Payer: Self-pay

## 2021-10-21 MED ORDER — SPIRONOLACTONE 100 MG PO TABS: 50.0000 mg | ORAL_TABLET | Freq: Every day | ORAL | 1 refills | Status: DC

## 2021-10-21 MED ORDER — FUROSEMIDE 40 MG PO TABS: 20.0000 mg | ORAL_TABLET | Freq: Every day | ORAL | 1 refills | Status: DC

## 2021-10-21 MED ORDER — FUROSEMIDE 40 MG PO TABS
20.0000 mg | ORAL_TABLET | Freq: Every day | ORAL | 1 refills | Status: DC
Start: 2021-10-21 — End: 2021-10-21

## 2021-10-21 NOTE — Progress Notes (Signed)
Pt returned call, he stated that he only had an RX for Spironolactone at home. Advised that I will send both prescriptions to local pharmacy on file.

## 2021-10-21 NOTE — Addendum Note (Signed)
Addended by: Yevette Edwards on: 10/21/2021 01:02 PM   Modules accepted: Orders

## 2021-10-23 DIAGNOSIS — N1832 Chronic kidney disease, stage 3b: Secondary | ICD-10-CM | POA: Diagnosis not present

## 2021-10-23 DIAGNOSIS — N2581 Secondary hyperparathyroidism of renal origin: Secondary | ICD-10-CM | POA: Diagnosis not present

## 2021-10-23 DIAGNOSIS — K7460 Unspecified cirrhosis of liver: Secondary | ICD-10-CM | POA: Diagnosis not present

## 2021-10-23 DIAGNOSIS — I129 Hypertensive chronic kidney disease with stage 1 through stage 4 chronic kidney disease, or unspecified chronic kidney disease: Secondary | ICD-10-CM | POA: Diagnosis not present

## 2021-10-23 DIAGNOSIS — D631 Anemia in chronic kidney disease: Secondary | ICD-10-CM | POA: Diagnosis not present

## 2021-10-23 DIAGNOSIS — E1122 Type 2 diabetes mellitus with diabetic chronic kidney disease: Secondary | ICD-10-CM | POA: Diagnosis not present

## 2021-10-24 ENCOUNTER — Telehealth: Payer: Self-pay | Admitting: Gastroenterology

## 2021-10-24 NOTE — Telephone Encounter (Signed)
Patient recently called requesting refills.  He needs them called into [facility] on [address] in [place].

## 2021-10-24 NOTE — Telephone Encounter (Signed)
Duplicate, see alternate telephone note.

## 2021-10-24 NOTE — Telephone Encounter (Signed)
Pt called back into the office and stated that [facility] id not have Lasix either. I called [facility] and spoke with Chloe. Chloe states that they do have Lasix in stock, insurance is kicking back the prescription because [facility] billed it already ([facility] reported that [facility]'t [facility] the medication in stock). Chloe states that they can use a discount card for the patient and he can pick up his Lasix today for $7.49 and Aldactone $18.99 if that was unavailable through [facility] also. I called the patient back and gave him this information. He states that the Aldactone was shipped but he hasn't checked the mail today. He states that he will go get Lasix from [facility]. Pt verbalized understanding and had no concerns at the end of the call.

## 2021-10-24 NOTE — Telephone Encounter (Signed)
Patient called stated that pharmacy called and they are out of Lasix and needs alternative. Please advise.

## 2021-10-24 NOTE — Telephone Encounter (Signed)
"  Patient recently called requesting refills.  He needs them called into [facility] on [address] [place] and spoke [place]. He states that [facility] not have any [facility]. Advised that there is not an alternative and that we can send it to a different pharmacy. Advised pt that I sent both diuretics to his local pharmacy on Monday. Pt has not called them and checked on the prescriptions. Advised that he call [facility] to see if prescriptions are available. Pt states that [facility] because he doesn't have to pay and at [facility] he will. Advised that we sent it to the local pharmacy so that he could go ahead and get started on the medications. Pt states that he will call [facility]. Pt had no concerns at the end of the call.

## 2021-11-05 ENCOUNTER — Telehealth: Payer: Self-pay | Admitting: Cardiology

## 2021-11-05 NOTE — Telephone Encounter (Signed)
Tried calling patient. No answer and no voicemail set up for me to leave a message. 

## 2021-11-05 NOTE — Telephone Encounter (Signed)
Called and spoke with patient. He states that he has seen a great improvement in the ascites. He has been on Furosemide 20 mg daily (half of 40 mg tablet) and Spironolactone 50 mg daily (half of 100 mg tablet) for about 2 weeks. He states that he is back down to 173 lbs today. He states that he has less abdominal pressure as well. Pt wanted to know if you want him to stay on this dose, if so he has requested a 90 day supply of each medication be sent to [facility]. He also mentioned that he requested that you write a letter about his condition to the [place], he is applying for disability.

## 2021-11-05 NOTE — Telephone Encounter (Signed)
Thank you for the update.  173 pounds sounds likely at or near his usual weight before he had return of ascites.  If he and his wife agree that is the case, and if he has had significant decrease in the abdominal girth, then we will continue these doses of furosemide and spironolactone.  Please send a 90-day prescription to his preferred pharmacy, and I would like to see him in clinic in 2 months. Encouraged him to contact us sooner if he has significant worsening of the ascites such as when I had seen him in clinic recently and if he thinks a paracentesis may be needed.  - HD

## 2021-11-05 NOTE — Telephone Encounter (Signed)
I see from the recent office note at [facility] on 10/23/2021 as well as subsequent calls to our office of this patient had not started his furosemide, awaiting its delivery or availability from a mail order pharmacy.  From what I can read, his prescriptions were then sent to a local pharmacy.  Can you please check to make sure that he is taking both his furosemide and spironolactone and check to be sure which doses he is taking to make sure it is accurate.  Also, ask if it has helped improve the ascites or if he feels it is similar to when I saw him in the office and might need another paracentesis.  - HD

## 2021-11-05 NOTE — Telephone Encounter (Signed)
Patient states he was returning call. Please advise ?

## 2021-11-06 ENCOUNTER — Encounter: Payer: Self-pay | Admitting: Gastroenterology

## 2021-11-06 DIAGNOSIS — E1169 Type 2 diabetes mellitus with other specified complication: Secondary | ICD-10-CM | POA: Diagnosis not present

## 2021-11-06 DIAGNOSIS — E782 Mixed hyperlipidemia: Secondary | ICD-10-CM | POA: Diagnosis not present

## 2021-11-06 DIAGNOSIS — F321 Major depressive disorder, single episode, moderate: Secondary | ICD-10-CM | POA: Diagnosis not present

## 2021-11-06 DIAGNOSIS — N1832 Chronic kidney disease, stage 3b: Secondary | ICD-10-CM | POA: Diagnosis not present

## 2021-11-06 DIAGNOSIS — I25708 Atherosclerosis of coronary artery bypass graft(s), unspecified, with other forms of angina pectoris: Secondary | ICD-10-CM | POA: Diagnosis not present

## 2021-11-06 MED ORDER — FUROSEMIDE 40 MG PO TABS: 20.0000 mg | ORAL_TABLET | Freq: Every day | ORAL | 1 refills | Status: DC

## 2021-11-06 MED ORDER — SPIRONOLACTONE 100 MG PO TABS: 50.0000 mg | ORAL_TABLET | Freq: Every day | ORAL | 1 refills | Status: DC

## 2021-11-06 NOTE — Addendum Note (Signed)
Addended by: Yevette Edwards on: 11/06/2021 10:28 AM   Modules accepted: Orders

## 2021-11-06 NOTE — Telephone Encounter (Signed)
Tried calling patient. No answer and no voicemail set up for me to leave a message. 

## 2021-11-06 NOTE — Telephone Encounter (Signed)
Called and spoke with patient in regards to recommendations. Pt states that he does feel better on the diuretics. He is aware that I will send his [facility] supply to [facility] as requested. He has been scheduled for a f/u with Dr. Loletha Carrow on Thursday, 01/02/22 at 2 pm. Pt knows to call sooner if significant ascites returns. Pt states that he can't find the letter that was sent to him anymore. He requested that I place a copy in the mail for him. I told him that I will send it to him as a my chart message as well. Pt verbalized understanding and had no concerns at the end of the call.

## 2021-11-07 NOTE — Telephone Encounter (Signed)
Tried calling patient. No answer and no voicemail set up for me to leave a message.  I am unsure of who would have called the patient on 01/03 but was calling him back to see if he had any questions or concerns. I will mail him a letter at this time as I have not been able to reach him x3.

## 2021-11-07 NOTE — Telephone Encounter (Signed)
Ammaar is returning [employer]. Advised him of her documentation and he stated he does not have any questions or concerns he was just returning the call he received. No callback or letter is necessary since she is unsure who called.

## 2021-11-21 ENCOUNTER — Encounter: Payer: Self-pay | Admitting: Sports Medicine

## 2021-11-25 DIAGNOSIS — N1832 Chronic kidney disease, stage 3b: Secondary | ICD-10-CM | POA: Diagnosis not present

## 2021-12-03 ENCOUNTER — Telehealth: Payer: Self-pay | Admitting: Sports Medicine

## 2021-12-03 NOTE — Telephone Encounter (Signed)
Pt left message yesterday afternoon stating he needed an appt for diabetic shoes.  I returned call today and his voicemail is full. I have also sent him a my chart message for him to call to schedule an appt in the [place] office as of now we have a few openings tomorrow morning.

## 2021-12-04 ENCOUNTER — Ambulatory Visit: Payer: Medicare Other

## 2021-12-04 DIAGNOSIS — M2040 Other hammer toe(s) (acquired), unspecified foot: Secondary | ICD-10-CM

## 2021-12-04 DIAGNOSIS — E1122 Type 2 diabetes mellitus with diabetic chronic kidney disease: Secondary | ICD-10-CM | POA: Diagnosis not present

## 2021-12-04 DIAGNOSIS — N1832 Chronic kidney disease, stage 3b: Secondary | ICD-10-CM | POA: Diagnosis not present

## 2021-12-04 DIAGNOSIS — D631 Anemia in chronic kidney disease: Secondary | ICD-10-CM | POA: Diagnosis not present

## 2021-12-04 DIAGNOSIS — E8809 Other disorders of plasma-protein metabolism, not elsewhere classified: Secondary | ICD-10-CM | POA: Diagnosis not present

## 2021-12-04 DIAGNOSIS — M2142 Flat foot [pes planus] (acquired), left foot: Secondary | ICD-10-CM

## 2021-12-04 DIAGNOSIS — M2141 Flat foot [pes planus] (acquired), right foot: Secondary | ICD-10-CM

## 2021-12-04 DIAGNOSIS — I129 Hypertensive chronic kidney disease with stage 1 through stage 4 chronic kidney disease, or unspecified chronic kidney disease: Secondary | ICD-10-CM | POA: Diagnosis not present

## 2021-12-04 DIAGNOSIS — N2581 Secondary hyperparathyroidism of renal origin: Secondary | ICD-10-CM | POA: Diagnosis not present

## 2021-12-04 DIAGNOSIS — E1141 Type 2 diabetes mellitus with diabetic mononeuropathy: Secondary | ICD-10-CM

## 2021-12-04 DIAGNOSIS — K7460 Unspecified cirrhosis of liver: Secondary | ICD-10-CM | POA: Diagnosis not present

## 2021-12-04 NOTE — Progress Notes (Signed)
SITUATION Reason for Consult: Evaluation for Prefabricated Diabetic Shoes and Bilateral Custom Diabetic Inserts. Patient / Caregiver Report: Patient would like well fitting shoes  OBJECTIVE DATA: Patient History / Diagnosis:    ICD-10-CM   1. 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 Pes planus of both feet  M21.41    M21.42     3. Hammer toe, unspecified laterality  M20.40       Current or Previous Devices:   Historical db shoe use  In-Person Foot Examination: Ulcers & Callousing:   None and no history  Toe / Foot Deformities:   - Pes Planus  - Hammertoes  Shoe Size: 11W  ORTHOTIC RECOMMENDATION Recommended Devices: - 1x pair prefabricated PDAC approved diabetic shoes: Patient selects Apex A3200M 11W - 3x pair custom-to-patient vacuum formed diabetic insoles.   GOALS OF SHOES AND INSOLES - Reduce shear and pressure - Reduce / Prevent callus formation - Reduce / Prevent ulceration - Protect the fragile healing compromised diabetic foot.  Patient would benefit from diabetic shoes and inserts as patient has diabetes mellitus and the patient has one or more of the following conditions: - Peripheral neuropathy with evidence of callus formation - Foot deformity - Poor circulation  ACTIONS PERFORMED Patient was casted for insoles via crush box and measured for shoes via brannock device. Procedure was explained and patient tolerated procedure well. All questions were answered and concerns addressed.  PLAN Patient is to ensure treating physician receives and completes diabetic paperwork. Casts and shoe order are to be held until paperwork is received. Once received patient is to be scheduled for fitting in four weeks.

## 2021-12-11 ENCOUNTER — Encounter: Payer: Self-pay | Admitting: Sports Medicine

## 2021-12-11 ENCOUNTER — Ambulatory Visit (INDEPENDENT_AMBULATORY_CARE_PROVIDER_SITE_OTHER): Payer: Medicare Other | Admitting: Sports Medicine

## 2021-12-11 DIAGNOSIS — G8929 Other chronic pain: Secondary | ICD-10-CM

## 2021-12-11 DIAGNOSIS — B351 Tinea unguium: Secondary | ICD-10-CM

## 2021-12-11 DIAGNOSIS — M79675 Pain in left toe(s): Secondary | ICD-10-CM | POA: Diagnosis not present

## 2021-12-11 DIAGNOSIS — E1140 Type 2 diabetes mellitus with diabetic neuropathy, unspecified: Secondary | ICD-10-CM

## 2021-12-11 DIAGNOSIS — H9190 Unspecified hearing loss, unspecified ear: Secondary | ICD-10-CM

## 2021-12-11 DIAGNOSIS — F32A Depression, unspecified: Secondary | ICD-10-CM | POA: Insufficient documentation

## 2021-12-11 DIAGNOSIS — M79674 Pain in right toe(s): Secondary | ICD-10-CM | POA: Diagnosis not present

## 2021-12-11 DIAGNOSIS — H903 Sensorineural hearing loss, bilateral: Secondary | ICD-10-CM

## 2021-12-11 DIAGNOSIS — E1141 Type 2 diabetes mellitus with diabetic mononeuropathy: Secondary | ICD-10-CM

## 2021-12-11 HISTORY — DX: Type 2 diabetes mellitus with diabetic neuropathy, unspecified: E11.40

## 2021-12-11 HISTORY — DX: Sensorineural hearing loss, bilateral: H90.3

## 2021-12-11 HISTORY — DX: Other chronic pain: G89.29

## 2021-12-11 HISTORY — DX: Unspecified hearing loss, unspecified ear: H91.90

## 2021-12-11 NOTE — Progress Notes (Signed)
Subjective: Matthew Ferguson is a 76 y.o. male patient with history of diabetes who presents to office today complaining of long,mildly painful nails  while ambulating in shoes; unable to trim.  Patient reports that he had a flareup of pain at the baby toe joint and also had a flareup of pain at the lateral left knee states that it has now resolved and states that he also gets a burning sensation over the top of the right greater than left foot.  Patient denies any other pedal complaints.  Fasting blood sugar 124 A1c 6.7 Last visit to PCP Dr. Reesa Chew 2 to 3 weeks ago Patient Active Problem List   Diagnosis Date Noted   Chronic back pain 12/11/2021   Depression 12/11/2021   Hearing loss 12/11/2021   Sensorineural hearing loss, bilateral 12/11/2021   Type 2 diabetes mellitus with diabetic neuropathy, unspecified ([place]) 12/11/2021   Rectal bleeding    Diverticulosis of colon without hemorrhage    Acute blood loss anemia 04/01/2021   Symptomatic anemia 03/31/2021   Hepatitis-C 03/31/2021   Pre-op evaluation 09/09/2019   S/P left knee arthroscopy 08/10/2018   Coronary artery disease involving native coronary artery 05/29/2015   Essential hypertension 05/29/2015   Mixed hyperlipidemia 05/29/2015   Type 2 diabetes mellitus ([place]) 05/29/2015   Current Outpatient Medications on File Prior to Visit  Medication Sig Dispense Refill   acetaminophen (TYLENOL) 500 MG tablet Take 500 mg by mouth as needed for mild pain.     fluticasone (FLONASE) 50 MCG/ACT nasal spray Place 1 spray into both nostrils as needed for congestion.     FREESTYLE LITE test strip      furosemide (LASIX) 40 MG tablet Take 0.5 tablets (20 mg total) by mouth daily. 45 tablet 1   hydrocortisone (ANUSOL-HC) 25 MG suppository Place 1 suppository (25 mg total) rectally 2 (two) times daily as needed for hemorrhoids or anal itching. (Patient taking differently: Place 25 mg rectally as needed for hemorrhoids or anal itching.) 12 suppository 1    isosorbide mononitrate (IMDUR) 30 MG 24 hr tablet Take 1 tablet (30 mg total) by mouth daily. 90 tablet 3   loperamide (IMODIUM) 2 MG capsule Take 4 mg by mouth as needed for diarrhea or loose stools.     Microlet Lancets MISC      nadolol (CORGARD) 20 MG tablet Take 1 tablet by mouth daily.     nitroGLYCERIN (NITROSTAT) 0.4 MG SL tablet DISSOLVE 1 TABLET UNDER THE TONGUE EVERY 5 MINUTES FOR UP TO 3 DOSES AS NEEDED FOR CHEST PAIN 25 tablet 7   pantoprazole (PROTONIX) 40 MG tablet Take 40 mg by mouth daily.     sertraline (ZOLOFT) 25 MG tablet Take 25 mg by mouth daily.     spironolactone (ALDACTONE) 100 MG tablet Take 0.5 tablets (50 mg total) by mouth daily. 45 tablet 1   No current facility-administered medications on file prior to visit.   Allergies  Allergen Reactions   Anesthetics, Amide Other (See Comments)    UNKNOWN   Lidocaine Other (See Comments)    UNKNOWN      Objective: General: Patient is awake, alert, and oriented x 3 and in no acute distress.  Integument: Skin is warm, dry and supple bilateral. Nails are minimally elongated thickened and dystrophic with subungual debris, consistent with onychomycosis, 1-5 bilateral. No signs of infection. No open lesions or preulcerative lesions present bilateral. Remaining integument unremarkable.  Vasculature:  Dorsalis Pedis pulse 1/4 bilateral. Posterior Tibial pulse  1/4  bilateral. Capillary fill time <3 sec 1-5 bilateral. Positive hair growth to the level of the digits.Temperature gradient within normal limits. Minimal varicosities present bilateral. No edema present bilateral.   Neurology:  Subjective pain to dorsal lateral foot right greater than left vertigo which are likely related to the nerves.  Musculoskeletal:  Asymptomatic pedal hammertoe and pes planus deformities noted bilateral. Muscular strength 5/5 in all lower extremity muscular groups bilateral.  Assessment and Plan: Problem List Items Addressed This Visit    None Visit Diagnoses     Pain due to onychomycosis of toenails of both feet    -  Primary   Neuritis due to diabetes mellitus ([place])           -Examined patient. -Re-Discussed and educated patient on diabetic foot care and neuritis -Mechanically debrided all nails 1-5 bilateral using sterile nail nipper and filed with dremel without incident  -Advised patient to use topical over-the-counter pain cream as needed for burning sensation if worsens to return to office sooner -Patient is awaiting diabetic shoes -Answered all patient questions -Patient to return  in 3 months for at risk foot care and as scheduled for diabetic shoe measurements.  Landis Martins, DPM

## 2021-12-30 ENCOUNTER — Telehealth: Payer: Self-pay | Admitting: Physician Assistant

## 2021-12-30 DIAGNOSIS — N189 Chronic kidney disease, unspecified: Secondary | ICD-10-CM | POA: Insufficient documentation

## 2021-12-30 DIAGNOSIS — T8859XA Other complications of anesthesia, initial encounter: Secondary | ICD-10-CM | POA: Insufficient documentation

## 2021-12-30 DIAGNOSIS — K7460 Unspecified cirrhosis of liver: Secondary | ICD-10-CM

## 2021-12-30 DIAGNOSIS — R188 Other ascites: Secondary | ICD-10-CM | POA: Insufficient documentation

## 2021-12-30 DIAGNOSIS — K589 Irritable bowel syndrome without diarrhea: Secondary | ICD-10-CM | POA: Insufficient documentation

## 2021-12-30 DIAGNOSIS — K219 Gastro-esophageal reflux disease without esophagitis: Secondary | ICD-10-CM | POA: Insufficient documentation

## 2021-12-30 HISTORY — DX: Unspecified cirrhosis of liver: K74.60

## 2021-12-30 NOTE — Telephone Encounter (Signed)
Patient with PMH of CAD, HTN, HLD and symptomatic anemia. He had PCI of RCA in 2013. Previously had severe anemia in 2022. Wife called after hour answering service regarding chest pain and low BP. Per wife, he has been having more chest pain lately, about once per week. He had chest pain this afternoon, took 2 sublingual nitro before it went away. SBP dropped down to 85, however able to climb up to 105 then 119/59. He need cardiology evaluation for chest pain. He is scheduled to see Dr. Bettina Gavia on Wednesday.   He is aware that if chest pain does not go away after 2 sublingual nitro, he needs to go to ED for eval.

## 2021-12-31 ENCOUNTER — Telehealth: Payer: Self-pay

## 2021-12-31 DIAGNOSIS — N186 End stage renal disease: Secondary | ICD-10-CM | POA: Diagnosis not present

## 2021-12-31 DIAGNOSIS — R519 Headache, unspecified: Secondary | ICD-10-CM | POA: Diagnosis not present

## 2021-12-31 DIAGNOSIS — R079 Chest pain, unspecified: Secondary | ICD-10-CM | POA: Diagnosis not present

## 2021-12-31 DIAGNOSIS — I208 Other forms of angina pectoris: Secondary | ICD-10-CM | POA: Diagnosis not present

## 2021-12-31 DIAGNOSIS — E1122 Type 2 diabetes mellitus with diabetic chronic kidney disease: Secondary | ICD-10-CM | POA: Diagnosis not present

## 2021-12-31 DIAGNOSIS — R0602 Shortness of breath: Secondary | ICD-10-CM | POA: Diagnosis not present

## 2021-12-31 DIAGNOSIS — R42 Dizziness and giddiness: Secondary | ICD-10-CM | POA: Diagnosis not present

## 2021-12-31 DIAGNOSIS — E1165 Type 2 diabetes mellitus with hyperglycemia: Secondary | ICD-10-CM | POA: Diagnosis not present

## 2021-12-31 DIAGNOSIS — R001 Bradycardia, unspecified: Secondary | ICD-10-CM | POA: Diagnosis not present

## 2021-12-31 NOTE — Telephone Encounter (Signed)
Called patient and informed him of Dr. Joya Gaskins recommendation to go to the ER to be evaluated due to his multiple comorbidities. Patient stated he understood and had no further questions at this time.

## 2021-12-31 NOTE — Telephone Encounter (Signed)
Patient was seen in the ER as advised and states they told him to follow up with his Doctor. He is wanting to know if he should still come in tomorrow due to this or if he should schedule for a later date. Leave a detailed voicemail if he does not answer. Please advise.

## 2021-12-31 NOTE — Telephone Encounter (Signed)
Casts sent to [employer] ?

## 2021-12-31 NOTE — Telephone Encounter (Signed)
CMN Received, Shoes Ordered - Apex [id number] [id number]

## 2022-01-01 ENCOUNTER — Ambulatory Visit (INDEPENDENT_AMBULATORY_CARE_PROVIDER_SITE_OTHER): Payer: Medicare Other

## 2022-01-01 ENCOUNTER — Encounter: Payer: Self-pay | Admitting: Cardiology

## 2022-01-01 ENCOUNTER — Ambulatory Visit (INDEPENDENT_AMBULATORY_CARE_PROVIDER_SITE_OTHER): Payer: Medicare Other | Admitting: Cardiology

## 2022-01-01 ENCOUNTER — Other Ambulatory Visit: Payer: Self-pay

## 2022-01-01 VITALS — BP 119/67 | HR 48 | Ht 69.0 in | Wt 175.0 lb

## 2022-01-01 DIAGNOSIS — I10 Essential (primary) hypertension: Secondary | ICD-10-CM

## 2022-01-01 DIAGNOSIS — R001 Bradycardia, unspecified: Secondary | ICD-10-CM

## 2022-01-01 DIAGNOSIS — I952 Hypotension due to drugs: Secondary | ICD-10-CM

## 2022-01-01 DIAGNOSIS — E782 Mixed hyperlipidemia: Secondary | ICD-10-CM | POA: Diagnosis not present

## 2022-01-01 DIAGNOSIS — K7460 Unspecified cirrhosis of liver: Secondary | ICD-10-CM

## 2022-01-01 DIAGNOSIS — R188 Other ascites: Secondary | ICD-10-CM

## 2022-01-01 DIAGNOSIS — R002 Palpitations: Secondary | ICD-10-CM

## 2022-01-01 DIAGNOSIS — I25118 Atherosclerotic heart disease of native coronary artery with other forms of angina pectoris: Secondary | ICD-10-CM

## 2022-01-01 NOTE — Patient Instructions (Signed)
Medication Instructions:  ? ?STOP TAKING FUROSEMIDE  ? ?START TAKING NADOLOL 20 MG  EVERY OTHER DAY  ? ?*If you need a refill on your cardiac medications before your next appointment, please call your pharmacy* ? ? ?Lab Work:  NONE ORDERED  TODAY ? ? ? ?If you have labs (blood work) drawn today and your tests are completely normal, you will receive your results only by: ?MyChart Message (if you have MyChart) OR ?A paper copy in the mail ?If you have any lab test that is abnormal or we need to change your treatment, we will call you to review the results. ? ? ?Testing/Procedures: Your physician has recommended that you wear an event monitor. Event monitors are medical devices that record the heart?s electrical activity. Doctors most often [place] these monitors to diagnose arrhythmias. Arrhythmias are problems with the speed or rhythm of the heartbeat. The monitor is a small, portable device. You can wear one while you do your normal daily activities. This is usually used to diagnose what is causing palpitations/syncope (passing out). ? ? ? ?Follow-Up: ?At [facility], you and your health needs are our priority.  As part of our continuing mission to provide you with exceptional heart care, we have created designated Provider Care Teams.  These Care Teams include your primary Cardiologist (physician) and Advanced Practice Providers (APPs -  Physician Assistants and Nurse Practitioners) who all work together to provide you with the care you need, when you need it. ? ?We recommend signing up for the patient portal called "MyChart".  Sign up information is provided on this After Visit Summary.  MyChart is used to connect with patients for Virtual Visits (Telemedicine).  Patients are able to view lab/test results, encounter notes, upcoming appointments, etc.  Non-urgent messages can be sent to your provider as well.   ?To learn more about what you can do with MyChart, go to [email or web address].   ? ?Your next  appointment:   ?1 month(s) ? ?The format for your next appointment:   ?In Person ? ?Provider:   ?Shirlee More, MD{ ? ? ? ? ?Other Instructions ? ?

## 2022-01-01 NOTE — Progress Notes (Signed)
Cardiology Office Note:    Date:  01/01/2022   ID:  Matthew Ferguson, DOB [birth year], MRN [id number]  PCP:  Garwin Brothers, MD  Cardiologist:  Shirlee More, MD    Referring MD: Garwin Brothers, MD    ASSESSMENT:    1. Hypotension due to drugs   2. Palpitations   3. Sinus bradycardia   4. Coronary artery disease of native artery of native heart with stable angina pectoris ([place])   5. Essential hypertension   6. Mixed hyperlipidemia   7. Cirrhosis of liver with ascites, unspecified hepatic cirrhosis type ([place])    PLAN:    In order of problems listed above:  Fortunately his presentation was not acute coronary syndrome or recurrent GI bleed and severe anemia. I suspect this is drug related I will have him stop his loop diuretic he is not edematous and reduce his beta-blocker to every other day with heart rates less than 50 Apply a life monitor to try to capture these episodes of rapid heart rhythm I am concerned he may be having atrial fibrillation or flutter. Stable CAD continue medical therapy he is on an oral nitrate.  I consider him a very poor candidate for cardiac interventions with his liver disease portal hypertension previous GI bleeding and would not consider coronary angiography or interventions at this time No longer on ACE inhibitor and reconciled his medications Presently not on lipid-lowering therapy with his liver disease He diffusely feels L weak and I told him this may well be a consequence of his severe liver disease   Next appointment: 1 month   Medication Adjustments/Labs and Tests Ordered: Current medicines are reviewed at length with the patient today.  Concerns regarding medicines are outlined above.  No orders of the defined types were placed in this encounter.  No orders of the defined types were placed in this encounter.   Chief complaint nausea in the ED yesterday multiple problems including low blood pressure weakness rapid heart rhythm and chest pain at  times   History of Present Illness:    Matthew Ferguson is a 76 y.o. male with a hx of CAD with PCI of the right coronary artery in 2013 hypertension hyperlipidemia type 2 diabetes hepatitis C with cirrhosis and portal venous hypertension and ascites and previous severe anemia requiring hospitalization and admission to [facility] in May 2022 hemoglobin of 6.9 associated with GI bleed. Endoscopy showed esophageal varices portal gastropathy and colonoscopy is normal except for internal hemorrhoids.  He was last seen by me 07/03/2021.Marland Kitchen  He is being seen today as a work-in after a phone call to the office that he had chest pain and hypotension.  Compliance with diet, lifestyle and medications: Yes  4 weeks he had been having episodes of lightheadedness and he gets blood pressure as low as 80 systolic.  He is taking a loop diuretic furosemide along with spironolactone with his cirrhosis and ascites.  He is on beta-blocker and his resting heart rates are less than 50 at home. In general he feels very badly very weak and is unsure if it is due to his liver disease or other problems He also has had episodes several times a week where briefly his heart races he gets chest discomfort within it makes him feel weak these have not been captured. His chest pain resolves and his heart rate slows when he takes nitroglycerin. Pattern is stable  At my advice he was seen in the ED [place] yesterday  in view of his previous severe anemia and reported hypotension.  Blood pressure was 140/69 pulse 49.  In the ED his evaluation showed a hemoglobin of 10.8 platelets 118,000 GFR 38 cc close to baseline of 41 creatinine 1.80.  His initial and delta troponins were normal BNP level was elevated 1310 chest x-ray showed no acute findings.  Past Medical History:  Diagnosis Date   Ascites    Chronic kidney disease    Cirrhosis ([place])    Complication of anesthesia    Post operative vomitting for  two weeks    Coronary artery disease involving native coronary artery 05/29/2015   05/08/12 had PCI and Xience stent to RCA for Troponin normal unstable angina with residual 40-50% proximal and 30-40 % mid LAD stenosis, EF normal 55-60%   Essential hypertension 05/29/2015   GERD (gastroesophageal reflux disease)    Hepatitis-C 03/31/2021   undetectable virus on 03/31/2021.     Irritable bowel syndrome    Mixed hyperlipidemia 05/29/2015   Type 2 diabetes mellitus ([place]) 05/29/2015    Past Surgical History:  Procedure Laterality Date   BACK SURGERY     CARDIAC CATHETERIZATION     CHOLECYSTECTOMY     COLONOSCOPY WITH PROPOFOL N/A 04/02/2021   Procedure: COLONOSCOPY WITH PROPOFOL;  Surgeon: Irene Shipper, MD;  Location: [facility] ENDOSCOPY;  Service: Endoscopy;  Laterality: N/A;   CORONARY ANGIOPLASTY WITH STENT PLACEMENT     ESOPHAGOGASTRODUODENOSCOPY (EGD) WITH PROPOFOL N/A 04/01/2021   Procedure: ESOPHAGOGASTRODUODENOSCOPY (EGD) WITH PROPOFOL;  Surgeon: Doran Stabler, MD;  Location: [place];  Service: Gastroenterology;  Laterality: N/A;   INGUINAL HERNIA REPAIR Bilateral 01/2021   IR PARACENTESIS  10/17/2021   KNEE SURGERY      Current Medications: Current Meds  Medication Sig   acetaminophen (TYLENOL) 500 MG tablet Take 500 mg by mouth as needed for mild pain.   fluticasone (FLONASE) 50 MCG/ACT nasal spray Place 1 spray into both nostrils as needed for congestion.   FREESTYLE LITE test strip    furosemide (LASIX) 40 MG tablet Take 0.5 tablets (20 mg total) by mouth daily.   hydrocortisone (ANUSOL-HC) 25 MG suppository Place 1 suppository (25 mg total) rectally 2 (two) times daily as needed for hemorrhoids or anal itching. (Patient taking differently: Place 25 mg rectally as needed for hemorrhoids or anal itching.)   isosorbide mononitrate (IMDUR) 30 MG 24 hr tablet Take 1 tablet (30 mg total) by mouth daily.   loperamide (IMODIUM) 2 MG capsule Take 4 mg by mouth as needed for diarrhea  or loose stools.   Microlet Lancets MISC    nadolol (CORGARD) 20 MG tablet Take 1 tablet by mouth daily.   nitroGLYCERIN (NITROSTAT) 0.4 MG SL tablet DISSOLVE 1 TABLET UNDER THE TONGUE EVERY 5 MINUTES FOR UP TO 3 DOSES AS NEEDED FOR CHEST PAIN   pantoprazole (PROTONIX) 40 MG tablet Take 40 mg by mouth daily.   sertraline (ZOLOFT) 25 MG tablet Take 25 mg by mouth daily.   spironolactone (ALDACTONE) 100 MG tablet Take 0.5 tablets (50 mg total) by mouth daily.     Allergies:   Anesthetics, amide and Lidocaine   Social History   Socioeconomic History   Marital status: Married    Spouse name: Pat   Number of children: 3   Years of education: Not on file   Highest education level: Not on file  Occupational History   Occupation: retired  Tobacco Use   Smoking status: Never   Smokeless tobacco: Never  Vaping Use   Vaping Use: Never used  Substance and Sexual Activity   Alcohol use: Never   Drug use: Never   Sexual activity: Not on file  Other Topics Concern   Not on file  Social History Narrative   Not on file   Social Determinants of Health   Financial Resource Strain: Not on file  Food Insecurity: Not on file  Transportation Needs: Not on file  Physical Activity: Not on file  Stress: Not on file  Social Connections: Not on file     Family History: The patient's family history includes Congenital heart disease in his daughter; Diabetes in his father; Heart disease in his father; Other in his mother; Stroke in his father. There is no history of Colon cancer, Esophageal cancer, Pancreatic cancer, or Stomach cancer. ROS:   Please see the history of present illness.    All other systems reviewed and are negative.  EKGs/Labs/Other Studies Reviewed:    The following studies were reviewed today:   Physical Exam:    VS:  BP 119/67    Pulse (!) 48    Ht 5\' 9"  (1.753 m)    Wt 175 lb (79.4 kg)    BMI 25.84 kg/m     Wt Readings from Last 3 Encounters:  01/01/22 175 lb (79.4  kg)  10/15/21 193 lb 8 oz (87.8 kg)  07/03/21 190 lb (86.2 kg)     GEN:  Well nourished, well developed in no acute distress HEENT: Normal NECK: No JVD; No carotid bruits LYMPHATICS: No lymphadenopathy CARDIAC: RRR, no murmurs, rubs, gallops RESPIRATORY:  Clear to auscultation without rales, wheezing or rhonchi  ABDOMEN: Soft, non-tender, non-distended MUSCULOSKELETAL:  No edema; No deformity  SKIN: Warm and dry NEUROLOGIC:  Alert and oriented x 3 PSYCHIATRIC:  Normal affect    Signed, Shirlee More, MD  01/01/2022 3:26 PM    [facility]

## 2022-01-01 NOTE — Telephone Encounter (Signed)
Appt has been schedule.

## 2022-01-02 ENCOUNTER — Ambulatory Visit (INDEPENDENT_AMBULATORY_CARE_PROVIDER_SITE_OTHER): Payer: Medicare Other | Admitting: Gastroenterology

## 2022-01-02 ENCOUNTER — Encounter: Payer: Self-pay | Admitting: Gastroenterology

## 2022-01-02 VITALS — BP 110/72 | HR 54 | Ht 69.0 in | Wt 177.2 lb

## 2022-01-02 DIAGNOSIS — D689 Coagulation defect, unspecified: Secondary | ICD-10-CM | POA: Diagnosis not present

## 2022-01-02 DIAGNOSIS — I951 Orthostatic hypotension: Secondary | ICD-10-CM

## 2022-01-02 DIAGNOSIS — R002 Palpitations: Secondary | ICD-10-CM | POA: Diagnosis not present

## 2022-01-02 DIAGNOSIS — I8510 Secondary esophageal varices without bleeding: Secondary | ICD-10-CM

## 2022-01-02 DIAGNOSIS — I25118 Atherosclerotic heart disease of native coronary artery with other forms of angina pectoris: Secondary | ICD-10-CM | POA: Diagnosis not present

## 2022-01-02 DIAGNOSIS — R188 Other ascites: Secondary | ICD-10-CM | POA: Diagnosis not present

## 2022-01-02 DIAGNOSIS — K7460 Unspecified cirrhosis of liver: Secondary | ICD-10-CM

## 2022-01-02 DIAGNOSIS — N189 Chronic kidney disease, unspecified: Secondary | ICD-10-CM

## 2022-01-02 DIAGNOSIS — R001 Bradycardia, unspecified: Secondary | ICD-10-CM | POA: Diagnosis not present

## 2022-01-02 DIAGNOSIS — N62 Hypertrophy of breast: Secondary | ICD-10-CM | POA: Diagnosis not present

## 2022-01-02 DIAGNOSIS — D631 Anemia in chronic kidney disease: Secondary | ICD-10-CM

## 2022-01-02 MED ORDER — AMILORIDE HCL 5 MG PO TABS: 5.0000 mg | ORAL_TABLET | Freq: Every day | ORAL | 3 refills | Status: DC

## 2022-01-02 MED ORDER — AMILORIDE HCL 5 MG PO TABS
2.5000 mg | ORAL_TABLET | Freq: Every day | ORAL | 3 refills | Status: DC
Start: 2022-01-02 — End: 2022-01-02

## 2022-01-02 NOTE — Patient Instructions (Signed)
If you are age 78 or older, your body mass index should be between 23-30. Your Body mass index is 26.18 kg/m?Marland Kitchen If this is out of the aforementioned range listed, please consider follow up with your Primary Care Provider. ? ?If you are age 7 or younger, your body mass index should be between 19-25. Your Body mass index is 26.18 kg/m?Marland Kitchen If this is out of the aformentioned range listed, please consider follow up with your Primary Care Provider.  ? ?Due to recent changes in healthcare laws, you may see the results of your imaging and laboratory studies on MyChart before your provider has had a chance to review them.  We understand that in some cases there may be results that are confusing or concerning to you. Not all laboratory results come back in the same time frame and the provider may be waiting for multiple results in order to interpret others.  Please give [place] 48 hours in order for your provider to thoroughly review all the results before contacting the office for clarification of your results.  ? ?Stop your medications-nadolol (Coreg) and spironalactone (Aldactone) ? ?Start taking Amiloride 2.5mg  daily. ? ?It was a pleasure to see you today! ? ?Dr. Loletha Carrow ? ? ? ? ? ? ? ? ?

## 2022-01-02 NOTE — Progress Notes (Signed)
[facility] Progress Note  Chief Complaint: Cirrhosis and esophageal varices with ascites  Subjective  History: I saw Matthew Ferguson in follow-up for his cirrhosis today. He is having a lot more difficulty with fatigue, lightheadedness and gait unsteadiness lately.  He went to a local ED with chest pain earlier this week and was then seen in office follow-up by Dr. Bettina Gavia of cardiology yesterday with notes as below.  Matthew Ferguson is still often lightheaded when he stands and feels generally fatigued.  He is on a heart monitor out of concern he may have some arrhythmia.  Since his paracentesis and resuming diuretics, he has lost a significant amount of weight, his abdomen is no longer distended and his peripheral edema resolved.  He did follow-up with Dr. Candiss Ferguson of nephrology since I last saw him as well.  (December 21)  ROS: Diabetic neuropathy He recently developed bilateral painful gynecomastia Remainder of systems negative septa as above  The patient's Past Medical, Family and Social History were reviewed and are on file in the EMR. Cardiology office note from yesterday includes the following:  "4 weeks he had been having episodes of lightheadedness and he gets blood pressure as low as 80 systolic.  He is taking a loop diuretic furosemide along with spironolactone with his cirrhosis and ascites.  He is on beta-blocker and his resting heart rates are less than 50 at home. In general he feels very badly very weak and is unsure if it is due to his liver disease or other problems He also has had episodes several times a week where briefly his heart races he gets chest discomfort within it makes him feel weak these have not been captured. His chest pain resolves and his heart rate slows when he takes nitroglycerin. Pattern is stable   At my advice he was seen in the ED Matthew Ferguson yesterday in view of his previous severe anemia and reported hypotension.  Blood pressure was  140/69 pulse 49.  In the ED his evaluation showed a hemoglobin of 10.8 platelets 118,000 GFR 38 cc close to baseline of 41 creatinine 1.80.  His initial and delta troponins were normal BNP level was elevated 1310 chest x-ray showed no acute findings.  _________________________    In order of problems listed above:   Fortunately his presentation was not acute coronary syndrome or recurrent GI bleed and severe anemia. I suspect this is drug related I will have him stop his loop diuretic he is not edematous and reduce his beta-blocker to every other day with heart rates less than 50 Apply a life monitor to try to capture these episodes of rapid heart rhythm I am concerned he may be having atrial fibrillation or flutter. Stable CAD continue medical therapy he is on an oral nitrate.  I consider him a very poor candidate for cardiac interventions with his liver disease portal hypertension previous GI bleeding and would not consider coronary angiography or interventions at this time No longer on ACE inhibitor and reconciled his medications Presently not on lipid-lowering therapy with his liver disease He diffusely feels L weak and I told him this may well be a consequence of his severe liver disease   Next appointment: 1 month" Objective:  Med list reviewed  Current Outpatient Medications:    acetaminophen (TYLENOL) 500 MG tablet, Take 500 mg by mouth as needed for mild pain., Disp: , Rfl:    aMILoride (MIDAMOR) 5 MG tablet, Take 1 tablet (5 mg total)  by mouth daily., Disp: 30 tablet, Rfl: 3   fluticasone (FLONASE) 50 MCG/ACT nasal spray, Place 1 spray into both nostrils as needed for congestion., Disp: , Rfl:    FREESTYLE LITE test strip, , Disp: , Rfl:    hydrocortisone (ANUSOL-HC) 25 MG suppository, Place 1 suppository (25 mg total) rectally 2 (two) times daily as needed for hemorrhoids or anal itching. (Patient taking differently: Place 25 mg rectally as needed for hemorrhoids or anal  itching.), Disp: 12 suppository, Rfl: 1   isosorbide mononitrate (IMDUR) 30 MG 24 hr tablet, Take 1 tablet (30 mg total) by mouth daily., Disp: 90 tablet, Rfl: 3   loperamide (IMODIUM) 2 MG capsule, Take 4 mg by mouth as needed for diarrhea or loose stools., Disp: , Rfl:    Microlet Lancets MISC, , Disp: , Rfl:    nitroGLYCERIN (NITROSTAT) 0.4 MG SL tablet, DISSOLVE 1 TABLET UNDER THE TONGUE EVERY 5 MINUTES FOR UP TO 3 DOSES AS NEEDED FOR CHEST PAIN, Disp: 25 tablet, Rfl: 7   pantoprazole (PROTONIX) 40 MG tablet, Take 40 mg by mouth daily., Disp: , Rfl:    sertraline (ZOLOFT) 25 MG tablet, Take 25 mg by mouth daily., Disp: , Rfl:    Vital signs in last 24 hrs: Vitals:   01/02/22 1353  BP: 110/72  Pulse: (!) 54  SpO2: 98%   Wt Readings from Last 3 Encounters:  01/02/22 177 lb 4 oz (80.4 kg)  01/01/22 175 lb (79.4 kg)  10/15/21 193 lb 8 oz (87.8 kg)  Note that he has diuresed 15 to 16 pounds since his December visit with me. Physical Exam  Fatigued, chronically ill-appearing.  Briefly lightheaded while standing, gets on exam table slowly but without assistance.  Poor muscle mass.  Ambulating with a cane HEENT: sclera anicteric, oral mucosa moist without lesions Neck: supple, no thyromegaly, JVD or lymphadenopathy Cardiac: RRR without murmurs, S1S2 heard, no peripheral edema.  Heart monitor left upper chest wall Pulm: clear to auscultation bilaterally, normal RR and effort noted Abdomen: soft, no tenderness, with active bowel sounds. No guarding or palpable hepatosplenomegaly. Skin; warm and dry, no jaundice or rash Symmetrical bilateral gynecomastia, tender without palpable mass or nodules, normal overlying skin. Labs:  12/31/21 at ED: Glucose 233  Na 134, cr 1.8 remainder CMP nml Albumin 3.1  Wbc 5.4   Hgb 10.8    Hct   32   plt 118k  Troponin negative   ___________________________________________ Radiologic studies: Nml CXR  12/31/21  ____________________________________________ Other:   _____________________________________________ Assessment & Plan  Assessment: Encounter Diagnoses  Name Primary?   Cirrhosis of liver with ascites, unspecified hepatic cirrhosis type ([place]) Yes   Secondary esophageal varices without bleeding (HCC)    Anemia in chronic kidney disease, unspecified CKD stage    Other ascites    Coagulopathy (HCC)    Orthostatic hypotension    Gynecomastia    Cryptogenic cirrhosis with portal hypertension and esophageal varices with thrombocytopenia and coagulopathy. He has ascites that has required paracentesis a few times over the last year, most recently December.  Diuretics were resumed at low-dose of Aldactone 50 mg a day and furosemide 20 mg daily on the direction of Dr. Candiss Ferguson. He is also on nadolol 20 mg daily until it was decreased yesterday to every other day by cardiology because of his persistent hypotension and bradycardia. He is also on a heart monitor to see if there is any atrial arrhythmia such as A-fib.  Matthew Ferguson is not tolerating the diuretics  well with hypotension, and I agree he should remain off furosemide, at least he should not be taking it regularly.  He also has gynecomastia from the spironolactone, so I have changed it to amiloride 5 mg once daily.  I have decided to stop the nadolol altogether, as I am concerned he is risking a fall with persistent hypotension and bradycardia. Since he can no longer tolerate the beta-blocker as primary prophylaxis, he will need upper endoscopy with elective variceal banding as primary prophylaxis against bleeding. Before pursuing that, I will wait for the results of his cardiology work-up to see whether he needs any other specific therapy for arrhythmia that may be discovered and whether he needs to be on any oral anticoagulation.  Plan: Stop spironolactone stop nadolol Stop nadolol Start amiloride 5 mg once daily  eventual EGD for  elective variceal banding (see above -care coordination required  He may need periodic short courses of furosemide or paracentesis if he develops significant ascites with weight gain and discomfort as before. I will copy my note to cardiology and nephrology, as I think his blood pressure and volume/diuretic issues would be best managed and coordinated by those 2 consultants in this patient.   35 minutes were spent on this encounter (including chart review, history/exam, counseling/coordination of care, and documentation) > 50% of that time was spent on counseling and coordination of care.   Matthew Ferguson

## 2022-01-22 ENCOUNTER — Encounter: Payer: Self-pay | Admitting: Gastroenterology

## 2022-01-22 ENCOUNTER — Telehealth: Payer: Self-pay

## 2022-01-22 DIAGNOSIS — K7460 Unspecified cirrhosis of liver: Secondary | ICD-10-CM

## 2022-01-22 NOTE — Telephone Encounter (Signed)
Called pt to schedule diabetic shoe pick up, vm full so a message was sent through mychart.

## 2022-01-23 ENCOUNTER — Other Ambulatory Visit: Payer: Self-pay

## 2022-01-23 ENCOUNTER — Ambulatory Visit: Payer: Medicare Other

## 2022-01-23 DIAGNOSIS — K7460 Unspecified cirrhosis of liver: Secondary | ICD-10-CM

## 2022-01-23 MED ORDER — ALBUMIN HUMAN 25 % IV SOLN: 25.0000 g | Freq: Once | INTRAVENOUS | Status: DC

## 2022-01-23 NOTE — Telephone Encounter (Signed)
He needs to be scheduled for a therapeutic paracentesis. ? ?Maximum 5 L fluid removal, and he must receive 25 g of albumin during the procedure.  It can be done next week if that is the next available time in radiology. ? ?I have also sent a message to his nephrologist, Dr. Candiss Norse, requesting his assistance managing the diuretics in the context of recent hypotension requiring changes of medicines. ? ?-HD ?

## 2022-01-23 NOTE — Telephone Encounter (Signed)
IR paracentesis order and Albumin orders are in epic.  ? ?Patient has been scheduled for a paracentesis at [facility] on Friday, 01/24/22 at 2 pm. Pt will need to arrive at 1:30 PM. Patient notified of appt via my chart.  ?

## 2022-01-24 ENCOUNTER — Other Ambulatory Visit: Payer: Self-pay

## 2022-01-24 ENCOUNTER — Ambulatory Visit (HOSPITAL_COMMUNITY)
Admission: RE | Admit: 2022-01-24 | Discharge: 2022-01-24 | Disposition: A | Discharge status: Home / Self Care | Payer: Medicare Other | Source: Ambulatory Visit | Attending: Gastroenterology | Admitting: Gastroenterology

## 2022-01-24 DIAGNOSIS — R188 Other ascites: Secondary | ICD-10-CM | POA: Insufficient documentation

## 2022-01-24 DIAGNOSIS — K7460 Unspecified cirrhosis of liver: Secondary | ICD-10-CM | POA: Insufficient documentation

## 2022-01-24 HISTORY — PX: IR PARACENTESIS: IMG2679

## 2022-01-24 MED ORDER — ALBUMIN HUMAN 25 % IV SOLN: 25.0000 g | Freq: Once | INTRAVENOUS | Status: AC

## 2022-01-24 MED ORDER — LIDOCAINE HCL 1 % IJ SOLN
INTRAMUSCULAR | Status: AC
  Filled 2022-01-24: qty 20

## 2022-01-24 MED ORDER — ALBUMIN HUMAN 25 % IV SOLN
INTRAVENOUS | Status: AC
  Administered 2022-01-24: 25 g via INTRAVENOUS
  Filled 2022-01-24: qty 100

## 2022-01-24 MED ORDER — LIDOCAINE HCL (PF) 1 % IJ SOLN
INTRAMUSCULAR | Status: DC | PRN
  Administered 2022-01-24: 5 mL

## 2022-01-24 NOTE — Procedures (Signed)
PROCEDURE SUMMARY: ? ?Successful US guided therapeutic paracentesis from RLQ  ?Yielded 3.5 L of clear, yellow fluid.  ?No immediate complications.  ?Pt tolerated well.  ? ?Specimen not sent for labs. ? ?EBL < 1 mL ? ?Tyson Alias, AGNP ?01/24/2022 ?2:38 PM ?  ?

## 2022-01-29 ENCOUNTER — Other Ambulatory Visit: Payer: Self-pay

## 2022-01-29 ENCOUNTER — Ambulatory Visit (INDEPENDENT_AMBULATORY_CARE_PROVIDER_SITE_OTHER): Payer: Medicare Other

## 2022-01-29 ENCOUNTER — Ambulatory Visit: Payer: Medicare Other

## 2022-01-29 DIAGNOSIS — M2142 Flat foot [pes planus] (acquired), left foot: Secondary | ICD-10-CM | POA: Diagnosis not present

## 2022-01-29 DIAGNOSIS — E1141 Type 2 diabetes mellitus with diabetic mononeuropathy: Secondary | ICD-10-CM

## 2022-01-29 DIAGNOSIS — M2040 Other hammer toe(s) (acquired), unspecified foot: Secondary | ICD-10-CM | POA: Diagnosis not present

## 2022-01-29 DIAGNOSIS — M2141 Flat foot [pes planus] (acquired), right foot: Secondary | ICD-10-CM

## 2022-01-29 NOTE — Progress Notes (Signed)
SITUATION ?Reason for Visit: Fitting of Diabetic [place] ?Patient / Caregiver Report:  Patient is satisfied with fit and function of shoes and insoles. ? ?OBJECTIVE DATA: ?Patient History / Diagnosis:   ?  ICD-10-CM   ?1. 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  ?2. Pes planus of both feet  M21.41   ? M21.42   ?  ?3. Hammer toe, unspecified laterality  M20.40   ?  ? ? ?Change in Status:   None ? ?ACTIONS PERFORMED: ?In-Person Delivery, patient was fit with: ?- 1x pair A5500 PDAC approved prefabricated Diabetic Shoes: Apex [id number] 11W ?- 3x pair G0174 PDAC approved vacuum formed custom diabetic insoles; [employer]: [id number] ? ?Shoes and insoles were verified for structural integrity and safety. Patient wore shoes and insoles in office. Skin was inspected and free of areas of concern after wearing shoes and inserts. Shoes and inserts fit properly. Patient / Caregiver provided with ferbal instruction and demonstration regarding donning, doffing, wear, care, proper fit, function, purpose, cleaning, and use of shoes and insoles ' and in all related precautions and risks and benefits regarding shoes and insoles. Patient / Caregiver was instructed to wear properly fitting socks with shoes at all times. Patient was also provided with verbal instruction regarding how to report any failures or malfunctions of shoes or inserts, and necessary follow up care. Patient / Caregiver was also instructed to contact physician regarding change in status that may affect function of shoes and inserts.  ? ?Patient / Caregiver verbalized undersatnding of instruction provided. Patient / Caregiver demonstrated independence with proper donning and doffing of shoes and inserts. ? ?PLAN ?Patient to follow with treating physician as recommended. Plan of care was discussed with and agreed upon by patient and/or caregiver. All questions were answered and concerns addressed. ? ?

## 2022-02-03 DIAGNOSIS — D631 Anemia in chronic kidney disease: Secondary | ICD-10-CM | POA: Diagnosis not present

## 2022-02-03 DIAGNOSIS — E8809 Other disorders of plasma-protein metabolism, not elsewhere classified: Secondary | ICD-10-CM | POA: Diagnosis not present

## 2022-02-03 DIAGNOSIS — I129 Hypertensive chronic kidney disease with stage 1 through stage 4 chronic kidney disease, or unspecified chronic kidney disease: Secondary | ICD-10-CM | POA: Diagnosis not present

## 2022-02-03 DIAGNOSIS — N2581 Secondary hyperparathyroidism of renal origin: Secondary | ICD-10-CM | POA: Diagnosis not present

## 2022-02-03 DIAGNOSIS — E1122 Type 2 diabetes mellitus with diabetic chronic kidney disease: Secondary | ICD-10-CM | POA: Diagnosis not present

## 2022-02-03 DIAGNOSIS — N1832 Chronic kidney disease, stage 3b: Secondary | ICD-10-CM | POA: Diagnosis not present

## 2022-02-03 DIAGNOSIS — K7460 Unspecified cirrhosis of liver: Secondary | ICD-10-CM | POA: Diagnosis not present

## 2022-02-05 DIAGNOSIS — N1832 Chronic kidney disease, stage 3b: Secondary | ICD-10-CM | POA: Diagnosis not present

## 2022-02-05 DIAGNOSIS — E782 Mixed hyperlipidemia: Secondary | ICD-10-CM | POA: Diagnosis not present

## 2022-02-05 DIAGNOSIS — E1169 Type 2 diabetes mellitus with other specified complication: Secondary | ICD-10-CM | POA: Diagnosis not present

## 2022-02-05 DIAGNOSIS — I25708 Atherosclerosis of coronary artery bypass graft(s), unspecified, with other forms of angina pectoris: Secondary | ICD-10-CM | POA: Diagnosis not present

## 2022-02-05 DIAGNOSIS — F321 Major depressive disorder, single episode, moderate: Secondary | ICD-10-CM | POA: Diagnosis not present

## 2022-02-11 ENCOUNTER — Encounter: Payer: Self-pay | Admitting: Cardiology

## 2022-02-11 ENCOUNTER — Telehealth: Payer: Self-pay | Admitting: Gastroenterology

## 2022-02-11 ENCOUNTER — Ambulatory Visit (INDEPENDENT_AMBULATORY_CARE_PROVIDER_SITE_OTHER): Payer: Medicare Other | Admitting: Cardiology

## 2022-02-11 VITALS — BP 126/60 | HR 68 | Ht 69.0 in | Wt 186.0 lb

## 2022-02-11 DIAGNOSIS — R188 Other ascites: Secondary | ICD-10-CM | POA: Diagnosis not present

## 2022-02-11 DIAGNOSIS — K7460 Unspecified cirrhosis of liver: Secondary | ICD-10-CM | POA: Diagnosis not present

## 2022-02-11 DIAGNOSIS — E782 Mixed hyperlipidemia: Secondary | ICD-10-CM | POA: Diagnosis not present

## 2022-02-11 DIAGNOSIS — R002 Palpitations: Secondary | ICD-10-CM | POA: Diagnosis not present

## 2022-02-11 DIAGNOSIS — I952 Hypotension due to drugs: Secondary | ICD-10-CM

## 2022-02-11 DIAGNOSIS — N1832 Chronic kidney disease, stage 3b: Secondary | ICD-10-CM | POA: Diagnosis not present

## 2022-02-11 DIAGNOSIS — I25118 Atherosclerotic heart disease of native coronary artery with other forms of angina pectoris: Secondary | ICD-10-CM

## 2022-02-11 MED ORDER — ACEBUTOLOL HCL 200 MG PO CAPS: 200.0000 mg | ORAL_CAPSULE | Freq: Every day | ORAL | 3 refills | Status: DC

## 2022-02-11 NOTE — Progress Notes (Signed)
?Cardiology Office Note:   ? ?Date:  02/11/2022  ? ?ID:  Matthew Ferguson, DOB December 08, 1945, MRN [id number] ? ?PCP:  Matthew Brothers, MD  ?Cardiologist:  Matthew More, MD   ? ?Referring MD: Matthew Brothers, MD  ? ? ?ASSESSMENT:   ? ?1. Hypotension due to drugs   ?2. Palpitations   ?3. Coronary artery disease of native artery of native heart with stable angina pectoris ([place])   ?4. Cirrhosis of liver with ascites, unspecified hepatic cirrhosis type ([place])   ? ?PLAN:   ? ?In order of problems listed above: ? ?Resolved improved off diuretic ?Worsening symptoms of angina restarted low-dose selective beta-blocker Sectral 200 mg daily ?Managed by gastroenterology unfortunately has reaccumulated his ascites in 2 weeks despite MRA ? ? ?Next appointment: 6 months ? ? ?Medication Adjustments/Labs and Tests Ordered: ?Current medicines are reviewed at length with the patient today.  Concerns regarding medicines are outlined above.  ?No orders of the defined types were placed in this encounter. ? ?Meds ordered this encounter  ?Medications  ? acebutolol (SECTRAL) 200 MG capsule  ?  Sig: Take 1 capsule (200 mg total) by mouth daily.  ?  Dispense:  90 capsule  ?  Refill:  3  ? ? ?Chief Complaint  ?Patient presents with  ? Follow-up  ? Coronary Artery Disease  ? Hypotension  ? Bradycardia  ? ? ?History of Present Illness:   ? ?Matthew Ferguson is a 76 y.o. male with a hx of CAD with PCI of the right coronary artery in 2013 hypertension hyperlipidemia type 2 diabetes hepatitis C with cirrhosis and portal venous hypertension and ascites and previous severe anemia requiring hospitalization and admission to [facility] in May 2022 hemoglobin of 6.9 associated with GI bleed. Endoscopy showed esophageal varices portal gastropathy and colonoscopy is normal except for internal hemorrhoids.  He was last seen 01/01/2021 3 with symptomatic hypotension and bradycardia.  I stopped his loop diuretic as he was not edematous and with bradycardia and liver disease  reduce the dose of his beta-blocker. ? ?He had a recent paracentesis 01/24/2022 3.5 L peritoneal fluid. ? ?Compliance with diet, lifestyle and medications: Yes ? ?He is reaccumulated all of his ascites and his weight is above what it was prior to paracentesis ?He is having episodes where he feels his heart race he is captured events in his sinus rhythm sinus tachycardia at times with rapid heart rate he takes nitroglycerin with relief.  He also is having tramadol off his beta-blocker.  He has had no recurrent hypotension or syncope. ? ?He utilized an event monitor reported 01/25/2022 both ventricular and supraventricular ectopy were not frequent his symptomatic episodes were at times associated with APCs and PVCs. ? ?He was subsequently seen in follow-up by his liver specialist who stopped his beta-blocker and transition him to a different MRA with gynecomastia. ?Past Medical History:  ?Diagnosis Date  ? Ascites   ? Chronic kidney disease   ? Cirrhosis ([place])   ? Complication of anesthesia   ? Post operative vomitting for two weeks   ? Coronary artery disease involving native coronary artery 05/29/2015  ? 05/08/12 had PCI and Xience stent to RCA for Troponin normal unstable angina with residual 40-50% proximal and 30-40 % mid LAD stenosis, EF normal 55-60%  ? Essential hypertension 05/29/2015  ? GERD (gastroesophageal reflux disease)   ? Hepatitis-C 03/31/2021  ? undetectable virus on 03/31/2021.    ? Irritable bowel syndrome   ? Mixed hyperlipidemia 05/29/2015  ?  Type 2 diabetes mellitus ([place]) 05/29/2015  ? ? ?Past Surgical History:  ?Procedure Laterality Date  ? BACK SURGERY    ? CARDIAC CATHETERIZATION    ? CHOLECYSTECTOMY    ? COLONOSCOPY WITH PROPOFOL N/A 04/02/2021  ? Procedure: COLONOSCOPY WITH PROPOFOL;  Surgeon: Irene Shipper, MD;  Location: [facility] ENDOSCOPY;  Service: Endoscopy;  Laterality: N/A;  ? CORONARY ANGIOPLASTY WITH STENT PLACEMENT    ? ESOPHAGOGASTRODUODENOSCOPY (EGD) WITH PROPOFOL N/A 04/01/2021  ?  Procedure: ESOPHAGOGASTRODUODENOSCOPY (EGD) WITH PROPOFOL;  Surgeon: Doran Stabler, MD;  Location: [place];  Service: Gastroenterology;  Laterality: N/A;  ? INGUINAL HERNIA REPAIR Bilateral 01/2021  ? IR PARACENTESIS  10/17/2021  ? IR PARACENTESIS  01/24/2022  ? KNEE SURGERY    ? ? ?Current Medications: ?Current Meds  ?Medication Sig  ? acebutolol (SECTRAL) 200 MG capsule Take 1 capsule (200 mg total) by mouth daily.  ? fluticasone (FLONASE) 50 MCG/ACT nasal spray Place 1 spray into both nostrils as needed for congestion.  ? FREESTYLE LITE test strip   ? isosorbide mononitrate (IMDUR) 30 MG 24 hr tablet Take 1 tablet (30 mg total) by mouth daily.  ? Microlet Lancets MISC   ? nitroGLYCERIN (NITROSTAT) 0.4 MG SL tablet DISSOLVE 1 TABLET UNDER THE TONGUE EVERY 5 MINUTES FOR UP TO 3 DOSES AS NEEDED FOR CHEST PAIN  ? pantoprazole (PROTONIX) 40 MG tablet Take 40 mg by mouth daily.  ? sertraline (ZOLOFT) 25 MG tablet Take 25 mg by mouth daily.  ? spironolactone (ALDACTONE) 25 MG tablet Take 25 mg by mouth daily at 12 noon.  ? ?Current Facility-Administered Medications for the 02/11/22 encounter (Office Visit) with Richardo Priest, MD  ?Medication  ? albumin human 25 % solution 25 g  ?  ? ?Allergies:   Anesthetics, amide and Lidocaine  ? ?Social History  ? ?Socioeconomic History  ? Marital status: Married  ?  Spouse name: Matthew Ferguson  ? Number of children: 3  ? Years of education: Not on file  ? Highest education level: Not on file  ?Occupational History  ? Occupation: retired  ?Tobacco Use  ? Smoking status: Never  ?  Passive exposure: Past  ? Smokeless tobacco: Never  ?Vaping Use  ? Vaping Use: Never used  ?Substance and Sexual Activity  ? Alcohol use: Never  ? Drug use: Never  ? Sexual activity: Not on file  ?Other Topics Concern  ? Not on file  ?Social History Narrative  ? Not on file  ? ?Social Determinants of Health  ? ?Financial Resource Strain: Not on file  ?Food Insecurity: Not on file  ?Transportation Needs: Not  on file  ?Physical Activity: Not on file  ?Stress: Not on file  ?Social Connections: Not on file  ?  ? ?Family History: ?The patient's family history includes Congenital heart disease in his daughter; Diabetes in his father; Heart disease in his father; Other in his mother; Stroke in his father. There is no history of Colon cancer, Esophageal cancer, Pancreatic cancer, or Stomach cancer. ?ROS:   ?Please see the history of present illness.    ?All other systems reviewed and are negative. ? ?EKGs/Labs/Other Studies Reviewed:   ? ?The following studies were reviewed today: ? ? ?12/31/2021 sodium 134 potassium 4.2 creatinine 1.8 GFR 38 cc ?12/31/2021 hemoglobin 10.8 platelets 118,0001 ?Recent Labs: ?04/02/2021: Magnesium 1.7 ?10/15/2021: ALT 8; BUN 12; Creatinine, Ser 1.60; Hemoglobin 11.3; Platelets 102.0; Potassium 3.8; Sodium 136  ?Recent Lipid Panel ?No results found for: CHOL,  TRIG, HDL, CHOLHDL, VLDL, LDLCALC, LDLDIRECT ? ?Physical Exam:   ? ?VS:  BP 126/60 (BP Location: Left Arm)   Pulse 68   Ht '5\' 9"'$  (1.753 m)   Wt 186 lb (84.4 kg)   SpO2 98%   BMI 27.47 kg/m?    ? ?Wt Readings from Last 3 Encounters:  ?02/11/22 186 lb (84.4 kg)  ?01/02/22 177 lb 4 oz (80.4 kg)  ?01/01/22 175 lb (79.4 kg)  ?  ? ?GEN:  Well nourished, well developed in no acute distress ?HEENT: Normal ?NECK: No JVD; No carotid bruits ?LYMPHATICS: No lymphadenopathy ?CARDIAC: RRR, no murmurs, rubs, gallops ?RESPIRATORY:  Clear to auscultation without rales, wheezing or rhonchi  ?ABDOMEN: Soft, non-tender, non-distended ?MUSCULOSKELETAL:  No edema; No deformity  ?SKIN: Warm and dry ?NEUROLOGIC:  Alert and oriented x 3 ?PSYCHIATRIC:  Normal affect  ? ? ?Signed, ?Matthew More, MD  ?02/11/2022 11:26 AM    ?[place]  ?

## 2022-02-11 NOTE — Patient Instructions (Signed)
Medication Instructions:  ?Your physician has recommended you make the following change in your medication:  ? ?START: Acebutolol 200 mg daily ? ?*If you need a refill on your cardiac medications before your next appointment, please call your pharmacy* ? ? ?Lab Work: ?None ?If you have labs (blood work) drawn today and your tests are completely normal, you will receive your results only by: ?MyChart Message (if you have MyChart) OR ?A paper copy in the mail ?If you have any lab test that is abnormal or we need to change your treatment, we will call you to review the results. ? ? ?Testing/Procedures: ?None ? ? ?Follow-Up: ?At [facility], you and your health needs are our priority.  As part of our continuing mission to provide you with exceptional heart care, we have created designated Provider Care Teams.  These Care Teams include your primary Cardiologist (physician) and Advanced Practice Providers (APPs -  Physician Assistants and Nurse Practitioners) who all work together to provide you with the care you need, when you need it. ? ?We recommend signing up for the patient portal called "MyChart".  Sign up information is provided on this After Visit Summary.  MyChart is used to connect with patients for Virtual Visits (Telemedicine).  Patients are able to view lab/test results, encounter notes, upcoming appointments, etc.  Non-urgent messages can be sent to your provider as well.   ?To learn more about what you can do with MyChart, go to [email or web address].   ? ?Your next appointment:   ?6 month(s) ? ?The format for your next appointment:   ?In Person ? ?Provider:   ?Shirlee More, MD  ? ? ?Other Instructions ?None ? ?Important Information About Sugar ? ? ? ? ? ? ?

## 2022-02-12 NOTE — Telephone Encounter (Signed)
error 

## 2022-02-17 ENCOUNTER — Other Ambulatory Visit: Payer: Self-pay

## 2022-02-17 ENCOUNTER — Telehealth: Payer: Self-pay | Admitting: Gastroenterology

## 2022-02-17 DIAGNOSIS — K7460 Unspecified cirrhosis of liver: Secondary | ICD-10-CM

## 2022-02-17 NOTE — Telephone Encounter (Signed)
He needs to be scheduled for a therapeutic paracentesis this week. ? ?Maximum 5 L fluid removal, administer 25 g of IV albumin in the radiology department. ? ?He should also be given an appointment for another paracentesis 2 to 3 weeks later, as I expect he will need another.  He has been unable to tolerate increased doses of diuretics due to symptomatic hypotension requiring recent medicine changes by his cardiologist. ? ?I will message his nephrologist about this, as I am deferring the management of his diuretics and blood pressure medicine to his cardiologist and nephrologist. ? ?Matthew Ferguson needs a clinic appointment to see me in about 6 weeks. ? ?-HD ?

## 2022-02-17 NOTE — Telephone Encounter (Signed)
Patient has been scheduled for a paracentesis appt on Wednesday, 02/19/22 at 1 pm and Wednesday, 03/12/22 at 1 pm. Pt will need to arrive at [facility] by 12:45 pm for paracentesis appts. Patient has been scheduled for a f/u with Dr. Loletha Carrow on Tuesday, 04/01/22 at 10:40 am. I called and spoke with patient and provided him with his appt information. He knows that Dr. Loletha Carrow is reaching out to his nephrologist. Pt verbalized understanding and had no concerns at the end of the call. ?

## 2022-02-17 NOTE — Telephone Encounter (Signed)
Returned call to patient. He reports bloating and fluid on his abdomen. Pt states that he went from 165 lbs to 180 lbs over the past 2 weeks. Pt states that his abdomen is firm and he has a sharp pain under his belly button, where he had a hernia repair. Pt denies swelling anywhere else. He reports that he is taking Aldactone 25 mg daily. Pt last had a paracentesis on 01/24/22, 3.5 L of fluid removed. Pt states that if he needs another paracentesis appt he can't do Wednesday morning or anything on Thursday. I told pt that I will call him back with recommendations. Pt verbalized understanding. ?

## 2022-02-17 NOTE — Telephone Encounter (Signed)
Inbound call from patient stating that he is having bloating issues with pain. Patient is seeking advice on what he needs to do. Please advise.  ?

## 2022-02-19 ENCOUNTER — Ambulatory Visit (HOSPITAL_COMMUNITY)
Admission: RE | Admit: 2022-02-19 | Discharge: 2022-02-19 | Disposition: A | Discharge status: Home / Self Care | Payer: Medicare Other | Source: Ambulatory Visit | Attending: Gastroenterology | Admitting: Gastroenterology

## 2022-02-19 DIAGNOSIS — R188 Other ascites: Secondary | ICD-10-CM | POA: Diagnosis not present

## 2022-02-19 DIAGNOSIS — K7460 Unspecified cirrhosis of liver: Secondary | ICD-10-CM | POA: Insufficient documentation

## 2022-02-19 HISTORY — PX: IR PARACENTESIS: IMG2679

## 2022-02-19 MED ORDER — LIDOCAINE HCL 1 % IJ SOLN
INTRAMUSCULAR | Status: AC
  Filled 2022-02-19: qty 20

## 2022-02-19 MED ORDER — ALBUMIN HUMAN 25 % IV SOLN
INTRAVENOUS | Status: AC
  Filled 2022-02-19: qty 100

## 2022-02-19 MED ORDER — LIDOCAINE HCL (PF) 1 % IJ SOLN
INTRAMUSCULAR | Status: DC | PRN
  Administered 2022-02-19: 10 mL

## 2022-02-19 MED ORDER — ALBUMIN HUMAN 25 % IV SOLN
25.0000 g | Freq: Once | INTRAVENOUS | Status: AC
  Administered 2022-02-19: 25 g via INTRAVENOUS

## 2022-02-19 NOTE — Progress Notes (Signed)
? ?  Portal Hypertension Clinic Screening Evaluation ? ? ?Indication for evaluation: ?Matthew Ferguson is a 76 y.o. male undergoing preliminary evaluation in the [place] [facility] due to recurrent ascites. ? ?Referring Physician/Established Gastroenterologist:  Wilfrid Lund, MD ? ?Etiology of cirrhosis: Cryptogenic ?Initially diagnosed: 2022 ?# of paracentesis in last month: 2 ?# of paracentesis in last 2 months: 2 ?History of hepatic hydrothorax:  No ?History of hepatic encephalopathy: No ? ?Prior evaluation for liver transplant: No ?History of hepatocellular carcinoma: No ? ?Prior esophagogastroduodenoscopy/intervention: 04/01/21, Dr. Loletha Carrow, no intervention ?Current esophageal varices: Yes, Grade II ?Current gastric varices: No ?History of hematemesis: No ? ?Current diuretic regimen: spironolactone 25 mg,  ?Current pharmacologic encephalopathy prophylaxis/treatment: None ? ?History of renal dysfunction: Yes ?History of hemodialysis: No ? ?History of cardiac dysfunction: Coronary artery disease (PCI 2013) ? ?Other pertinent past medical history: PONV, HTN, HLD, GERD, diabetes ? ? ?Imaging: ?Prior cross sectional imaging of portal system: ?CT AP 08/27/20 ? ?Patent portal system.  Limited evaluation of hepatic veins.  Recanalized paraumbilical vein.  Small splenorenal shunt.  No significant varices. ? ?Echocardiogram: None on file ? ? ?Labs: ?10/15/21-12/31/21 ?Creatinine: 1.8 ?Total Bilirubin: 1.0 ?INR: 1.4 ?Sodium: 134 ?Albumin: 3.1 ? ?Child-Pugh = 8 points, class B ?MELD = 16 (6.0% estimated 3 month mortality) ?[place] Index of Post-TIPS Survival (FIPS) = 1.34 (Overall survival 83.9% at 1 month, 57.7% at 3 months, and 44.9% at 6 months) ? ? ? ?Assessment: ?Matthew Ferguson is a 76 y.o. 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  After preliminary evaluation, this patient  would likely benefit from TIPS creation if large volume ascites persists, and presumed hepatorenal syndrome would likely improve.   ? ?Recommendation: ?Formal consult for TIPS could be considered.  The patient's Gastroenterologist, Dr. Loletha Carrow, will be contacted for further discussion. ? ? ? ?Electronically Signed: ?Suzette Battiest, MD ?02/19/2022, 2:24 PM ? ? ? ?

## 2022-02-19 NOTE — Procedures (Signed)
PROCEDURE SUMMARY: ? ?Successful ultrasound guided paracentesis from the right lower quadrant.  ?Yielded 4.6 L of clear yellow fluid.  ?No immediate complications.  ?The patient tolerated the procedure well.  ? ?Specimen no sent for labs. ? ?EBL < 76m ? ?The patient has required >/=2 paracenteses in a 30 day period and a screening evaluation by the [facility] has been arranged. ? ?Soyla Dryer AGACNP-BC ?[phone]?02/19/2022, 2:02 PM ? ?  ?

## 2022-02-20 ENCOUNTER — Other Ambulatory Visit: Payer: Self-pay | Admitting: Gastroenterology

## 2022-02-20 DIAGNOSIS — R188 Other ascites: Secondary | ICD-10-CM

## 2022-02-24 ENCOUNTER — Other Ambulatory Visit: Payer: Self-pay | Admitting: Interventional Radiology

## 2022-02-24 ENCOUNTER — Ambulatory Visit
Admission: RE | Admit: 2022-02-24 | Discharge: 2022-02-24 | Disposition: A | Discharge status: Home / Self Care | Payer: Medicare Other | Source: Ambulatory Visit | Attending: Gastroenterology | Admitting: Gastroenterology

## 2022-02-24 ENCOUNTER — Encounter: Payer: Self-pay | Admitting: *Deleted

## 2022-02-24 DIAGNOSIS — K7460 Unspecified cirrhosis of liver: Secondary | ICD-10-CM

## 2022-02-24 DIAGNOSIS — K7469 Other cirrhosis of liver: Secondary | ICD-10-CM | POA: Diagnosis not present

## 2022-02-24 DIAGNOSIS — I2510 Atherosclerotic heart disease of native coronary artery without angina pectoris: Secondary | ICD-10-CM

## 2022-02-24 DIAGNOSIS — K766 Portal hypertension: Secondary | ICD-10-CM | POA: Diagnosis not present

## 2022-02-24 DIAGNOSIS — E1369 Other specified diabetes mellitus with other specified complication: Secondary | ICD-10-CM

## 2022-02-24 DIAGNOSIS — R188 Other ascites: Secondary | ICD-10-CM | POA: Diagnosis not present

## 2022-02-24 DIAGNOSIS — Z01818 Encounter for other preprocedural examination: Secondary | ICD-10-CM

## 2022-02-24 HISTORY — PX: IR RADIOLOGIST EVAL & MGMT: IMG5224

## 2022-02-24 NOTE — Consult Note (Signed)
? ?Chief Complaint: ?Patient was seen in consultation today for recurrent ascites ? ?Referring Physician(s): ?Danis,Henry L III ? ?History of Present Illness: ?Matthew Ferguson is a 76 y.o. male with history of crytopgenic cirrhosis presenting via telephone consultation for evaluation of recurrent ascites and possible TIPS creation. ? ?He was initially diagnosed with cryptogenic cirrhosis in 2022, however describes being hospitalizes in [place] in 1969 for what he was told was hepatitis C.  Serology has not detected HCV.  He has required 2 paracenteses over the past month, 3/24 and 4/19, and states that he has gained 4 pounds and is expecting another paracentesis soon.  He describes several other paracenteses at outside facilities prior to transferring care to [facility].  No history of hepatic hydrothorax or encephalopathy.   He has never been evaluated for liver transplant, and never been diagnosed with hepatocellular carcinoma.  He does have a history of grade II esophageal varices visualized on EGD on 04/01/21 by Dr. Loletha Carrow, not requiring intervention.  He mentions being hospitalized in December 2022 for blood in his stool and severely low blood pressure, which didn't require intervention.  No prior episodes of frank hematemesis, however he does occasionally spit up blood when he wakes up in the morning, none in the last 2-3 weeks.  He is currently on 25 mg spironolactone daily, no furosemide.  This dose is limited by his renal function.  He has a history of coronary artery disease status post PCI in 2013.  Other pertinent past medical history includes post operative nausea and vomiting, hypertension, hyperlipidemia, gastroesophageal reflux disorder, and diabetes. ? ? ? ?Past Medical History:  ?Diagnosis Date  ? Ascites   ? Chronic kidney disease   ? Cirrhosis ([place])   ? Complication of anesthesia   ? Post operative vomitting for two weeks   ? Coronary artery disease involving native coronary artery 05/29/2015  ? 05/08/12  had PCI and Xience stent to RCA for Troponin normal unstable angina with residual 40-50% proximal and 30-40 % mid LAD stenosis, EF normal 55-60%  ? Essential hypertension 05/29/2015  ? GERD (gastroesophageal reflux disease)   ? Hepatitis-C 03/31/2021  ? undetectable virus on 03/31/2021.    ? Irritable bowel syndrome   ? Mixed hyperlipidemia 05/29/2015  ? Type 2 diabetes mellitus ([place]) 05/29/2015  ? ? ?Past Surgical History:  ?Procedure Laterality Date  ? BACK SURGERY    ? CARDIAC CATHETERIZATION    ? CHOLECYSTECTOMY    ? COLONOSCOPY WITH PROPOFOL N/A 04/02/2021  ? Procedure: COLONOSCOPY WITH PROPOFOL;  Surgeon: Irene Shipper, MD;  Location: [facility] ENDOSCOPY;  Service: Endoscopy;  Laterality: N/A;  ? CORONARY ANGIOPLASTY WITH STENT PLACEMENT    ? ESOPHAGOGASTRODUODENOSCOPY (EGD) WITH PROPOFOL N/A 04/01/2021  ? Procedure: ESOPHAGOGASTRODUODENOSCOPY (EGD) WITH PROPOFOL;  Surgeon: Doran Stabler, MD;  Location: [place];  Service: Gastroenterology;  Laterality: N/A;  ? INGUINAL HERNIA REPAIR Bilateral 01/2021  ? IR PARACENTESIS  10/17/2021  ? IR PARACENTESIS  01/24/2022  ? IR PARACENTESIS  02/19/2022  ? KNEE SURGERY    ? ? ?Allergies: ?Anesthetics, amide and Lidocaine ? ?Medications: ?Prior to Admission medications   ?Medication Sig Start Date End Date Taking? Authorizing Provider  ?acebutolol (SECTRAL) 200 MG capsule Take 1 capsule (200 mg total) by mouth daily. 02/11/22   Richardo Priest, MD  ?fluticasone (FLONASE) 50 MCG/ACT nasal spray Place 1 spray into both nostrils as needed for congestion. 02/12/21   [provider]  ?FREESTYLE LITE test strip  12/05/21   [provider]  ?isosorbide mononitrate (IMDUR) 30 MG 24 hr tablet Take 1 tablet (30 mg total) by mouth daily. 07/03/21 07/05/27  Richardo Priest, MD  ?Microlet Lancets MISC  11/22/21   [provider]  ?nitroGLYCERIN (NITROSTAT) 0.4 MG SL tablet DISSOLVE 1 TABLET UNDER THE TONGUE EVERY 5 MINUTES FOR UP TO 3 DOSES AS NEEDED FOR CHEST PAIN  04/30/21   Richardo Priest, MD  ?pantoprazole (PROTONIX) 40 MG tablet Take 40 mg by mouth daily.    [provider]  ?sertraline (ZOLOFT) 25 MG tablet Take 25 mg by mouth daily. 10/02/21   [provider]  ?spironolactone (ALDACTONE) 25 MG tablet Take 25 mg by mouth daily at 12 noon. 02/05/22   [provider]  ?  ? ?Family History  ?Problem Relation Age of Onset  ? Other Mother   ?     died with old age  ? Stroke Father   ? Diabetes Father   ? Heart disease Father   ? Congenital heart disease Daughter   ? Colon cancer Neg Hx   ? Esophageal cancer Neg Hx   ? Pancreatic cancer Neg Hx   ? Stomach cancer Neg Hx   ? ? ?Social History  ? ?Socioeconomic History  ? Marital status: Married  ?  Spouse name: Fraser Din  ? Number of children: 3  ? Years of education: Not on file  ? Highest education level: Not on file  ?Occupational History  ? Occupation: retired  ?Tobacco Use  ? Smoking status: Never  ?  Passive exposure: Past  ? Smokeless tobacco: Never  ?Vaping Use  ? Vaping Use: Never used  ?Substance and Sexual Activity  ? Alcohol use: Never  ? Drug use: Never  ? Sexual activity: Not on file  ?Other Topics Concern  ? Not on file  ?Social History Narrative  ? Not on file  ? ?Social Determinants of Health  ? ?Financial Resource Strain: Not on file  ?Food Insecurity: Not on file  ?Transportation Needs: Not on file  ?Physical Activity: Not on file  ?Stress: Not on file  ?Social Connections: Not on file  ? ? ?Review of Systems: A 12 point ROS discussed and pertinent positives are indicated in the HPI above.  All other systems are negative. ? ?Vital Signs: ?There were no vitals taken for this visit. ? ?No physical examination was performed in lieu of virtual telephone clinic visit. ? ? ?Imaging: ?CT AP 08/27/20 ?Patent portal system.  Limited evaluation of hepatic veins.  Recanalized paraumbilical vein.  Small splenorenal shunt.  No significant varices. ?  ?Echocardiogram: None on  file ? ?Labs: ?10/15/21-12/31/21 ?Creatinine: 1.8 ?Total Bilirubin: 1.0 ?INR: 1.4 ?Sodium: 134 ?Albumin: 3.1 ?  ?Child-Pugh = 8 points, class B ?MELD = 16 (6.0% estimated 3 month mortality) ?[place] Index of Post-TIPS Survival (FIPS) = 1.34 (Overall survival 83.9% at 1 month, 57.7% at 3 months, and 44.9% at 6 months) ? ?TUMOR MARKERS: ?Recent Labs  ?  10/15/21 ?1507  ?AFPTM 1.5  ? ? ?Assessment and Plan: ?Matthew Ferguson is a 76 y.o. male with history of cryptogenic cirrhosis (Child Pugh B, MELD 16) with portal hypertension and recurrent ascites in the setting of decreased diuresis limited by renal function.  He would likely benefit from TIPS creation for recurrent ascites and presumed hepatorenal syndrome would likely improve.  ? ?Risks and benefits of TIPS and/or additional variceal embolization were discussed with the patient including, but not limited to, infection, bleeding, damage to adjacent  structures, worsening hepatic and/or cardiac function, worsening and/or the development of altered mental status/encephalopathy, non-target embolization and death.  ? ?All of the patient's questions were answered, patient is agreeable to proceed. ? ? ?-obtain echocardiogram ?-obtain CTA abdomen/pelvis BRTO protocol ?-plan for TIPS creation with paracentesis at [facility] with General Anesthesia ? ? ?Thank you for this interesting consult.  I greatly enjoyed meeting Matthew Ferguson and look forward to participating in their care.  A copy of this report was sent to the requesting provider on this date. ? ?Ruthann Cancer, MD ?Pager: [phone] ?Clinic: [phone] ? ? ? ?I spent a total of  40 Minutes  in telephone clinical consultation, greater than 50% of which was counseling/coordinating care for portal hypertension ? ?

## 2022-02-28 ENCOUNTER — Other Ambulatory Visit (HOSPITAL_COMMUNITY): Payer: Self-pay | Admitting: Interventional Radiology

## 2022-02-28 DIAGNOSIS — K7460 Unspecified cirrhosis of liver: Secondary | ICD-10-CM

## 2022-03-05 ENCOUNTER — Ambulatory Visit (HOSPITAL_COMMUNITY)
Admission: RE | Admit: 2022-03-05 | Discharge: 2022-03-05 | Disposition: A | Discharge status: Home / Self Care | Payer: Medicare Other | Source: Ambulatory Visit | Attending: Gastroenterology | Admitting: Gastroenterology

## 2022-03-05 DIAGNOSIS — R188 Other ascites: Secondary | ICD-10-CM | POA: Insufficient documentation

## 2022-03-05 DIAGNOSIS — K7469 Other cirrhosis of liver: Secondary | ICD-10-CM | POA: Diagnosis not present

## 2022-03-05 DIAGNOSIS — K7460 Unspecified cirrhosis of liver: Secondary | ICD-10-CM | POA: Insufficient documentation

## 2022-03-05 HISTORY — PX: IR PARACENTESIS: IMG2679

## 2022-03-05 MED ORDER — LIDOCAINE HCL (PF) 1 % IJ SOLN
INTRAMUSCULAR | Status: DC | PRN
  Administered 2022-03-05: 5 mL

## 2022-03-05 MED ORDER — LIDOCAINE HCL 1 % IJ SOLN
INTRAMUSCULAR | Status: AC
  Filled 2022-03-05: qty 20

## 2022-03-05 NOTE — Procedures (Signed)
PROCEDURE SUMMARY: ? ?Successful ultrasound guided paracentesis from the right upper quadrant.  ?Yielded 3.7 L of clear yellow fluid.  ?No immediate complications.  ?The patient tolerated the procedure well.  ? ?Specimen was not sent for labs. ? ?EBL < 5 mL ? ?The patient has previously been formally evaluated by the [place] [facility] and is being actively followed for potential future intervention. (Patient scheduled for TIPS placement 03/17/22) ? ?Candiss Norse, PA-C ? ? ?

## 2022-03-07 ENCOUNTER — Ambulatory Visit (HOSPITAL_COMMUNITY)
Admission: RE | Admit: 2022-03-07 | Discharge: 2022-03-07 | Disposition: A | Discharge status: Home / Self Care | Payer: Medicare Other | Source: Ambulatory Visit | Attending: Interventional Radiology | Admitting: Interventional Radiology

## 2022-03-07 DIAGNOSIS — K7460 Unspecified cirrhosis of liver: Secondary | ICD-10-CM | POA: Diagnosis not present

## 2022-03-07 DIAGNOSIS — I745 Embolism and thrombosis of iliac artery: Secondary | ICD-10-CM | POA: Diagnosis not present

## 2022-03-07 DIAGNOSIS — R188 Other ascites: Secondary | ICD-10-CM | POA: Diagnosis not present

## 2022-03-07 DIAGNOSIS — I700 Atherosclerosis of aorta: Secondary | ICD-10-CM | POA: Diagnosis not present

## 2022-03-07 DIAGNOSIS — I708 Atherosclerosis of other arteries: Secondary | ICD-10-CM | POA: Diagnosis not present

## 2022-03-07 LAB — POCT I-STAT CREATININE: Creatinine, Ser: 1.9 mg/dL — ABNORMAL HIGH (ref 0.61–1.24)

## 2022-03-07 MED ORDER — IOHEXOL 350 MG/ML SOLN
100.0000 mL | Freq: Once | INTRAVENOUS | Status: AC | PRN
  Administered 2022-03-07: 80 mL via INTRAVENOUS

## 2022-03-07 MED ORDER — SODIUM CHLORIDE (PF) 0.9 % IJ SOLN
INTRAMUSCULAR | Status: AC
  Filled 2022-03-07: qty 50

## 2022-03-10 ENCOUNTER — Ambulatory Visit (HOSPITAL_COMMUNITY)
Admission: RE | Admit: 2022-03-10 | Discharge: 2022-03-10 | Disposition: A | Discharge status: Home / Self Care | Payer: Medicare Other | Source: Ambulatory Visit | Attending: Interventional Radiology | Admitting: Interventional Radiology

## 2022-03-10 DIAGNOSIS — I119 Hypertensive heart disease without heart failure: Secondary | ICD-10-CM | POA: Diagnosis not present

## 2022-03-10 DIAGNOSIS — Z01818 Encounter for other preprocedural examination: Secondary | ICD-10-CM | POA: Insufficient documentation

## 2022-03-10 DIAGNOSIS — K7460 Unspecified cirrhosis of liver: Secondary | ICD-10-CM | POA: Diagnosis not present

## 2022-03-10 DIAGNOSIS — E119 Type 2 diabetes mellitus without complications: Secondary | ICD-10-CM | POA: Diagnosis not present

## 2022-03-10 DIAGNOSIS — E785 Hyperlipidemia, unspecified: Secondary | ICD-10-CM | POA: Insufficient documentation

## 2022-03-10 DIAGNOSIS — R188 Other ascites: Secondary | ICD-10-CM | POA: Insufficient documentation

## 2022-03-10 DIAGNOSIS — I2510 Atherosclerotic heart disease of native coronary artery without angina pectoris: Secondary | ICD-10-CM | POA: Insufficient documentation

## 2022-03-10 DIAGNOSIS — I081 Rheumatic disorders of both mitral and tricuspid valves: Secondary | ICD-10-CM | POA: Insufficient documentation

## 2022-03-10 LAB — ECHOCARDIOGRAM COMPLETE
Area-P 1/2: 3.72 cm2
Calc EF: 65.2 %
S' Lateral: 2.5 cm
Single Plane A2C EF: 64.2 %
Single Plane A4C EF: 62.3 %

## 2022-03-11 ENCOUNTER — Encounter: Payer: Self-pay | Admitting: Sports Medicine

## 2022-03-11 ENCOUNTER — Ambulatory Visit (INDEPENDENT_AMBULATORY_CARE_PROVIDER_SITE_OTHER): Payer: Medicare Other | Admitting: Sports Medicine

## 2022-03-11 DIAGNOSIS — E1141 Type 2 diabetes mellitus with diabetic mononeuropathy: Secondary | ICD-10-CM

## 2022-03-11 DIAGNOSIS — M79674 Pain in right toe(s): Secondary | ICD-10-CM

## 2022-03-11 DIAGNOSIS — B351 Tinea unguium: Secondary | ICD-10-CM | POA: Diagnosis not present

## 2022-03-11 DIAGNOSIS — M2141 Flat foot [pes planus] (acquired), right foot: Secondary | ICD-10-CM

## 2022-03-11 DIAGNOSIS — M2142 Flat foot [pes planus] (acquired), left foot: Secondary | ICD-10-CM

## 2022-03-11 DIAGNOSIS — M2040 Other hammer toe(s) (acquired), unspecified foot: Secondary | ICD-10-CM

## 2022-03-11 DIAGNOSIS — M79675 Pain in left toe(s): Secondary | ICD-10-CM

## 2022-03-11 MED ORDER — NEOMYCIN-POLYMYXIN-HC 3.5-10000-1 OT SOLN: OTIC | 0 refills | Status: DC

## 2022-03-11 NOTE — Progress Notes (Signed)
Surgical Instructions ? ? ? Your procedure is scheduled on Monday, May 15th, 2023. ? ? Report to [facility] Main Entrance "A" at 06:00 A.M., then check in with the Admitting office. ? Call this number if you have problems the morning of surgery: ? [phone] ? ? If you have any questions prior to your surgery date call [phone]: Open Monday-Friday 8am-4pm ? ? ? Remember: ? Do not eat after midnight the night before your surgery ? ?You may drink clear liquids until 05:00 the morning of your surgery.   ?Clear liquids allowed are: Water, Non-Citrus Juices (without pulp), Carbonated Beverages, Clear Tea, Black Coffee ONLY (NO MILK, CREAM OR POWDERED CREAMER of any kind), and Gatorade ?  ? Take these medicines the morning of surgery with A SIP OF WATER:  ? ?pantoprazole (PROTONIX) ?sertraline (ZOLOFT) ? ?If needed: ? ?fluticasone [facility])  ?nitroGLYCERIN (NITROSTAT) ? ?As of today, STOP taking any Aspirin (unless otherwise instructed by your surgeon) Aleve, Naproxen, Ibuprofen, Motrin, Advil, Goody's, BC's, all herbal medications, fish oil, and all vitamins. ? ? ?HOW TO MANAGE YOUR DIABETES ?BEFORE AND AFTER SURGERY ? ?Why is it important to control my blood sugar before and after surgery? ?Improving blood sugar levels before and after surgery helps healing and can limit problems. ?A way of improving blood sugar control is eating a healthy diet by: ? Eating less sugar and carbohydrates ? Increasing activity/exercise ? Talking with your doctor about reaching your blood sugar goals ?High blood sugars (greater than 180 mg/dL) can raise your risk of infections and slow your recovery, so you will need to focus on controlling your diabetes during the weeks before surgery. ?Make sure that the doctor who takes care of your diabetes knows about your planned surgery including the date and location. ? ?How do I manage my blood sugar before surgery? ?Check your blood sugar at least 4 times a day, starting 2 days before  surgery, to make sure that the level is not too high or low. ? ?Check your blood sugar the morning of your surgery when you wake up and every 2 hours until you get to the Short Stay unit. ? ?If your blood sugar is less than 70 mg/dL, you will need to treat for low blood sugar: ?Do not take insulin. ?Treat a low blood sugar (less than 70 mg/dL) with ? cup of clear juice (cranberry or apple), 4 glucose tablets, OR glucose gel. ?Recheck blood sugar in 15 minutes after treatment (to make sure it is greater than 70 mg/dL). If your blood sugar is not greater than 70 mg/dL on recheck, call [phone] for further instructions. ?Report your blood sugar to the short stay nurse when you get to Short Stay. ? ?If you are admitted to the hospital after surgery: ?Your blood sugar will be checked by the staff and you will probably be given insulin after surgery (instead of oral diabetes medicines) to make sure you have good blood sugar levels. ?The goal for blood sugar control after surgery is 80-180 mg/dL.  ? ? ? The day of surgery: ?         ?Do not wear jewelry  ?Do not wear lotions, powders, colognes, or deodorant. ?Men may shave face and neck. ?Do not bring valuables to the hospital. ? ? ?[facility] is not responsible for any belongings or valuables. .  ? ?Do NOT Smoke (Tobacco/Vaping)  24 hours prior to your procedure ? ?If you use a CPAP at night, you may bring your mask  for your overnight stay. ?  ?Contacts, glasses, hearing aids, dentures or partials may not be worn into surgery, please bring cases for these belongings ?  ?For patients admitted to the hospital, discharge time will be determined by your treatment team. ?  ?Patients discharged the day of surgery will not be allowed to drive home, and someone needs to stay with them for 24 hours. ? ? ?SURGICAL WAITING ROOM VISITATION ?Patients having surgery or a procedure in a hospital may have two support people. ?Children under the age of 83 must have an adult with them  who is not the patient. ?They may stay in the waiting area during the procedure and may switch out with other visitors. If the patient needs to stay at the hospital during part of their recovery, the visitor guidelines for inpatient rooms apply. ? ?Please refer to the [facility] website for the visitor guidelines for Inpatients (after your surgery is over and you are in a regular room).  ? ? ?Special instructions:   ? ?Oral Hygiene is also important to reduce your risk of infection.  Remember - BRUSH YOUR TEETH THE MORNING OF SURGERY WITH YOUR REGULAR TOOTHPASTE ? ? ?[facility]- Preparing For Surgery ? ?Before surgery, you can play an important role. Because skin is not sterile, your skin needs to be as free of germs as possible. You can reduce the number of germs on your skin by washing with CHG (chlorahexidine gluconate) Soap before surgery.  CHG is an antiseptic cleaner which kills germs and bonds with the skin to continue killing germs even after washing.   ? ? ?Please do not use if you have an allergy to CHG or antibacterial soaps. If your skin becomes reddened/irritated stop using the CHG.  ?Do not shave (including legs and underarms) for at least 48 hours prior to first CHG shower. It is OK to shave your face. ? ?Please follow these instructions carefully. ?  ? ? Shower the NIGHT BEFORE SURGERY and the MORNING OF SURGERY with CHG Soap.  ? If you chose to wash your hair, wash your hair first as usual with your normal shampoo. After you shampoo, rinse your hair and body thoroughly to remove the shampoo.  Then [employer] and genitals (private parts) with your normal soap and rinse thoroughly to remove soap. ? ?After that Use CHG Soap as you would any other liquid soap. You can apply CHG directly to the skin and wash gently with a scrungie or a clean washcloth.  ? ?Apply the CHG Soap to your body ONLY FROM THE NECK DOWN.  Do not use on open wounds or open sores. Avoid contact with your eyes, ears, mouth and  genitals (private parts). Wash Face and genitals (private parts)  with your normal soap.  ? ?Wash thoroughly, paying special attention to the area where your surgery will be performed. ? ?Thoroughly rinse your body with warm water from the neck down. ? ?DO NOT shower/wash with your normal soap after using and rinsing off the CHG Soap. ? ?Pat yourself dry with a CLEAN TOWEL. ? ?Wear CLEAN PAJAMAS to bed the night before surgery ? ?Place CLEAN SHEETS on your bed the night before your surgery ? ?DO NOT SLEEP WITH PETS. ? ? ?Day of Surgery: ? ?Take a shower with CHG soap. ?Wear Clean/Comfortable clothing the morning of surgery ?Do not apply any deodorants/lotions.   ?Remember to brush your teeth WITH YOUR REGULAR TOOTHPASTE. ? ? ? ?If you received a COVID test  during your pre-op visit, it is requested that you wear a mask when out in public, stay away from anyone that may not be feeling well, and notify your surgeon if you develop symptoms. If you have been in contact with anyone that has tested positive in the last 10 days, please notify your surgeon. ? ?  ?Please read over the following fact sheets that you were given.   ?

## 2022-03-11 NOTE — Progress Notes (Signed)
Subjective: ?Matthew Ferguson is a 76 y.o. male patient with history of diabetes who presents to office today complaining of long,mildly painful nails  while ambulating in shoes; unable to trim.  Patient reports that he has some soreness at the right greater than left big toenail states that he recently filed his toenails and a small piece of the nail broke off on the right great toe that has been really sore.  Patient reports that he has been very stressed helping to care for his wife who had a fall and broke her neck at [employer].  Patient denies any other pedal complaints at this time. ? ?Fasting blood sugar not recorded but per patient has been more elevated ?A1c 6.7 ?Last visit to PCP Dr. Reesa Chew was 2 weeks ago ?Patient Active Problem List  ? Diagnosis Date Noted  ? Irritable bowel syndrome 12/30/2021  ? GERD (gastroesophageal reflux disease) 12/30/2021  ? Complication of anesthesia 12/30/2021  ? Cirrhosis ([place]) 12/30/2021  ? Chronic kidney disease 12/30/2021  ? Ascites 12/30/2021  ? Chronic back pain 12/11/2021  ? Depression 12/11/2021  ? Hearing loss 12/11/2021  ? Sensorineural hearing loss, bilateral 12/11/2021  ? Type 2 diabetes mellitus with diabetic neuropathy, unspecified ([place]) 12/11/2021  ? Rectal bleeding   ? Diverticulosis of colon without hemorrhage   ? Acute blood loss anemia 04/01/2021  ? Symptomatic anemia 03/31/2021  ? Hepatitis-C 03/31/2021  ? Pre-op evaluation 09/09/2019  ? S/P left knee arthroscopy 08/10/2018  ? Coronary artery disease involving native coronary artery 05/29/2015  ? Essential hypertension 05/29/2015  ? Mixed hyperlipidemia 05/29/2015  ? Type 2 diabetes mellitus ([place]) 05/29/2015  ? ?Current Outpatient Medications on File Prior to Visit  ?Medication Sig Dispense Refill  ? acebutolol (SECTRAL) 200 MG capsule Take 1 capsule (200 mg total) by mouth daily. (Patient taking differently: Take 200 mg by mouth every evening.) 90 capsule 3  ? aMILoride (MIDAMOR) 5 MG tablet Take 5 mg by mouth  in the morning.    ? fluticasone (FLONASE) 50 MCG/ACT nasal spray Place 1 spray into both nostrils as needed for congestion.    ? isosorbide mononitrate (IMDUR) 30 MG 24 hr tablet Take 1 tablet (30 mg total) by mouth daily. (Patient taking differently: Take 30 mg by mouth every evening.) 90 tablet 3  ? Microlet Lancets MISC     ? nitroGLYCERIN (NITROSTAT) 0.4 MG SL tablet DISSOLVE 1 TABLET UNDER THE TONGUE EVERY 5 MINUTES FOR UP TO 3 DOSES AS NEEDED FOR CHEST PAIN 25 tablet 7  ? pantoprazole (PROTONIX) 40 MG tablet Take 40 mg by mouth in the morning.    ? sertraline (ZOLOFT) 25 MG tablet Take 25 mg by mouth in the morning.    ? spironolactone (ALDACTONE) 25 MG tablet Take 12.5 mg by mouth in the morning.    ? ?Current Facility-Administered Medications on File Prior to Visit  ?Medication Dose Route Frequency Provider Last Rate Last Admin  ? albumin human 25 % solution 25 g  25 g Intravenous Once Doran Stabler, MD      ? ?Allergies  ?Allergen Reactions  ? Anesthetics, Amide Other (See Comments)  ?  Anesthesia-nausea/vomiting/"I turn green"  ? ? ? ? ?Objective: ?General: Patient is awake, alert, and oriented x 3 and in no acute distress. ? ?Integument: Skin is warm, dry and supple bilateral. Nails are minimally elongated thickened and dystrophic with subungual debris, consistent with onychomycosis, 1-5 bilateral.  There is mild incurvation noted at the right hallux medial  greater than left hallux medial nail border with a small portion of the nail that appears to be snagged or broken off there is some significant soreness to this nail fold as well as blanchable erythema no active drainage no malodor no advancing signs of infection everything appears to be localized to the right greater than left hallux medial border. No open lesions or preulcerative lesions present bilateral. Remaining integument unremarkable. ? ?Vasculature:  Dorsalis Pedis pulse 1/4 bilateral. Posterior Tibial pulse  1/4 bilateral. Capillary  fill time <3 sec 1-5 bilateral. Positive hair growth to the level of the digits.Temperature gradient within normal limits. Minimal varicosities present bilateral. No edema present bilateral.  ? ?Neurology: Protective sensation present bilateral.  Unchanged occasional burning pain to the dorsum of the of the feet likely consistent with neuritis secondary to diabetes. ? ?Musculoskeletal: There is mild pain to palpation to the right>left hallux medial nail fold.  Asymptomatic pedal hammertoe and pes planus deformities noted bilateral. Muscular strength 5/5 in all lower extremity muscular groups bilateral. ? ?Assessment and Plan: ?Problem List Items Addressed This Visit   ?None ?Visit Diagnoses   ? ? Pain due to onychomycosis of toenails of both feet    -  Primary  ? Neuritis due to diabetes mellitus ([place])      ? Pes planus of both feet      ? Hammer toe, unspecified laterality      ? ?  ? ?-Examined patient. ?-Re-Discussed and educated patient on diabetic foot care and pain around toenail right greater than left ?-Mechanically debrided all nails 1-5 bilateral and removed all offending borders at the right and left hallux medial nail folds using sterile nail nipper and filed with dremel without incident.  There was no acute drainage or other signs or symptoms of infection at the nail folds however due to pain and localized redness I did prescribe Corticosporin solution for patient to use once daily after bath or shower at the medial nail fold of the left and right hallux ?-Advised patient if toe nail issue fails to continue to improve to return to office sooner ?-Continue with diabetic shoes and socks without icing that do not rub the toes ?-Answered all patient questions ?-Patient to return  in 3 months for at risk foot care or sooner if problems or issues arise. ? ?Landis Martins, DPM ?

## 2022-03-12 ENCOUNTER — Other Ambulatory Visit: Payer: Self-pay

## 2022-03-12 ENCOUNTER — Encounter (HOSPITAL_COMMUNITY): Payer: Self-pay

## 2022-03-12 ENCOUNTER — Encounter (HOSPITAL_COMMUNITY)
Admission: RE | Admit: 2022-03-12 | Discharge: 2022-03-12 | Disposition: A | Discharge status: Home / Self Care | Payer: Medicare Other | Source: Ambulatory Visit | Attending: Interventional Radiology | Admitting: Interventional Radiology

## 2022-03-12 VITALS — BP 116/67 | HR 62 | Temp 98.2°F | Resp 18 | Ht 69.0 in | Wt 188.6 lb

## 2022-03-12 DIAGNOSIS — E1140 Type 2 diabetes mellitus with diabetic neuropathy, unspecified: Secondary | ICD-10-CM | POA: Insufficient documentation

## 2022-03-12 DIAGNOSIS — B182 Chronic viral hepatitis C: Secondary | ICD-10-CM | POA: Insufficient documentation

## 2022-03-12 DIAGNOSIS — K7460 Unspecified cirrhosis of liver: Secondary | ICD-10-CM | POA: Diagnosis not present

## 2022-03-12 DIAGNOSIS — Z01812 Encounter for preprocedural laboratory examination: Secondary | ICD-10-CM | POA: Diagnosis not present

## 2022-03-12 DIAGNOSIS — Z955 Presence of coronary angioplasty implant and graft: Secondary | ICD-10-CM | POA: Diagnosis not present

## 2022-03-12 DIAGNOSIS — E1122 Type 2 diabetes mellitus with diabetic chronic kidney disease: Secondary | ICD-10-CM | POA: Diagnosis not present

## 2022-03-12 DIAGNOSIS — I2510 Atherosclerotic heart disease of native coronary artery without angina pectoris: Secondary | ICD-10-CM | POA: Insufficient documentation

## 2022-03-12 DIAGNOSIS — K7210 Chronic hepatic failure without coma: Secondary | ICD-10-CM | POA: Insufficient documentation

## 2022-03-12 DIAGNOSIS — I129 Hypertensive chronic kidney disease with stage 1 through stage 4 chronic kidney disease, or unspecified chronic kidney disease: Secondary | ICD-10-CM | POA: Diagnosis not present

## 2022-03-12 DIAGNOSIS — Z01818 Encounter for other preprocedural examination: Secondary | ICD-10-CM

## 2022-03-12 HISTORY — DX: Tinnitus, unspecified ear: H93.19

## 2022-03-12 HISTORY — DX: Palpitations: R00.2

## 2022-03-12 HISTORY — DX: Depression, unspecified: F32.A

## 2022-03-12 HISTORY — DX: Nausea with vomiting, unspecified: Z98.890

## 2022-03-12 HISTORY — DX: Nausea with vomiting, unspecified: R11.2

## 2022-03-12 LAB — COMPREHENSIVE METABOLIC PANEL
ALT: 12 U/L (ref 0–44)
AST: 20 U/L (ref 15–41)
Albumin: 2.8 g/dL — ABNORMAL LOW (ref 3.5–5.0)
Alkaline Phosphatase: 92 U/L (ref 38–126)
Anion gap: 5 (ref 5–15)
BUN: 16 mg/dL (ref 8–23)
CO2: 22 mmol/L (ref 22–32)
Calcium: 8.6 mg/dL — ABNORMAL LOW (ref 8.9–10.3)
Chloride: 110 mmol/L (ref 98–111)
Creatinine, Ser: 1.79 mg/dL — ABNORMAL HIGH (ref 0.61–1.24)
GFR, Estimated: 39 mL/min — ABNORMAL LOW (ref >60)
Glucose, Bld: 212 mg/dL — ABNORMAL HIGH (ref 70–99)
Potassium: 4.1 mmol/L (ref 3.5–5.1)
Sodium: 137 mmol/L (ref 135–145)
Total Bilirubin: 0.8 mg/dL (ref 0.3–1.2)
Total Protein: 6.2 g/dL — ABNORMAL LOW (ref 6.5–8.1)

## 2022-03-12 LAB — CBC
HCT: 28.3 % — ABNORMAL LOW (ref 39.0–52.0)
Hemoglobin: 9.3 g/dL — ABNORMAL LOW (ref 13.0–17.0)
MCH: 32.9 pg (ref 26.0–34.0)
MCHC: 32.9 g/dL (ref 30.0–36.0)
MCV: 100 fL (ref 80.0–100.0)
Platelets: 134 10*3/uL — ABNORMAL LOW (ref 150–400)
RBC: 2.83 MIL/uL — ABNORMAL LOW (ref 4.22–5.81)
RDW: 12.9 % (ref 11.5–15.5)
WBC: 6.5 10*3/uL (ref 4.0–10.5)
nRBC: 0 % (ref 0.0–0.2)

## 2022-03-12 LAB — HEMOGLOBIN A1C
Hgb A1c MFr Bld: 7.3 % — ABNORMAL HIGH (ref 4.8–5.6)
Mean Plasma Glucose: 162.81 mg/dL

## 2022-03-12 LAB — GLUCOSE, CAPILLARY: Glucose-Capillary: 240 mg/dL — ABNORMAL HIGH (ref 70–99)

## 2022-03-12 LAB — TYPE AND SCREEN
ABO/RH(D): O POS
Antibody Screen: NEGATIVE

## 2022-03-12 NOTE — Progress Notes (Signed)
PCP - Dr. Garwin Brothers ?Cardiologist - Dr. Shirlee More ? ?PPM/ICD - denies ? ? ?Chest x-ray - 12/31/21 ?EKG - 12/31/21, tracing requested ?Stress Test - 02/09/20 ?ECHO - 03/10/22 ?Cardiac Cath - 05/08/12 ? ?Sleep Study - denies ? ?DM- Type 2 ?Fasting Blood Sugar - 150-200 ?Checks Blood Sugar once a day ( around 2-3 AM per pt) ? ?ASA/Blood Thinner Instructions: n/a ? ? ?ERAS Protcol - yes, no drink ? ? ?COVID TEST- n/a ? ? ?Anesthesia review: yes, cardiac hx ? ?Patient denies shortness of breath, fever, cough and chest pain at PAT appointment ? ? ?All instructions explained to the patient, with a verbal understanding of the material. Patient agrees to go over the instructions while at home for a better understanding. Patient also instructed to notify surgeon of any contact with COVID+ person or if he develops any symptoms. The opportunity to ask questions was provided. ?  ?

## 2022-03-13 ENCOUNTER — Other Ambulatory Visit: Payer: Self-pay | Admitting: Radiology

## 2022-03-13 DIAGNOSIS — K7460 Unspecified cirrhosis of liver: Secondary | ICD-10-CM

## 2022-03-13 DIAGNOSIS — J9811 Atelectasis: Secondary | ICD-10-CM | POA: Diagnosis not present

## 2022-03-13 DIAGNOSIS — R079 Chest pain, unspecified: Secondary | ICD-10-CM | POA: Insufficient documentation

## 2022-03-13 DIAGNOSIS — Z79899 Other long term (current) drug therapy: Secondary | ICD-10-CM | POA: Diagnosis not present

## 2022-03-13 DIAGNOSIS — E1122 Type 2 diabetes mellitus with diabetic chronic kidney disease: Secondary | ICD-10-CM | POA: Diagnosis not present

## 2022-03-13 DIAGNOSIS — K766 Portal hypertension: Secondary | ICD-10-CM | POA: Diagnosis not present

## 2022-03-13 DIAGNOSIS — E785 Hyperlipidemia, unspecified: Secondary | ICD-10-CM | POA: Diagnosis not present

## 2022-03-13 DIAGNOSIS — N183 Chronic kidney disease, stage 3 unspecified: Secondary | ICD-10-CM | POA: Diagnosis not present

## 2022-03-13 DIAGNOSIS — Z7982 Long term (current) use of aspirin: Secondary | ICD-10-CM | POA: Diagnosis not present

## 2022-03-13 DIAGNOSIS — I25110 Atherosclerotic heart disease of native coronary artery with unstable angina pectoris: Secondary | ICD-10-CM | POA: Diagnosis not present

## 2022-03-13 DIAGNOSIS — I129 Hypertensive chronic kidney disease with stage 1 through stage 4 chronic kidney disease, or unspecified chronic kidney disease: Secondary | ICD-10-CM | POA: Diagnosis not present

## 2022-03-13 DIAGNOSIS — R0789 Other chest pain: Secondary | ICD-10-CM | POA: Diagnosis not present

## 2022-03-13 DIAGNOSIS — Z9049 Acquired absence of other specified parts of digestive tract: Secondary | ICD-10-CM | POA: Diagnosis not present

## 2022-03-13 DIAGNOSIS — R001 Bradycardia, unspecified: Secondary | ICD-10-CM | POA: Diagnosis not present

## 2022-03-13 DIAGNOSIS — B1920 Unspecified viral hepatitis C without hepatic coma: Secondary | ICD-10-CM | POA: Diagnosis not present

## 2022-03-13 DIAGNOSIS — I25119 Atherosclerotic heart disease of native coronary artery with unspecified angina pectoris: Secondary | ICD-10-CM | POA: Diagnosis not present

## 2022-03-13 DIAGNOSIS — I959 Hypotension, unspecified: Secondary | ICD-10-CM | POA: Diagnosis not present

## 2022-03-13 HISTORY — DX: Chest pain, unspecified: R07.9

## 2022-03-13 NOTE — Anesthesia Preprocedure Evaluation (Addendum)
Anesthesia Evaluation  ?Patient identified by MRN, date of birth, ID band ?Patient awake ? ? ? ?Reviewed: ?Allergy & Precautions, NPO status , Patient's Chart, lab work & pertinent test results ? ?History of Anesthesia Complications ?(+) PONV and history of anesthetic complications ? ?Airway ?Mallampati: II ? ? ? ? ? ? Dental ?no notable dental hx. ? ?  ?Pulmonary ?neg pulmonary ROS,  ?  ?Pulmonary exam normal ? ? ? ? ? ? ? Cardiovascular ?hypertension, + CAD and + Cardiac Stents  ? ?Rhythm:Regular Rate:Bradycardia ? ? ?  ?Neuro/Psych ?PSYCHIATRIC DISORDERS Depression   ? GI/Hepatic ?GERD  Medicated and Controlled,  ?Endo/Other  ?diabetes ? Renal/GU ?Renal InsufficiencyRenal disease  ?negative genitourinary ?  ?Musculoskeletal ?negative musculoskeletal ROS ?(+)  ? Abdominal ?Normal abdominal exam  (+)   ?Peds ? Hematology ? ?(+) Blood dyscrasia, anemia ,   ?Anesthesia Other Findings ? ? ??1. Left ventricular ejection fraction, by estimation, is 65 to 70%. The  ?left ventricle has normal function. The left ventricle has no regional  ?wall motion abnormalities. There is mild concentric left ventricular  ?hypertrophy. Left ventricular diastolic  ?parameters are consistent with Grade II diastolic dysfunction  ?(pseudonormalization).  ??2. Right ventricular systolic function is normal. The right ventricular  ?size is normal. Tricuspid regurgitation signal is inadequate for assessing  ?PA pressure.  ??3. Left atrial size was mildly dilated.  ??4. The mitral valve is normal in structure. Trivial mitral valve  ?regurgitation. No evidence of mitral stenosis.  ??5. The aortic valve is tricuspid. Aortic valve regurgitation is not  ?visualized. Aortic valve sclerosis/calcification is present, without any  ?evidence of aortic stenosis.  ??6. Aortic dilatation noted. There is mild dilatation of the aortic root,  ?measuring 38 mm.  ? ?FINDINGS ? Reproductive/Obstetrics ? ?   ? ? ? ? ? ? ? ? ? ? ? ? ? ?  ?  ? ? ? ? ? ?Anesthesia Physical ?Anesthesia Plan ? ?ASA: 3 ? ?Anesthesia Plan: General  ? ?Post-op Pain Management:   ? ?Induction: Intravenous ? ?PONV Risk Score and Plan: 4 or greater and Ondansetron and Midazolam ? ?Airway Management Planned: Oral ETT ? ?Additional Equipment: Arterial line ? ?Intra-op Plan:  ? ?Post-operative Plan: Extubation in OR ? ?Informed Consent: I have reviewed the patients History and Physical, chart, labs and discussed the procedure including the risks, benefits and alternatives for the proposed anesthesia with the patient or authorized representative who has indicated his/her understanding and acceptance.  ? ? ? ?Dental advisory given ? ?Plan Discussed with: CRNA ? ?Anesthesia Plan Comments: (See APP note by Durel Salts, FNP  ?2 PIV and A-line)  ? ? ? ? ?Anesthesia Quick Evaluation ? ?

## 2022-03-13 NOTE — Progress Notes (Signed)
Anesthesia Chart Review: ? ? Case: [id number] Date/Time: 03/17/22 0745  ? Procedure: TIPS  ? Anesthesia type: General  ? Pre-op diagnosis: Cirrhosis (K74.60)  ? Location: [facility] ROOM 12 / [facility]  ? Surgeons: Suzette Battiest, MD  ? ?  ? ? ?DISCUSSION: ?Pt is 76 years old with hx CAD (s/p PCI/DES to RCA), HTN, DM, CKD, end stage liver disease, possible remote hx hepatitis C (not detectable 03/31/21), esophageal varices, anemia, hx GI bleed ? ?[facility] visit to [facility] 12/31/21 for chest pain. EKG stable, troponin negative x2 ? ?Hgb 9.3 and Cr 1.79 lab results are consistent with prior ? ? ?VS: BP 116/67   Pulse 62   Temp 36.8 ?C (Oral)   Resp 18   Ht '5\' 9"'$  (1.753 m)   Wt 85.5 kg   SpO2 100%   BMI 27.85 kg/m?  ? ?PROVIDERS: ?- PCP is Garwin Brothers, MD ?- Cardiologist is Shirlee More, MD. Last office visit 02/11/22 ? ? ?LABS:  ?- glucose 212, A1c 7.3 ?- Cr 1.79. Hx CKD. This is improved from prior results. Baseline Cr appears to be ~1.8 ?- Hgb 9.3. Appears to be consistent with prior results/anemia hx.  ? ?(all labs ordered are listed, but only abnormal results are displayed) ? ?Labs Reviewed  ?GLUCOSE, CAPILLARY - Abnormal; Notable for the following components:  ?    Result Value  ? Glucose-Capillary 240 (*)   ? All other components within normal limits  ?HEMOGLOBIN A1C - Abnormal; Notable for the following components:  ? Hgb A1c MFr Bld 7.3 (*)   ? All other components within normal limits  ?COMPREHENSIVE METABOLIC PANEL - Abnormal; Notable for the following components:  ? Glucose, Bld 212 (*)   ? Creatinine, Ser 1.79 (*)   ? Calcium 8.6 (*)   ? Total Protein 6.2 (*)   ? Albumin 2.8 (*)   ? GFR, Estimated 39 (*)   ? All other components within normal limits  ?CBC - Abnormal; Notable for the following components:  ? RBC 2.83 (*)   ? Hemoglobin 9.3 (*)   ? HCT 28.3 (*)   ? Platelets 134 (*)   ? All other components within normal limits  ?TYPE AND SCREEN  ? ? ? ?IMAGES: ?CT angio abd pelvis 03/07/22:  ?- Cirrhosis and ascites  again demonstrated. ?- Portal system patent, with recanalization of the umbilical vein. ?- Aortic Atherosclerosis (ICD10-I70.0). ?- Additional ancillary findings as above. ? ?CXR 12/31/21 (care everywhere):  ?- No active cardiopulmonary disease ? ? ?EKG 12/31/21:  per report in care everywhere, showed sinus bradycardia. Copy of tracing requested.  ? ? ?CV: ?Echo 03/10/22:  ?1. Left ventricular ejection fraction, by estimation, is 65 to 70%. The left ventricle has normal function. The left ventricle has no regional wall motion abnormalities. There is mild concentric left ventricular hypertrophy. Left ventricular diastolic parameters are consistent with Grade II diastolic dysfunction (pseudonormalization).  ?2. Right ventricular systolic function is normal. The right ventricular  ?size is normal. Tricuspid regurgitation signal is inadequate for assessing PA pressure.  ?3. Left atrial size was mildly dilated.  ?4. The mitral valve is normal in structure. Trivial mitral valve regurgitation. No evidence of mitral stenosis.  ?5. The aortic valve is tricuspid. Aortic valve regurgitation is not visualized. Aortic valve sclerosis/calcification is present, without any evidence of aortic stenosis.  ?6. Aortic dilatation noted. There is mild dilatation of the aortic root, measuring 38 mm.  ? ? ?Cardiac monitor 01/21/22:  ?- Patient  had a min HR of 44 bpm, max HR of 203 bpm, and avg HR of 67 bpm. Predominant underlying rhythm was Sinus Rhythm.  ?- Isolated SVEs were occasional (1.3%, 17727), SVE Couplets were rare (<1.0%, 41), and SVE Triplets were rare (<1.0%, 5). ?- Isolated VEs were rare (<1.0%, 591), VE Couplets were rare (<1.0%, 9), and VE Triplets were rare (<1.0%, 1). Ventricular Bigeminy and Trigeminy were present. 1 run of Ventricular Tachycardia occurred lasting 6 beats with a max rate of 203 bpm (avg 120 bpm).  There were no episodes of atrial fibrillation or flutter. ?- There were 10 triggered and 8 diary events. ?4  episodes were associated with frequent PVCs and bigeminy ?2 episodes occurred frequent APCs ?The episodes of SVT were asymptomatic ? ? ?Nuclear stress test 02/09/20:  ?The left ventricular ejection fraction is hyperdynamic (>65%). ?Nuclear stress EF: 68%. ?There was no ST segment deviation noted during stress. ?This is a low risk study. ?No evidence of ischemia or MI. ?Normal EF. ? ? ? ?Past Medical History:  ?Diagnosis Date  ? Ascites   ? Chronic kidney disease   ? Cirrhosis ([place])   ? Complication of anesthesia   ? Post operative vomiting for "2 weeks" per pt after cholecystectomy  ? Coronary artery disease involving native coronary artery 05/29/2015  ? 05/08/12 had PCI and Xience stent to RCA for Troponin normal unstable angina with residual 40-50% proximal and 30-40 % mid LAD stenosis, EF normal 55-60%  ? Depression   ? Essential hypertension 05/29/2015  ? GERD (gastroesophageal reflux disease)   ? Hepatitis-C 03/31/2021  ? undetectable virus on 03/31/2021.    ? Irritable bowel syndrome   ? Mixed hyperlipidemia 05/29/2015  ? Palpitations   ? PONV (postoperative nausea and vomiting)   ? Tinnitus   ? Type 2 diabetes mellitus ([place]) 05/29/2015  ? ? ?Past Surgical History:  ?Procedure Laterality Date  ? BACK SURGERY    ? CARDIAC CATHETERIZATION    ? CHOLECYSTECTOMY    ? COLONOSCOPY WITH PROPOFOL N/A 04/02/2021  ? Procedure: COLONOSCOPY WITH PROPOFOL;  Surgeon: Irene Shipper, MD;  [facility] END[facility]COPY;  Service: Endoscopy;  Laterality: N/A;  ? CORONARY ANGIOPLASTY WITH STENT PLACEMENT    ? ESOPHAGOGASTRODUODENOSCOPY (EGD) WITH PROPOFOL N/A 04/01/2021  ? Procedure: ESOPHAGOGASTRODUODENOSCOPY (EGD) WITH PROPOFOL;  Surgeon: Doran Stabler, MD;  Location: [facility];  Service: Gastroenterology;  Laterality: N/A;  ? INGUINAL HERNIA REPAIR Bilateral 01/2021  ? IR PARACENTESIS  10/17/2021  ? IR PARACENTESIS  01/24/2022  ? IR PARACENTESIS  02/19/2022  ? IR PARACENTESIS  03/05/2022  ? IR RADIOLOGIST EVAL & MGMT  02/24/2022  ?  KNEE SURGERY    ? ? ?MEDICATIONS: ? acebutolol (SECTRAL) 200 MG capsule  ? aMILoride (MIDAMOR) 5 MG tablet  ? fluticasone (FLONASE) 50 MCG/ACT nasal spray  ? isosorbide mononitrate (IMDUR) 30 MG 24 hr tablet  ? Microlet Lancets MISC  ? neomycin-polymyxin-hydrocortisone (CORTISPORIN) OTIC solution  ? nitroGLYCERIN (NITROSTAT) 0.4 MG SL tablet  ? pantoprazole (PROTONIX) 40 MG tablet  ? sertraline (ZOLOFT) 25 MG tablet  ? spironolactone (ALDACTONE) 25 MG tablet  ? ? albumin human 25 % solution 25 g  ? ? ?If no changes, I anticipate pt can proceed with surgery as scheduled.  ? ?Willeen Cass, PhD, FNP-BC ?[facility] Short Stay [facility]/Anesthesiology ?Phone: [phone] ?03/13/2022 9:59 AM ? ? ? ? ? ? ?

## 2022-03-14 ENCOUNTER — Other Ambulatory Visit (HOSPITAL_COMMUNITY): Payer: Self-pay | Admitting: Physician Assistant

## 2022-03-17 ENCOUNTER — Inpatient Hospital Stay (HOSPITAL_COMMUNITY)
Admission: RE | Admit: 2022-03-17 | Discharge: 2022-03-19 | DRG: 406 | Disposition: A | Discharge status: Home / Self Care | Payer: Medicare Other | Attending: Interventional Radiology | Admitting: Interventional Radiology

## 2022-03-17 ENCOUNTER — Other Ambulatory Visit: Payer: Self-pay

## 2022-03-17 ENCOUNTER — Encounter (HOSPITAL_COMMUNITY)
Admission: RE | Disposition: A | Discharge status: Home / Self Care | Payer: Self-pay | Source: Home / Self Care | Attending: Interventional Radiology

## 2022-03-17 ENCOUNTER — Inpatient Hospital Stay (HOSPITAL_COMMUNITY): Payer: Medicare Other | Admitting: Emergency Medicine

## 2022-03-17 ENCOUNTER — Inpatient Hospital Stay (HOSPITAL_COMMUNITY)
Admission: RE | Admit: 2022-03-17 | Discharge: 2022-03-17 | Disposition: A | Discharge status: Home / Self Care | Payer: Medicare Other | Source: Ambulatory Visit | Attending: Interventional Radiology | Admitting: Interventional Radiology

## 2022-03-17 ENCOUNTER — Encounter (HOSPITAL_COMMUNITY): Payer: Self-pay | Admitting: Interventional Radiology

## 2022-03-17 ENCOUNTER — Inpatient Hospital Stay (HOSPITAL_COMMUNITY): Payer: Medicare Other | Admitting: Anesthesiology

## 2022-03-17 DIAGNOSIS — K219 Gastro-esophageal reflux disease without esophagitis: Secondary | ICD-10-CM | POA: Diagnosis not present

## 2022-03-17 DIAGNOSIS — I2510 Atherosclerotic heart disease of native coronary artery without angina pectoris: Secondary | ICD-10-CM

## 2022-03-17 DIAGNOSIS — F32A Depression, unspecified: Secondary | ICD-10-CM | POA: Diagnosis not present

## 2022-03-17 DIAGNOSIS — Z8249 Family history of ischemic heart disease and other diseases of the circulatory system: Secondary | ICD-10-CM

## 2022-03-17 DIAGNOSIS — K7460 Unspecified cirrhosis of liver: Secondary | ICD-10-CM

## 2022-03-17 DIAGNOSIS — E1122 Type 2 diabetes mellitus with diabetic chronic kidney disease: Secondary | ICD-10-CM | POA: Diagnosis not present

## 2022-03-17 DIAGNOSIS — R188 Other ascites: Secondary | ICD-10-CM | POA: Diagnosis present

## 2022-03-17 DIAGNOSIS — Z95828 Presence of other vascular implants and grafts: Secondary | ICD-10-CM | POA: Insufficient documentation

## 2022-03-17 DIAGNOSIS — E1140 Type 2 diabetes mellitus with diabetic neuropathy, unspecified: Secondary | ICD-10-CM

## 2022-03-17 DIAGNOSIS — I700 Atherosclerosis of aorta: Secondary | ICD-10-CM | POA: Diagnosis not present

## 2022-03-17 DIAGNOSIS — K766 Portal hypertension: Secondary | ICD-10-CM | POA: Diagnosis present

## 2022-03-17 DIAGNOSIS — E782 Mixed hyperlipidemia: Secondary | ICD-10-CM | POA: Diagnosis present

## 2022-03-17 DIAGNOSIS — Z833 Family history of diabetes mellitus: Secondary | ICD-10-CM

## 2022-03-17 DIAGNOSIS — I10 Essential (primary) hypertension: Secondary | ICD-10-CM | POA: Diagnosis not present

## 2022-03-17 DIAGNOSIS — N1832 Chronic kidney disease, stage 3b: Secondary | ICD-10-CM | POA: Diagnosis present

## 2022-03-17 DIAGNOSIS — R110 Nausea: Secondary | ICD-10-CM | POA: Diagnosis not present

## 2022-03-17 DIAGNOSIS — Z79899 Other long term (current) drug therapy: Secondary | ICD-10-CM | POA: Diagnosis not present

## 2022-03-17 DIAGNOSIS — I129 Hypertensive chronic kidney disease with stage 1 through stage 4 chronic kidney disease, or unspecified chronic kidney disease: Secondary | ICD-10-CM | POA: Diagnosis present

## 2022-03-17 DIAGNOSIS — K7469 Other cirrhosis of liver: Principal | ICD-10-CM | POA: Diagnosis present

## 2022-03-17 DIAGNOSIS — Z823 Family history of stroke: Secondary | ICD-10-CM

## 2022-03-17 DIAGNOSIS — Z9861 Coronary angioplasty status: Secondary | ICD-10-CM

## 2022-03-17 DIAGNOSIS — E119 Type 2 diabetes mellitus without complications: Secondary | ICD-10-CM

## 2022-03-17 HISTORY — PX: IR INTRAVASCULAR ULTRASOUND NON CORONARY: IMG6085

## 2022-03-17 HISTORY — PX: IR PARACENTESIS: IMG2679

## 2022-03-17 HISTORY — DX: Presence of other vascular implants and grafts: Z95.828

## 2022-03-17 HISTORY — PX: RADIOLOGY WITH ANESTHESIA: SHX6223

## 2022-03-17 HISTORY — PX: IR TIPS: IMG2295

## 2022-03-17 HISTORY — PX: IR US GUIDE VASC ACCESS RIGHT: IMG2390

## 2022-03-17 LAB — GLUCOSE, CAPILLARY
Glucose-Capillary: 129 mg/dL — ABNORMAL HIGH (ref 70–99)
Glucose-Capillary: 135 mg/dL — ABNORMAL HIGH (ref 70–99)

## 2022-03-17 LAB — PROTIME-INR
INR: 1.3 — ABNORMAL HIGH (ref 0.8–1.2)
Prothrombin Time: 15.6 seconds — ABNORMAL HIGH (ref 11.4–15.2)

## 2022-03-17 SURGERY — IR WITH ANESTHESIA
Anesthesia: General

## 2022-03-17 MED ORDER — ALBUMIN HUMAN 5 % IV SOLN: INTRAVENOUS | Status: DC | PRN

## 2022-03-17 MED ORDER — ACETAMINOPHEN 325 MG PO TABS
650.0000 mg | ORAL_TABLET | Freq: Four times a day (QID) | ORAL | Status: DC | PRN
  Administered 2022-03-17: 650 mg via ORAL
  Filled 2022-03-17: qty 2

## 2022-03-17 MED ORDER — PHENYLEPHRINE HCL-NACL 20-0.9 MG/250ML-% IV SOLN
INTRAVENOUS | Status: DC | PRN
  Administered 2022-03-17: 30 ug/min via INTRAVENOUS

## 2022-03-17 MED ORDER — LACTULOSE 10 GM/15ML PO SOLN
20.0000 g | Freq: Three times a day (TID) | ORAL | Status: DC
Start: 2022-03-17 — End: 2022-03-19
  Administered 2022-03-17 – 2022-03-19 (×6): 20 g via ORAL
  Filled 2022-03-17 (×6): qty 30

## 2022-03-17 MED ORDER — ONDANSETRON HCL 4 MG/2ML IJ SOLN
4.0000 mg | Freq: Four times a day (QID) | INTRAMUSCULAR | Status: DC | PRN
  Administered 2022-03-17 – 2022-03-18 (×3): 4 mg via INTRAVENOUS
  Filled 2022-03-17 (×3): qty 2

## 2022-03-17 MED ORDER — SPIRONOLACTONE 12.5 MG HALF TABLET
12.5000 mg | ORAL_TABLET | Freq: Every morning | ORAL | Status: DC
  Administered 2022-03-18 – 2022-03-19 (×2): 12.5 mg via ORAL
  Filled 2022-03-17 (×3): qty 1

## 2022-03-17 MED ORDER — INSULIN ASPART 100 UNIT/ML IJ SOLN: 0.0000 [IU] | INTRAMUSCULAR | Status: DC | PRN

## 2022-03-17 MED ORDER — SODIUM CHLORIDE 0.9 % IV SOLN: INTRAVENOUS | Status: DC

## 2022-03-17 MED ORDER — PROPOFOL 10 MG/ML IV BOLUS
INTRAVENOUS | Status: DC | PRN
  Administered 2022-03-17 (×2): 50 ug via INTRAVENOUS
  Administered 2022-03-17: 150 ug via INTRAVENOUS
  Administered 2022-03-17: 40 ug via INTRAVENOUS
  Administered 2022-03-17: 50 ug via INTRAVENOUS

## 2022-03-17 MED ORDER — PROPOFOL 500 MG/50ML IV EMUL
INTRAVENOUS | Status: DC | PRN
Start: 2022-03-17 — End: 2022-03-17
  Administered 2022-03-17: 150 ug/kg/min via INTRAVENOUS

## 2022-03-17 MED ORDER — IOHEXOL 300 MG/ML  SOLN
100.0000 mL | Freq: Once | INTRAMUSCULAR | Status: AC | PRN
  Administered 2022-03-17: 50 mL via INTRAVENOUS

## 2022-03-17 MED ORDER — SODIUM CHLORIDE 0.9 % IV SOLN
1.0000 g | INTRAVENOUS | Status: AC
  Administered 2022-03-17: 1 g via INTRAVENOUS
  Filled 2022-03-17: qty 10

## 2022-03-17 MED ORDER — ONDANSETRON HCL 4 MG/2ML IJ SOLN
INTRAMUSCULAR | Status: AC
  Filled 2022-03-17: qty 2

## 2022-03-17 MED ORDER — SERTRALINE HCL 50 MG PO TABS
25.0000 mg | ORAL_TABLET | Freq: Every morning | ORAL | Status: DC
  Administered 2022-03-18 – 2022-03-19 (×2): 25 mg via ORAL
  Filled 2022-03-17 (×2): qty 1

## 2022-03-17 MED ORDER — HYDROMORPHONE HCL 1 MG/ML IJ SOLN: 0.2500 mg | INTRAMUSCULAR | Status: DC | PRN

## 2022-03-17 MED ORDER — PANTOPRAZOLE SODIUM 40 MG PO TBEC
40.0000 mg | DELAYED_RELEASE_TABLET | Freq: Every morning | ORAL | Status: DC
  Administered 2022-03-18 – 2022-03-19 (×2): 40 mg via ORAL
  Filled 2022-03-17 (×2): qty 1

## 2022-03-17 MED ORDER — ONDANSETRON HCL 4 MG/2ML IJ SOLN
INTRAMUSCULAR | Status: DC | PRN
  Administered 2022-03-17: 4 mg via INTRAVENOUS

## 2022-03-17 MED ORDER — LACTATED RINGERS IV SOLN: INTRAVENOUS | Status: DC | PRN

## 2022-03-17 MED ORDER — FENTANYL CITRATE (PF) 250 MCG/5ML IJ SOLN
INTRAMUSCULAR | Status: DC | PRN
  Administered 2022-03-17: 100 ug via INTRAVENOUS

## 2022-03-17 MED ORDER — PHENYLEPHRINE 80 MCG/ML (10ML) SYRINGE FOR IV PUSH (FOR BLOOD PRESSURE SUPPORT)
PREFILLED_SYRINGE | INTRAVENOUS | Status: DC | PRN
  Administered 2022-03-17: 80 ug via INTRAVENOUS

## 2022-03-17 MED ORDER — MEPERIDINE HCL 25 MG/ML IJ SOLN: 6.2500 mg | INTRAMUSCULAR | Status: DC | PRN

## 2022-03-17 MED ORDER — CHLORHEXIDINE GLUCONATE 0.12 % MT SOLN
15.0000 mL | Freq: Once | OROMUCOSAL | Status: AC
  Administered 2022-03-17: 15 mL via OROMUCOSAL
  Filled 2022-03-17: qty 15

## 2022-03-17 MED ORDER — ISOSORBIDE MONONITRATE ER 30 MG PO TB24
30.0000 mg | ORAL_TABLET | Freq: Every evening | ORAL | Status: DC
  Administered 2022-03-17 – 2022-03-18 (×2): 30 mg via ORAL
  Filled 2022-03-17 (×2): qty 1

## 2022-03-17 MED ORDER — ACEBUTOLOL HCL 200 MG PO CAPS
200.0000 mg | ORAL_CAPSULE | Freq: Every evening | ORAL | Status: DC
  Administered 2022-03-17 – 2022-03-18 (×2): 200 mg via ORAL
  Filled 2022-03-17 (×3): qty 1

## 2022-03-17 MED ORDER — ORAL CARE MOUTH RINSE: 15.0000 mL | Freq: Once | OROMUCOSAL | Status: AC

## 2022-03-17 MED ORDER — SUGAMMADEX SODIUM 200 MG/2ML IV SOLN
INTRAVENOUS | Status: DC | PRN
  Administered 2022-03-17: 200 mg via INTRAVENOUS

## 2022-03-17 MED ORDER — LACTATED RINGERS IV SOLN: INTRAVENOUS | Status: DC

## 2022-03-17 MED ORDER — ONDANSETRON HCL 4 MG/2ML IJ SOLN
4.0000 mg | Freq: Once | INTRAMUSCULAR | Status: AC | PRN
  Administered 2022-03-17: 4 mg via INTRAVENOUS

## 2022-03-17 MED ORDER — OXYCODONE HCL 5 MG PO TABS
10.0000 mg | ORAL_TABLET | ORAL | Status: DC | PRN
  Administered 2022-03-17: 10 mg via ORAL
  Filled 2022-03-17: qty 2

## 2022-03-17 MED ORDER — ROCURONIUM BROMIDE 10 MG/ML (PF) SYRINGE
PREFILLED_SYRINGE | INTRAVENOUS | Status: DC | PRN
  Administered 2022-03-17: 30 mg via INTRAVENOUS
  Administered 2022-03-17: 70 mg via INTRAVENOUS

## 2022-03-17 MED ORDER — OXYCODONE HCL 5 MG PO TABS
5.0000 mg | ORAL_TABLET | ORAL | Status: DC | PRN
  Administered 2022-03-17: 5 mg via ORAL
  Filled 2022-03-17: qty 1

## 2022-03-17 MED ORDER — LIDOCAINE 2% (20 MG/ML) 5 ML SYRINGE
INTRAMUSCULAR | Status: DC | PRN
  Administered 2022-03-17: 100 mg via INTRAVENOUS

## 2022-03-17 NOTE — Sedation Documentation (Signed)
Pt transported to PACU bay 4 with CRNA. Bedside hand off of care given to [facility], [place]. Right neck and right leg access assessed. No bleeding noted. All questions answered.  ?

## 2022-03-17 NOTE — Sedation Documentation (Signed)
Post Tips, Pre angioplasty  ? ?PV mean: 19 ?RA mean: 12 ?Gradient: 7 ? ?

## 2022-03-17 NOTE — Procedures (Signed)
Interventional Radiology Procedure Note ? ?Procedure:  ?1) Ultrasound guided paracentesis ?2) TIPS ? ?Findings: Please refer to procedural dictation for full description.  3.85 L ascites drained from RLQ.  Right hepatic to right posterior portal vein TIPS created, 8 cm x 8-10 mm Viatorr placed.  Portosystemic gradient from 15 (RA 13, PV 28) to 7 (RA 12, PV 19).   ? ?Right IJ, right GSV access - hemostasis with manual compression ? ?Complications: None immediate ? ?Estimated Blood Loss: 10 mL ? ?Recommendations: ?No head of bed restrictions ?Admit to IR ?Resume home medications ?Start lactulose TID, monitor for hepatic encephalopathy ?CBC, CMP, INR tomorrow am ? ? ?Ruthann Cancer, MD ?Pager: [phone] ? ? ? ?

## 2022-03-17 NOTE — Transfer of Care (Signed)
Immediate Anesthesia Transfer of Care Note ? ?Patient: Matthew Ferguson ? ?Procedure(s) Performed: TIPS ? ?Patient Location: PACU ? ?Anesthesia Type:General ? ?Level of Consciousness: oriented and drowsy ? ?Airway & Oxygen Therapy: Patient Spontanous Breathing and Patient connected to face mask oxygen ? ?Post-op Assessment: Report given to RN and Post -op Vital signs reviewed and stable ? ?Post vital signs: Reviewed and stable ? ?Last Vitals:  ?Vitals Value Taken Time  ?BP 125/64 03/17/22 1047  ?Temp    ?Pulse 59 03/17/22 1051  ?Resp 16 03/17/22 1051  ?SpO2 100 % 03/17/22 1051  ?Vitals shown include unvalidated device data. ? ?Last Pain:  ?Vitals:  ? 03/17/22 0743  ?TempSrc:   ?PainSc: 0-No pain  ?   ? ?  ? ?Complications: No notable events documented. ?

## 2022-03-17 NOTE — Anesthesia Procedure Notes (Signed)
Arterial Line Insertion ?Start/End5/15/2023 7:15 AM, 03/17/2022 7:20 AM ?Performed by: Lyn Hollingshead, MD, Griffin Dakin, CRNA, CRNA ? Patient location: Pre-op. ?Preanesthetic checklist: patient identified, IV checked, site marked, risks and benefits discussed, surgical consent, monitors and equipment checked, pre-op evaluation, timeout performed and anesthesia consent ?Lidocaine 1% used for infiltration ?Left, radial was placed ?Catheter size: 20 G ?Hand hygiene performed  and maximum sterile barriers used  ? ?Attempts: 1 ?Procedure performed without using ultrasound guided technique. ?Following insertion, dressing applied and Biopatch. ?Post procedure assessment: normal and unchanged ? ?Patient tolerated the procedure well with no immediate complications. ? ? ?

## 2022-03-17 NOTE — Anesthesia Postprocedure Evaluation (Signed)
Anesthesia Post Note ? ?Patient: Matthew Ferguson ? ?Procedure(s) Performed: TIPS ? ?  ? ?Patient location during evaluation: PACU ?Anesthesia Type: General ?Level of consciousness: awake ?Pain management: pain level controlled ?Vital Signs Assessment: post-procedure vital signs reviewed and stable ?Respiratory status: spontaneous breathing ?Cardiovascular status: bradycardic, stable and blood pressure returned to baseline ?Postop Assessment: no apparent nausea or vomiting ?Anesthetic complications: no ? ? ?No notable events documented. ? ?Last Vitals:  ?Vitals:  ? 03/17/22 1700 03/17/22 1806  ?BP: (!) 154/68 (!) 153/58  ?Pulse: (!) 56 (!) 55  ?Resp: 18 17  ?Temp:    ?SpO2: 100% 100%  ?  ?Last Pain:  ?Vitals:  ? 03/17/22 1815  ?TempSrc:   ?PainSc: 4   ? ?Pain Goal: Patients Stated Pain Goal: 3 (03/17/22 1710) ? ?  ?  ?  ?  ?  ?  ?  ? ?Huston Foley ? ? ? ? ?

## 2022-03-17 NOTE — Sedation Documentation (Signed)
Pre RA: 15/11 (13) ?

## 2022-03-17 NOTE — Anesthesia Procedure Notes (Signed)
Procedure Name: Intubation ?Date/Time: 03/17/2022 8:37 AM ?Performed by: Griffin Dakin, CRNA ?Pre-anesthesia Checklist: Patient identified, Emergency Drugs available, Suction available and Patient being monitored ?Patient Re-evaluated:Patient Re-evaluated prior to induction ?Oxygen Delivery Method: Circle system utilized ?Preoxygenation: Pre-oxygenation with 100% oxygen ?Induction Type: IV induction ?Ventilation: Mask ventilation without difficulty and Oral airway inserted - appropriate to patient size ?Laryngoscope Size: Glidescope and 4 ?Grade View: Grade I ?Tube type: Oral ?Tube size: 7.5 mm ?Number of attempts: 1 ?Airway Equipment and Method: Rigid stylet and Video-laryngoscopy ?Placement Confirmation: ETT inserted through vocal cords under direct vision, positive ETCO2 and breath sounds checked- equal and bilateral ?Secured at: 24 cm ?Tube secured with: Tape ?Dental Injury: Teeth and Oropharynx as per pre-operative assessment  ? ? ? ? ?

## 2022-03-17 NOTE — Sedation Documentation (Signed)
No arterial access as per provider, pt is able to sit up in recovery  ?

## 2022-03-17 NOTE — H&P (Signed)
? ?Chief Complaint: ?Patient was seen in consultation today for Transjugular Intrahepatic Portal System Shunt placement at the request of Dr Madelon Lips ? ? ?Supervising Physician: Ruthann Cancer ? ?Patient Status: [facility] - Out-pt ? ?History of Present Illness: ?Matthew Ferguson is a 75 y.o. male  ? ?Hc CKD; HTN; HLD; DM; GERD ?Hx Cryptogenic cirrhosis ?Recurrent ascites ?Required 2 paracentesis in last 4-6 weeks ?Was referred to Dr Serafina Royals from Dr Loletha Carrow ?Last paracentesis 03/04/22: 3.7 L ? ?Seen in consultation 02/24/22 ?He was initially diagnosed with cryptogenic cirrhosis in 2022, however describes being hospitalizes in [place] in 1969 for what he was told was hepatitis C.  Serology has not detected HCV.  He has required 2 paracenteses over the past month, 3/24 and 4/19, and states that he has gained 4 pounds and is expecting another paracentesis soon.  He describes several other paracenteses at outside facilities prior to transferring care to [facility].  No history of hepatic hydrothorax or encephalopathy.   He has never been evaluated for liver transplant, and never been diagnosed with hepatocellular carcinoma.  He does have a history of grade II esophageal varices visualized on EGD on 04/01/21 by Dr. Loletha Carrow, not requiring intervention.  He mentions being hospitalized in December 2022 for blood in his stool and severely low blood pressure, which didn't require intervention.  No prior episodes of frank hematemesis, however he does occasionally spit up blood when he wakes up in the morning, none in the last 2-3 weeks.  He is currently on 25 mg spironolactone daily, no furosemide.  This dose is limited by his renal function.  He has a history of coronary artery disease status post PCI in 2013.  Other pertinent past medical history includes post operative nausea and vomiting, hypertension, hyperlipidemia, gastroesophageal reflux disorder, and diabetes. ?history of cryptogenic cirrhosis (Child Pugh B, MELD 16) with portal hypertension  and recurrent ascites in the setting of decreased diuresis limited by renal function.  He would likely benefit from TIPS creation for recurrent ascites and presumed hepatorenal syndrome would likely improve.  ?  ?Risks and benefits of TIPS and/or additional variceal embolization were discussed with the patient including, but not limited to, infection, bleeding, damage to adjacent structures, worsening hepatic and/or cardiac function, worsening and/or the development of altered mental status/encephalopathy, non-target embolization and death.  ?All of the patient's questions were answered, patient is agreeable to proceed. ? ?Echo 5/8: Left Ventricle: Left ventricular ejection fraction, by estimation, is 65 to 70%. The left ventricle has ?normal function. The left ventricle has no regional wall motion abnormalities. The left ventricular internal ?cavity size was normal in size. There is mild concentric left ventricular hypertrophy. Left ventricular ?diastolic parameters are consistent with Grade II diastolic dysfunction (pseudonormalization). Normal left ?ventricular filling pressure. ?Right Ventricle: The right ventricular size is normal. No increase in right ventricular wall thickness. ?Right ventricular systolic function is normal. Tricuspid regurgitation signal is inadequate for assessing PA ?pressure. ? ?CTA 5/5: IMPRESSION: ?Cirrhosis and ascites again demonstrated. ?Portal system patent, with recanalization of the umbilical vein. ? ?Scheduled now for TIPS placement in IR with Dr Serafina Royals ?To be admitted for overnight observation-- plan for DC next day ? ?Past Medical History:  ?Diagnosis Date  ? Ascites   ? Chronic kidney disease   ? Cirrhosis ([place])   ? Complication of anesthesia   ? Post operative vomiting for "2 weeks" per pt after cholecystectomy  ? Coronary artery disease involving native coronary artery 05/29/2015  ? 05/08/12 had PCI and Xience  stent to RCA for Troponin normal unstable angina with residual  40-50% proximal and 30-40 % mid LAD stenosis, EF normal 55-60%  ? Depression   ? Essential hypertension 05/29/2015  ? GERD (gastroesophageal reflux disease)   ? Hepatitis-C 03/31/2021  ? undetectable virus on 03/31/2021.    ? Irritable bowel syndrome   ? Mixed hyperlipidemia 05/29/2015  ? Palpitations   ? PONV (postoperative nausea and vomiting)   ? Tinnitus   ? Type 2 diabetes mellitus ([place]) 05/29/2015  ? ? ?Past Surgical History:  ?Procedure Laterality Date  ? BACK SURGERY    ? CARDIAC CATHETERIZATION    ? CHOLECYSTECTOMY    ? COLONOSCOPY WITH PROPOFOL N/A 04/02/2021  ? Procedure: COLONOSCOPY WITH PROPOFOL;  Surgeon: Irene Shipper, MD;  Location: [facility] ENDOSCOPY;  Service: Endoscopy;  Laterality: N/A;  ? CORONARY ANGIOPLASTY WITH STENT PLACEMENT    ? ESOPHAGOGASTRODUODENOSCOPY (EGD) WITH PROPOFOL N/A 04/01/2021  ? Procedure: ESOPHAGOGASTRODUODENOSCOPY (EGD) WITH PROPOFOL;  Surgeon: Doran Stabler, MD;  Location: [place];  Service: Gastroenterology;  Laterality: N/A;  ? INGUINAL HERNIA REPAIR Bilateral 01/2021  ? IR PARACENTESIS  10/17/2021  ? IR PARACENTESIS  01/24/2022  ? IR PARACENTESIS  02/19/2022  ? IR PARACENTESIS  03/05/2022  ? IR RADIOLOGIST EVAL & MGMT  02/24/2022  ? KNEE SURGERY    ? ? ?Allergies: ?Patient has no known allergies. ? ?Medications: ?Prior to Admission medications   ?Medication Sig Start Date End Date Taking? Authorizing Provider  ?acebutolol (SECTRAL) 200 MG capsule Take 1 capsule (200 mg total) by mouth daily. ?Patient taking differently: Take 200 mg by mouth every evening. 02/11/22  Yes Richardo Priest, MD  ?aMILoride (MIDAMOR) 5 MG tablet Take 5 mg by mouth in the morning.   Yes [provider]  ?fluticasone (FLONASE) 50 MCG/ACT nasal spray Place 1 spray into both nostrils as needed for congestion. 02/12/21  Yes [provider]  ?isosorbide mononitrate (IMDUR) 30 MG 24 hr tablet Take 1 tablet (30 mg total) by mouth daily. ?Patient taking differently: Take 30 mg by mouth  every evening. 07/03/21 07/05/27 Yes Munley, Hilton Cork, MD  ?nitroGLYCERIN (NITROSTAT) 0.4 MG SL tablet DISSOLVE 1 TABLET UNDER THE TONGUE EVERY 5 MINUTES FOR UP TO 3 DOSES AS NEEDED FOR CHEST PAIN 04/30/21  Yes Richardo Priest, MD  ?pantoprazole (PROTONIX) 40 MG tablet Take 40 mg by mouth in the morning.   Yes [provider]  ?sertraline (ZOLOFT) 25 MG tablet Take 25 mg by mouth in the morning. 10/02/21  Yes [provider]  ?spironolactone (ALDACTONE) 25 MG tablet Take 12.5 mg by mouth in the morning. 02/05/22  Yes [provider]  ?Microlet Lancets MISC  11/22/21   [provider]  ?neomycin-polymyxin-hydrocortisone (CORTISPORIN) OTIC solution Apply 1 drop to both big toes at painful corners after bath/shower once a day until pain is better 03/11/22   Landis Martins, DPM  ?  ? ?Family History  ?Problem Relation Age of Onset  ? Other Mother   ?     died with old age  ? Stroke Father   ? Diabetes Father   ? Heart disease Father   ? Congenital heart disease Daughter   ? Colon cancer Neg Hx   ? Esophageal cancer Neg Hx   ? Pancreatic cancer Neg Hx   ? Stomach cancer Neg Hx   ? ? ?Social History  ? ?Socioeconomic History  ? Marital status: Married  ?  Spouse name: Fraser Din  ? Number  of children: 2  ? Years of education: Not on file  ? Highest education level: Not on file  ?Occupational History  ? Occupation: retired  ?Tobacco Use  ? Smoking status: Never  ?  Passive exposure: Past  ? Smokeless tobacco: Never  ?Vaping Use  ? Vaping Use: Never used  ?Substance and Sexual Activity  ? Alcohol use: Never  ? Drug use: Never  ? Sexual activity: Not on file  ?Other Topics Concern  ? Not on file  ?Social History Narrative  ? Not on file  ? ?Social Determinants of Health  ? ?Financial Resource Strain: Not on file  ?Food Insecurity: Not on file  ?Transportation Needs: Not on file  ?Physical Activity: Not on file  ?Stress: Not on file  ?Social Connections: Not on file  ? ? ?Review of Systems: A 12 point ROS  discussed and pertinent positives are indicated in the HPI above.  All other systems are negative. ? ?Review of Systems  ?Constitutional:  Positive for activity change, appetite change, fatigue and unexpect

## 2022-03-17 NOTE — Sedation Documentation (Addendum)
PRE tips PV: Mean 28 ? ?Gradient 15 ?

## 2022-03-18 ENCOUNTER — Encounter (HOSPITAL_COMMUNITY): Payer: Self-pay | Admitting: Interventional Radiology

## 2022-03-18 LAB — COMPREHENSIVE METABOLIC PANEL
ALT: 55 U/L — ABNORMAL HIGH (ref 0–44)
AST: 83 U/L — ABNORMAL HIGH (ref 15–41)
Albumin: 2.8 g/dL — ABNORMAL LOW (ref 3.5–5.0)
Alkaline Phosphatase: 107 U/L (ref 38–126)
Anion gap: 12 (ref 5–15)
BUN: 15 mg/dL (ref 8–23)
CO2: 17 mmol/L — ABNORMAL LOW (ref 22–32)
Calcium: 8.6 mg/dL — ABNORMAL LOW (ref 8.9–10.3)
Chloride: 104 mmol/L (ref 98–111)
Creatinine, Ser: 1.54 mg/dL — ABNORMAL HIGH (ref 0.61–1.24)
GFR, Estimated: 46 mL/min — ABNORMAL LOW (ref >60)
Glucose, Bld: 177 mg/dL — ABNORMAL HIGH (ref 70–99)
Potassium: 4.5 mmol/L (ref 3.5–5.1)
Sodium: 133 mmol/L — ABNORMAL LOW (ref 135–145)
Total Bilirubin: 2.5 mg/dL — ABNORMAL HIGH (ref 0.3–1.2)
Total Protein: 6.1 g/dL — ABNORMAL LOW (ref 6.5–8.1)

## 2022-03-18 LAB — CBC
HCT: 31.3 % — ABNORMAL LOW (ref 39.0–52.0)
Hemoglobin: 10.6 g/dL — ABNORMAL LOW (ref 13.0–17.0)
MCH: 32.7 pg (ref 26.0–34.0)
MCHC: 33.9 g/dL (ref 30.0–36.0)
MCV: 96.6 fL (ref 80.0–100.0)
Platelets: 133 10*3/uL — ABNORMAL LOW (ref 150–400)
RBC: 3.24 MIL/uL — ABNORMAL LOW (ref 4.22–5.81)
RDW: 13.1 % (ref 11.5–15.5)
WBC: 9.6 10*3/uL (ref 4.0–10.5)
nRBC: 0 % (ref 0.0–0.2)

## 2022-03-18 LAB — PROTIME-INR
INR: 1.4 — ABNORMAL HIGH (ref 0.8–1.2)
Prothrombin Time: 16.7 seconds — ABNORMAL HIGH (ref 11.4–15.2)

## 2022-03-18 MED ORDER — PROCHLORPERAZINE EDISYLATE 10 MG/2ML IJ SOLN
10.0000 mg | Freq: Once | INTRAMUSCULAR | Status: AC
  Administered 2022-03-18: 10 mg via INTRAVENOUS
  Filled 2022-03-18: qty 2

## 2022-03-18 MED ORDER — SODIUM CHLORIDE 0.9 % IV BOLUS
1000.0000 mL | Freq: Once | INTRAVENOUS | Status: AC
  Administered 2022-03-18: 1000 mL via INTRAVENOUS

## 2022-03-18 NOTE — Progress Notes (Signed)
? ? ?Supervising Physician: Ruthann Cancer ? ?Patient Status:  [name] - In-pt ? ?Chief Complaint: ? ?1 day s/p TIPS ? ?Subjective: ? ?Nauseated, dry heaves. Patient believes this to be secondary to prior day anesthesia.  Feels he is too nauseated and week to go home.  Has been resistant to taking lactulose in fear of vomiting, but RN encouraged it.  No bowel movements. ? ?Allergies: ?Patient has no known allergies. ? ?Medications: ?Prior to Admission medications   ?Medication Sig Start Date End Date Taking? Authorizing Provider  ?acebutolol (SECTRAL) 200 MG capsule Take 1 capsule (200 mg total) by mouth daily. ?Patient taking differently: Take 200 mg by mouth every evening. 02/11/22  Yes Richardo Priest, MD  ?aMILoride (MIDAMOR) 5 MG tablet Take 5 mg by mouth in the morning.   Yes [provider]  ?fluticasone (FLONASE) 50 MCG/ACT nasal spray Place 1 spray into both nostrils as needed for congestion. 02/12/21  Yes [provider]  ?isosorbide mononitrate (IMDUR) 30 MG 24 hr tablet Take 1 tablet (30 mg total) by mouth daily. ?Patient taking differently: Take 30 mg by mouth every evening. 07/03/21 07/05/27 Yes Richardo Priest, MD  ?neomycin-polymyxin-hydrocortisone (CORTISPORIN) OTIC solution Apply 1 drop to both big toes at painful corners after bath/shower once a day until pain is better 03/11/22  Yes Stover, Titorya, DPM  ?nitroGLYCERIN (NITROSTAT) 0.4 MG SL tablet DISSOLVE 1 TABLET UNDER THE TONGUE EVERY 5 MINUTES FOR UP TO 3 DOSES AS NEEDED FOR CHEST PAIN 04/30/21  Yes Richardo Priest, MD  ?pantoprazole (PROTONIX) 40 MG tablet Take 40 mg by mouth in the morning.   Yes [provider]  ?sertraline (ZOLOFT) 25 MG tablet Take 25 mg by mouth in the morning. 10/02/21  Yes [provider]  ?spironolactone (ALDACTONE) 25 MG tablet Take 12.5 mg by mouth in the morning. 02/05/22  Yes [provider]  ?Microlet Lancets [place]  11/22/21   [provider]  ? ? ? ?Vital Signs: ?BP 134/64  (BP Location: Right Arm)   Pulse (!) 56   Temp 97.7 ?F (36.5 ?C) (Oral)   Resp 18   Ht '5\' 9"'$  (1.753 m)   Wt 187 lb (84.8 kg)   SpO2 100%   BMI 27.62 kg/m?  ? ?Physical Exam ?Vitals reviewed.  ?Constitutional:   ?   Appearance: He is ill-appearing.  ?HENT:  ?   Head: Normocephalic and atraumatic.  ?   Mouth/Throat:  ?   Pharynx: Oropharynx is clear.  ?Pulmonary:  ?   Effort: Pulmonary effort is normal.  ?Abdominal:  ?   General: Abdomen is flat.  ?   Palpations: Abdomen is soft.  ?Skin: ?   General: Skin is dry.  ?   Coloration: Skin is pale.  ?Neurological:  ?   General: No focal deficit present.  ?   Mental Status: He is alert and oriented to person, place, and time.  ? ? ?Imaging: ?IR Tips ? ?Result Date: 03/17/2022 ?CLINICAL DATA:  Matthew Ferguson is a 76 y.o. male with history of cryptogenic cirrhosis (Child Pugh B, MELD 16) with portal hypertension and recurrent ascites in the setting of decreased diuresis limited by renal function. EXAM: 1. Ultrasound-guided paracentesis 2. Ultrasound-guided access of the right internal jugular vein 3. Ultrasound-guided access of the right greater saphenous vein 4. Intravascular ultrasound 5. Catheterization of the portal vein 6. Portal venous and central manometry 7. Portal venogram 8. Creation of a transhepatic portal vein to hepatic vein shunt MEDICATIONS: As  antibiotic prophylaxis, Rocephin 1 gm IV was ordered pre-procedure and administered intravenously within one hour of incision. ANESTHESIA/SEDATION: General - as administered by the Anesthesia department CONTRAST:  Fifty ML Omnipaque 300, intravenous FLUOROSCOPY TIME:  Fluoroscopy Time: 8 minutes, 42 seconds (66 mGy). COMPLICATIONS: None immediate. PROCEDURE: The procedure was performed with the assistance of my associate, Dr. Michaelle Birks. Informed written consent was obtained from the patient after a thorough discussion of the procedural risks, benefits and alternatives. All questions were addressed. Maximal Sterile  Barrier Technique was utilized including caps, mask, sterile gowns, sterile gloves, sterile drape, hand hygiene and skin antiseptic. A timeout was performed prior to the initiation of the procedure. Preprocedure ultrasound evaluation demonstrated large volume abdominal ascites. The right lower quadrant was prepped and draped in standard fashion. A small skin nick was made. A 6 French Safe-T-Centesis catheter was introduced into the peritoneum with aspiration of translucent, straw-colored fluid. A total of 3850 mL of ascites was drained throughout the duration of this procedure. A preliminary ultrasound of the right groin was performed and demonstrates a patent right common femoral vein. A permanent ultrasound image was recorded. Using a combination of fluoroscopy and ultrasound, an access site was determined. A small dermatotomy was made at the planned puncture site. Using ultrasound guidance, access into the right greater saphenous vein was obtained with visualization of needle entry into the vessel using a standard micropuncture technique. A wire was advanced into the IVC insert all fascial dilation performed. An 8 [place], 25 cm vascular sheath was placed into the external iliac vein. Through this access site, an 31 [place] ICE catheter was advanced with ease under fluoroscopic guidance to the level of the intrahepatic inferior vena cava. A preliminary ultrasound of the right neck was performed and demonstrates a patent internal jugular vein. A permanent ultrasound image was recorded. Using a combination of fluoroscopy and ultrasound, an access site was determined. A small dermatotomy was made at the planned puncture site. Using ultrasound guidance, access into the right internal jugular vein was obtained with visualization of needle entry into the vessel using a standard micropuncture technique. A wire was advanced into the IVC and serial fascial dilation performed. A 10 French tips sheath was placed into  the internal jugular vein and advanced to the IVC. The jugular sheath was retracted into the right atrium and manometry was performed. A 5 French angled tip catheter was then directed into the right hepatic vein. The catheter was advanced to a wedge portion of the a patent vein over which the 10 French sheath was advanced into the right hepatic vein. Using ICE ultrasound visualization the catheter as right hepatic vein as well as the portal anatomy was defined. A planned exit site from the hepatic vein and puncture site from the portal vein was placed into a single sonographic plane. Under direct ultrasound visualization, the ScorpionX needle was advanced into the central right portal vein. A Glidewire was then advanced into the superior mesenteric vein. A 5 French marking pigtail catheter was then advanced over the wire into the main portal vein and wire removed. Portal venogram was performed which demonstrated a patent portal vein. A few very small varices were seen arising from the main portal vein. Portal manometry was then performed. The tract was then dilated to 8 mm with an 8 mm x 4 mm Mustang balloon. A 8-10 mm by 8 + 2 mm of Viatorr was placed. After placement of the shunt, right atrial and portal pressures were repeated.  No additional post deployment balloon molding was necessary. Completion portal venogram demonstrates a patent TIPS endograft without significant gastroesophageal varices. The catheters and sheath were removed and manual compression was applied to the right internal jugular and right common femoral venous access sites until hemostasis was achieved. The patient was transferred to the PACU in stable condition. Pre-TIPS Mean Pressures (mmHg): Right atrium: 13 Portal vein: 28 Portosystemic gradient: 15 Post-TIPS Mean Pressures (mmHg): Right atrium:12 Portal vein: 19 Portosystemic gradient: 7 IMPRESSION: 1. Successful transjugular portosystemic shunt creation. 2. Portosystemic gradient of 15 mm  Hg (absolute portal venous pressure 28 mm Hg) before shunt placement and 7 mm Hg (absolute portal venous pressure 19 mm Hg) after shunt placement. PLAN: Postprocedure follow-up as per Portal Hypertension Clin

## 2022-03-19 ENCOUNTER — Other Ambulatory Visit: Payer: Self-pay | Admitting: Radiology

## 2022-03-19 DIAGNOSIS — Z95828 Presence of other vascular implants and grafts: Secondary | ICD-10-CM

## 2022-03-19 LAB — COMPREHENSIVE METABOLIC PANEL
ALT: 153 U/L — ABNORMAL HIGH (ref 0–44)
AST: 210 U/L — ABNORMAL HIGH (ref 15–41)
Albumin: 2.6 g/dL — ABNORMAL LOW (ref 3.5–5.0)
Alkaline Phosphatase: 107 U/L (ref 38–126)
Anion gap: 8 (ref 5–15)
BUN: 24 mg/dL — ABNORMAL HIGH (ref 8–23)
CO2: 21 mmol/L — ABNORMAL LOW (ref 22–32)
Calcium: 8.5 mg/dL — ABNORMAL LOW (ref 8.9–10.3)
Chloride: 104 mmol/L (ref 98–111)
Creatinine, Ser: 1.81 mg/dL — ABNORMAL HIGH (ref 0.61–1.24)
GFR, Estimated: 38 mL/min — ABNORMAL LOW (ref >60)
Glucose, Bld: 172 mg/dL — ABNORMAL HIGH (ref 70–99)
Potassium: 4 mmol/L (ref 3.5–5.1)
Sodium: 133 mmol/L — ABNORMAL LOW (ref 135–145)
Total Bilirubin: 1.8 mg/dL — ABNORMAL HIGH (ref 0.3–1.2)
Total Protein: 5.5 g/dL — ABNORMAL LOW (ref 6.5–8.1)

## 2022-03-19 LAB — CBC
HCT: 28.9 % — ABNORMAL LOW (ref 39.0–52.0)
Hemoglobin: 9.7 g/dL — ABNORMAL LOW (ref 13.0–17.0)
MCH: 32 pg (ref 26.0–34.0)
MCHC: 33.6 g/dL (ref 30.0–36.0)
MCV: 95.4 fL (ref 80.0–100.0)
Platelets: 139 10*3/uL — ABNORMAL LOW (ref 150–400)
RBC: 3.03 MIL/uL — ABNORMAL LOW (ref 4.22–5.81)
RDW: 13.5 % (ref 11.5–15.5)
WBC: 11.7 10*3/uL — ABNORMAL HIGH (ref 4.0–10.5)
nRBC: 0 % (ref 0.0–0.2)

## 2022-03-19 LAB — PROTIME-INR
INR: 1.5 — ABNORMAL HIGH (ref 0.8–1.2)
Prothrombin Time: 18.4 seconds — ABNORMAL HIGH (ref 11.4–15.2)

## 2022-03-19 MED ORDER — LACTULOSE 10 GM/15ML PO SOLN: 20.0000 g | Freq: Two times a day (BID) | ORAL | 0 refills | Status: DC

## 2022-03-19 NOTE — Progress Notes (Signed)
Discharge instructions given to pt. Pt verbalized understanding of all teaching and had no further questions. 

## 2022-03-19 NOTE — Discharge Summary (Signed)
Physician Discharge Summary  ? ? ? ? ?Patient ID: ?Matthew Ferguson ?MRN: [id number] ?DOB/AGE: [birth year] 76 y.o. ? ?Admit date: 03/17/2022 ?Discharge date: 03/19/2022 ? ?Admission Diagnoses: ?Principal Problem: ?  S/P TIPS (transjugular intrahepatic portosystemic shunt) ? ?Discharge Diagnoses:  ?Principal Problem: ?  S/P TIPS (transjugular intrahepatic portosystemic shunt) ?  ? ?Procedures: ?Procedure(s): ?TIPS ? ?Discharged Condition: good ? ?Hospital Course: ?S/p TIPS on 2/63 without complication. ?Admitted to the floor in stable condition. ?Had some persistent nausea issues on POD #1 with poor po intake. ?On POD #2, nausea has resolved, pt tolerating clears, then regular diet. ?Has had 2 BMs, tolerating lactulose ?Stable for discharge. ?Labs reviewed, showing mild bump in transaminases but otherwise stable. ?All medications, instructions, and follow up plans reviewed with pt. ? ?Consults: None ? ? ?Discharge Exam: ?Blood pressure (!) 141/72, pulse 66, temperature 97.8 ?F (36.6 ?C), temperature source Oral, resp. rate 16, height '5\' 9"'$  (1.753 m), weight 84.8 kg, SpO2 97 %. ?General: NAD, A&O x 3 ?Lungs: CTA without w/r/r ?Heart: Regular ?Abdomen: soft, NT, ND ? ?Procedural (R)IJ and (R)GSV puncture sites clean, NT, no hematoma ? ?Disposition: Discharge disposition: 01-Home or Self Care ? ? ? ? ? ? ?Discharge Instructions   ? ? Call MD for:  difficulty breathing, headache or visual disturbances   Complete by: As directed ?  ? Call MD for:  persistant nausea and vomiting   Complete by: As directed ?  ? Call MD for:  redness, tenderness, or signs of infection (pain, swelling, redness, odor or green/yellow discharge around incision site)   Complete by: As directed ?  ? Call MD for:  severe uncontrolled pain   Complete by: As directed ?  ? Call MD for:  temperature >100.4   Complete by: As directed ?  ? Diet - low sodium heart healthy   Complete by: As directed ?  ? Increase activity slowly   Complete by: As directed ?  ?  Remove dressing in 24 hours   Complete by: As directed ?  ? ?  ? ?Allergies as of 03/19/2022   ?No Known Allergies ?  ? ?  ?Medication List  ?  ? ?TAKE these medications   ? ?acebutolol 200 MG capsule ?Commonly known as: SECTRAL ?Take 1 capsule (200 mg total) by mouth daily. ?What changed: when to take this ?  ?aMILoride 5 MG tablet ?Commonly known as: MIDAMOR ?Take 5 mg by mouth in the morning. ?  ?fluticasone 50 MCG/ACT nasal spray ?Commonly known as: FLONASE ?Place 1 spray into both nostrils as needed for congestion. ?  ?isosorbide mononitrate 30 MG 24 hr tablet ?Commonly known as: IMDUR ?Take 1 tablet (30 mg total) by mouth daily. ?What changed: when to take this ?  ?lactulose 10 GM/15ML solution ?Commonly known as: [place] ?Take 30 mLs (20 g total) by mouth 2 (two) times daily. ?  ?Microlet Lancets Misc ?  ?neomycin-polymyxin-hydrocortisone OTIC solution ?Commonly known as: CORTISPORIN ?Apply 1 drop to both big toes at painful corners after bath/shower once a day until pain is better ?  ?nitroGLYCERIN 0.4 MG SL tablet ?Commonly known as: NITROSTAT ?DISSOLVE 1 TABLET UNDER THE TONGUE EVERY 5 MINUTES FOR UP TO 3 DOSES AS NEEDED FOR CHEST PAIN ?  ?pantoprazole 40 MG tablet ?Commonly known as: PROTONIX ?Take 40 mg by mouth in the morning. ?  ?sertraline 25 MG tablet ?Commonly known as: ZOLOFT ?Take 25 mg by mouth in the morning. ?  ?spironolactone 25 MG tablet ?Commonly known as: ALDACTONE ?  Take 12.5 mg by mouth in the morning. ?  ? ?  ? ? Follow-up Information   ? ? Suttle, Rosanne Ashing, MD. Go to.   ?Specialties: Interventional Radiology, Diagnostic Radiology, Radiology ?Why: Office will call you to schedule follow up visit. ?You will be asked to get labs drawn prior to your follow up visit. ?Contact information: ?[address] ?[address] ?[place] [address] ?[phone] ? ? ?  ?  ? ?  ?  ? ?  ? ? ?Signed: ?Ascencion Dike PA-C ?03/19/2022, 2:46 PM ? ? ? ?

## 2022-03-19 NOTE — TOC CM/SW Note (Signed)
?  Transition of Care (TOC) Screening Note ? ? ?Patient Details  ?Name: Matthew Ferguson ?Date of Birth: 10-16-1946 ? ? ? ? ? ?Transition of Care Department [facility]) has reviewed patient and no TOC needs have been identified at this time. We will continue to monitor patient advancement through interdisciplinary progression rounds. If new patient transition needs arise, please place a TOC consult. ?  ?

## 2022-03-21 ENCOUNTER — Telehealth: Payer: Self-pay | Admitting: Student

## 2022-03-21 NOTE — Telephone Encounter (Signed)
   [facility] TIPS post-procedure phone call follow-up   Matthew Ferguson is a 76 y.o. male who underwent TIPS creation at [facility] 03/17/22 with Dr. Serafina Royals   Patient is 4 days out from procedure.   # of paracentesis in last month: 2, last one was during TIPS # of paracentesis in last 2 months: 4  Current diuretic regimen: spironolactone 12.5 mg daily  Current pharmacologic encephalopathy prophylaxis/treatment: Lactulose 20 grams BID  Post-TIPS Imaging: None   Labs: 03/19/22 Creatinine: 1.81 Total Bilirubin: 1.8 INR: 1.5 Sodium: 133 Albumin: 2.6 Ammonia: n/a  Assessment: Matthew Ferguson is a 76 y.o. male with history of cirrhosis with recurrent ascites.. Patient is 4 days from TIPS creation and is feeling well today. He said he has been a little more active today and has a little more appetite. He is taking his medications as directed. He endorses some mild abdominal pain but does not feel any distention. He denies any nausea. Matthew Ferguson is aware that someone from IR will call him next week for additional follow up and that he can call the clinic if he has any questions/concerns before then.   Recommendation:  Continue current treatment plan with next Clinic follow up phone call scheduled for 03/27/22.  Electronically Signed: Theresa Duty, NP 03/21/2022, 12:07 PM

## 2022-03-24 ENCOUNTER — Other Ambulatory Visit: Payer: Self-pay | Admitting: Interventional Radiology

## 2022-03-24 DIAGNOSIS — K7460 Unspecified cirrhosis of liver: Secondary | ICD-10-CM

## 2022-03-24 DIAGNOSIS — K7469 Other cirrhosis of liver: Secondary | ICD-10-CM

## 2022-03-26 DIAGNOSIS — K7460 Unspecified cirrhosis of liver: Secondary | ICD-10-CM | POA: Diagnosis not present

## 2022-03-26 DIAGNOSIS — I129 Hypertensive chronic kidney disease with stage 1 through stage 4 chronic kidney disease, or unspecified chronic kidney disease: Secondary | ICD-10-CM | POA: Diagnosis not present

## 2022-03-26 DIAGNOSIS — D631 Anemia in chronic kidney disease: Secondary | ICD-10-CM | POA: Diagnosis not present

## 2022-03-26 DIAGNOSIS — E1122 Type 2 diabetes mellitus with diabetic chronic kidney disease: Secondary | ICD-10-CM | POA: Diagnosis not present

## 2022-03-26 DIAGNOSIS — N189 Chronic kidney disease, unspecified: Secondary | ICD-10-CM | POA: Diagnosis not present

## 2022-03-26 DIAGNOSIS — E876 Hypokalemia: Secondary | ICD-10-CM | POA: Diagnosis not present

## 2022-03-26 DIAGNOSIS — N2581 Secondary hyperparathyroidism of renal origin: Secondary | ICD-10-CM | POA: Diagnosis not present

## 2022-03-26 DIAGNOSIS — N1832 Chronic kidney disease, stage 3b: Secondary | ICD-10-CM | POA: Diagnosis not present

## 2022-03-27 ENCOUNTER — Telehealth: Payer: Self-pay | Admitting: Student

## 2022-03-27 NOTE — Telephone Encounter (Signed)
   [facility] TIPS post-procedure phone call follow-up  Matthew Ferguson is a 76 y.o. male who underwent TIPS creation at [facility] 03/17/22 with Dr. Serafina Royals    Patient is 10 days out from procedure.    # of paracentesis in last month: 2, last one was during TIPS # of paracentesis in last 2 months: 4   Current diuretic regimen: spironolactone 12.5 mg daily  Current pharmacologic encephalopathy prophylaxis/treatment: Lactulose 20 grams BID   Post-TIPS Imaging: None   Labs: 03/19/22 Creatinine: 1.81 Total Bilirubin: 1.8 INR: 1.5 Sodium: 133 Albumin: 2.6 Ammonia: n/a  Assessment: Matthew Ferguson is a 76 y.o. male with history of cirrhosis. Patient is 10 days from TIPS creation and states he feels better and more energetic than the last time we spoke. He denies nausea or vomiting but endorses some mild abdominal pain. He states his abdomen feels somewhat distended like he might need a paracentesis. He is taking all of  his medications as directed. He will follow up with his GI doctor next Tuesday (Dr. Loletha Carrow) and I told Matthew Ferguson to have Dr. Loletha Carrow schedule him for a paracentesis if he feels it is warranted. He was encouraged to call IR with any questions/concerns. Patient states he has no needs at this time.   Recommendation:  Continue current treatment plan with next Clinic follow up scheduled for 04/01/22.   Electronically Signed: Theresa Duty, NP 03/27/2022, 11:59 AM

## 2022-04-01 ENCOUNTER — Ambulatory Visit (INDEPENDENT_AMBULATORY_CARE_PROVIDER_SITE_OTHER): Payer: Medicare Other | Admitting: Gastroenterology

## 2022-04-01 ENCOUNTER — Encounter: Payer: Self-pay | Admitting: Gastroenterology

## 2022-04-01 ENCOUNTER — Telehealth: Payer: Self-pay | Admitting: Student

## 2022-04-01 VITALS — BP 114/60 | HR 73 | Ht 69.0 in | Wt 198.0 lb

## 2022-04-01 DIAGNOSIS — I25118 Atherosclerotic heart disease of native coronary artery with other forms of angina pectoris: Secondary | ICD-10-CM | POA: Diagnosis not present

## 2022-04-01 DIAGNOSIS — I8510 Secondary esophageal varices without bleeding: Secondary | ICD-10-CM | POA: Diagnosis not present

## 2022-04-01 DIAGNOSIS — K7460 Unspecified cirrhosis of liver: Secondary | ICD-10-CM

## 2022-04-01 DIAGNOSIS — R188 Other ascites: Secondary | ICD-10-CM

## 2022-04-01 MED ORDER — LACTULOSE 10 GM/15ML PO SOLN: 20.0000 g | Freq: Two times a day (BID) | ORAL | 0 refills | Status: DC

## 2022-04-01 NOTE — Telephone Encounter (Signed)
   [facility] TIPS post-procedure phone call follow-up  Matthew Ferguson is a 76 y.o. male who underwent TIPS creation at  Hospital 03/17/22 with Dr. Suttle    Patient is approximately two weeks out out from procedure.    # of paracentesis in last month: 2, last one was during TIPS # of paracentesis in last 2 months: 4   Current diuretic regimen: spironolactone 12.5 mg daily  Current pharmacologic encephalopathy prophylaxis/treatment: Lactulose 20 grams BID   Post-TIPS Imaging: None   Labs: 03/19/22 Creatinine: 1.81 Total Bilirubin: 1.8 INR: 1.5 Sodium: 133 Albumin: 2.6 Ammonia: n/a   Assessment: Matthew Ferguson is a 76 y.o. male with history of cirrhosis. Patient is approximately two weeks out from TIPS creation. I spoke with his wife on the phone today and she states he feels pretty well except his abdomen has become tight/distended and he needs to be drained. The patient met with Dr. Danis today (note in Epic) and Dr. Danis has ordered a paracentesis for 04/07/22 and a RUQ US to evaluate TIPS. The patient is scheduled for follow up with Dr. Suttle 04/17/22.    Recommendation:  Continue current treatment plan with next Clinic follow up scheduled for 04/17/22 with Dr. Suttle at the outpatient clinic.   Electronically Signed: Jamie R Covington, NP 04/01/2022, 3:36 PM   

## 2022-04-01 NOTE — Progress Notes (Signed)
[facility] Progress Note  Chief Complaint: Cirrhosis with refractory ascites  Subjective  History: I last saw Matthew Ferguson in the office on 01/02/2022 and he was struggling with large volume ascites with management made challenging by his CKD and relative hypotension.  He required subsequent therapeutic paracentesis and was identified by the IR department as a good candidate for TIPS.  I traded messages with Dr. Ruthann Cancer of IR on that subject, and they proceeded with further testing and eventually placed a TIPS on 03/17/2022.  I asked that this patient be put on lactulose after the procedure, which appears to have been done based on the discharge summary.  I also received a clinic follow-up message from Dr. Gean Quint of nephrology, who had just seen Matthew Ferguson for blood pressure and volume management.  He had concerns that fluid may be reaccumulating and he was checking some labs to see if low-dose furosemide might be resumed, possibly with the use of midodrine for blood pressure support.  Matthew Ferguson follows up today regarding his ascites.  He has progressive abdominal distention from ascites in the last 2 weeks.  He has generalized fatigue and dyspnea, no chest pain recently.  He ran out of lactulose last week, but before that was taking it twice a day and having 2-3 soft BMs per day. The ascites is causing bloating, though not to the degree it was when I last saw him. Review of systems: Appetite is picked up lately Remainder of systems negative except as above The patient's Past Medical, Family and Social History were reviewed and are on file in the EMR.  Objective:  Med list reviewed  Current Outpatient Medications:    acebutolol (SECTRAL) 200 MG capsule, Take 1 capsule (200 mg total) by mouth daily. (Patient taking differently: Take 200 mg by mouth every evening.), Disp: 90 capsule, Rfl: 3   aspirin 81 MG chewable tablet, Chew 1 tablet by mouth daily., Disp: , Rfl:    fluticasone (FLONASE)  50 MCG/ACT nasal spray, Place 1 spray into both nostrils as needed for congestion., Disp: , Rfl:    isosorbide mononitrate (IMDUR) 30 MG 24 hr tablet, Take 1 tablet (30 mg total) by mouth daily. (Patient taking differently: Take 30 mg by mouth every evening.), Disp: 90 tablet, Rfl: 3   Microlet Lancets MISC, , Disp: , Rfl:    neomycin-polymyxin-hydrocortisone (CORTISPORIN) OTIC solution, Apply 1 drop to both big toes at painful corners after bath/shower once a day until pain is better, Disp: 10 mL, Rfl: 0   nitroGLYCERIN (NITROSTAT) 0.4 MG SL tablet, DISSOLVE 1 TABLET UNDER THE TONGUE EVERY 5 MINUTES FOR UP TO 3 DOSES AS NEEDED FOR CHEST PAIN, Disp: 25 tablet, Rfl: 7   pantoprazole (PROTONIX) 40 MG tablet, Take 40 mg by mouth in the morning., Disp: , Rfl:    sertraline (ZOLOFT) 25 MG tablet, Take 25 mg by mouth in the morning., Disp: , Rfl:    lactulose (CHRONULAC) 10 GM/15ML solution, Take 30 mLs (20 g total) by mouth 2 (two) times daily., Disp: 1800 mL, Rfl: 0  Current Facility-Administered Medications:    albumin human 25 % solution 25 g, 25 g, Intravenous, Once, Danis, Estill Cotta III, MD   Vital signs in last 24 hrs: Vitals:   04/01/22 1050  BP: 114/60  Pulse: 73   Wt Readings from Last 3 Encounters:  04/01/22 198 lb (89.8 kg)  03/17/22 187 lb (84.8 kg)  03/12/22 188 lb 9.6 oz (85.5 kg)  Physical Exam His wife is present for the entire visit. Well-appearing, steady on his feet, fluent speech, normal mentation. HEENT: sclera anicteric, oral mucosa moist without lesions Neck: supple, no thyromegaly, JVD or lymphadenopathy Cardiac: Regular, no murmur, 2+ peripheral edema, left somewhat greater than right Pulm: clear to auscultation bilaterally, normal RR and effort noted Abdomen: soft, distended with ascites, positive fluid wave, no tenderness, with active bowel sounds.  (His abdomen is not tense, his degree of ascites not as severe as when I last saw him) Skin; warm and dry, no  jaundice or rash  Labs:   ___________________________________________ Radiologic studies:  TIPS placement report reviewed.  He also underwent paracentesis that day. ____________________________________________ Other:   _____________________________________________ Assessment & Plan  Assessment: Encounter Diagnoses  Name Primary?   Cirrhosis of liver with ascites, unspecified hepatic cirrhosis type ([place]) Yes   Secondary esophageal varices without bleeding ([facility])    Other ascites     Cryptogenic cirrhosis, suspected prior fatty liver, major complication has been refractory ascites with treatment challenging due to CKD and relative hypotension.  He has nonbleeding esophageal varices and internal hemorrhoids as well.  I have reassured them that it is still relatively soon after the TIPS placement to have seen the full effect on decreasing portal pressure and therefore ascites control.  He has reaccumulated significant fluid (and probably did not have a fully drained abdomen the day of placement), and he needs a therapeutic paracentesis for relief.  We have scheduled that for maximum of 5 L fluid removal along with administration of albumin 50 g IV. I will also request a right upper quadrant ultrasound with Doppler flow for TIPS evaluation at that time.  Matthew Ferguson and his wife tell me they are expecting a call soon from Dr. Serafina Royals to plan a visit with him and a lab draw the day before.  I will copy my note to Dr. Serafina Royals for an update and in case he wants to change the timing or other things related to the planned testing.  Note copy to Dr. Candiss Norse so he can coordinate follow-up with this patient and decision on possibly starting low-dose diuretic and perhaps midodrine as well.  2 g daily sodium restriction reinforced  Resume lactulose 20 g twice daily, titrating dose to get 2-3 soft BMs per day for encephalopathy prophylaxis  I also let them know that since this TIPS was successfully  placed, it would likely eliminate the upper GI varices and therefore the need for upper endoscopy with prophylactic banding.   32 minutes were spent on this encounter (including chart review, history/exam, counseling/coordination of care, and documentation) > 50% of that time was spent on counseling and coordination of care.   [name]

## 2022-04-01 NOTE — Patient Instructions (Signed)
If you are age 78 or older, your body mass index should be between 23-30. Your Body mass index is 29.24 kg/m. If this is out of the aforementioned range listed, please consider follow up with your Primary Care Provider.  If you are age 79 or younger, your body mass index should be between 19-25. Your Body mass index is 29.24 kg/m. If this is out of the aformentioned range listed, please consider follow up with your Primary Care Provider.   ________________________________________________________  The [facility] providers would like to encourage you to use [facility] to communicate with providers for non-urgent requests or questions.  Due to long hold times on the telephone, sending your provider a message by [facility] may be a faster and more efficient way to get a response.  Please allow 48 business hours for a response.  Please remember that this is for non-urgent requests.  _______________________________________________________  Dennis Bast have been scheduled for an abdominal ultrasound / paracentesis at [facility] (1st floor of hospital) on 04-07-2022 at [place]. Please arrive 15 minutes prior to your appointment for registration. Make certain not to have anything to eat or drink 6 hours prior to your appointment. Should you need to reschedule your appointment, please contact radiology at [phone]. This test typically takes about 30 minutes to perform.   It was a pleasure to see you today!  Thank you for trusting me with your gastrointestinal care!

## 2022-04-07 ENCOUNTER — Ambulatory Visit (HOSPITAL_COMMUNITY)
Admission: RE | Admit: 2022-04-07 | Discharge: 2022-04-07 | Disposition: A | Discharge status: Home / Self Care | Payer: Medicare Other | Source: Ambulatory Visit | Attending: Gastroenterology | Admitting: Gastroenterology

## 2022-04-07 ENCOUNTER — Encounter (HOSPITAL_COMMUNITY): Payer: Self-pay

## 2022-04-07 DIAGNOSIS — I8510 Secondary esophageal varices without bleeding: Secondary | ICD-10-CM | POA: Diagnosis not present

## 2022-04-07 DIAGNOSIS — R188 Other ascites: Secondary | ICD-10-CM | POA: Insufficient documentation

## 2022-04-07 DIAGNOSIS — K7460 Unspecified cirrhosis of liver: Secondary | ICD-10-CM | POA: Insufficient documentation

## 2022-04-07 DIAGNOSIS — N189 Chronic kidney disease, unspecified: Secondary | ICD-10-CM | POA: Diagnosis not present

## 2022-04-07 NOTE — Procedures (Signed)
Ultrasound-guided therapeutic paracentesis performed yielding 4.4 liters of yellow  fluid. No immediate complications. EBL none.

## 2022-04-09 ENCOUNTER — Ambulatory Visit (HOSPITAL_COMMUNITY): Payer: Medicare Other

## 2022-04-15 ENCOUNTER — Ambulatory Visit (HOSPITAL_COMMUNITY)
Admission: RE | Admit: 2022-04-15 | Discharge: 2022-04-15 | Disposition: A | Discharge status: Home / Self Care | Payer: Medicare Other | Source: Ambulatory Visit | Attending: Gastroenterology | Admitting: Gastroenterology

## 2022-04-15 DIAGNOSIS — K7460 Unspecified cirrhosis of liver: Secondary | ICD-10-CM | POA: Insufficient documentation

## 2022-04-15 DIAGNOSIS — R188 Other ascites: Secondary | ICD-10-CM | POA: Diagnosis not present

## 2022-04-15 DIAGNOSIS — I8510 Secondary esophageal varices without bleeding: Secondary | ICD-10-CM | POA: Insufficient documentation

## 2022-04-15 DIAGNOSIS — K7689 Other specified diseases of liver: Secondary | ICD-10-CM | POA: Diagnosis not present

## 2022-04-17 ENCOUNTER — Ambulatory Visit
Admission: RE | Admit: 2022-04-17 | Discharge: 2022-04-17 | Disposition: A | Discharge status: Home / Self Care | Payer: Medicare Other | Source: Ambulatory Visit | Attending: Radiology | Admitting: Radiology

## 2022-04-17 ENCOUNTER — Encounter: Payer: Self-pay | Admitting: *Deleted

## 2022-04-17 ENCOUNTER — Other Ambulatory Visit: Payer: Self-pay | Admitting: *Deleted

## 2022-04-17 DIAGNOSIS — Z95828 Presence of other vascular implants and grafts: Secondary | ICD-10-CM

## 2022-04-17 DIAGNOSIS — R188 Other ascites: Secondary | ICD-10-CM

## 2022-04-17 DIAGNOSIS — K766 Portal hypertension: Secondary | ICD-10-CM | POA: Diagnosis not present

## 2022-04-17 DIAGNOSIS — K7469 Other cirrhosis of liver: Secondary | ICD-10-CM | POA: Diagnosis not present

## 2022-04-17 DIAGNOSIS — Z9689 Presence of other specified functional implants: Secondary | ICD-10-CM | POA: Diagnosis not present

## 2022-04-17 HISTORY — PX: IR RADIOLOGIST EVAL & MGMT: IMG5224

## 2022-04-17 NOTE — Progress Notes (Signed)
Referring Physician(s): Wilfrid Lund, MD  Reason for follow up:  1 month status post TIPS creation  History of present illness: 76 y.o. male with history of cryptogenic cirrhosis (Child Pugh B, MELD 16) with portal hypertension and recurrent ascites in the setting of decreased diuresis limited by renal function.  He is status post TIPS creation on 03/17/22 which was uncomplicated and achieved a portosystemic mean pressure gradient reduction from 15 to 7 mmHg without post-deployment balloon dilation.   He presents today via telephone virtual clinic visit.  He states that he is feeling better.  He continues to produce ascites, requiring a paracentesis on 6/5 yielding 4.4 L.  He feels that he may need another paracentesis soon.  He complains of some bladder intolerance since the procedure requiring him to wear adult diapers, that has improved slightly over the past couple weeks.  He and his wife report no hepatic encephalopathy and compliance with lactulose, which he has titrated to 2-3 bowel movements per day with a single dose each night.  He is no longer taking spironolactone.  We requested labs prior to the visit today but this has not been done.  He met with Dr. Loletha Carrow on 5/30 who helped to coordinate recent paracentesis and Nephrology follow up.     Past Medical History:  Diagnosis Date   Ascites    Chronic kidney disease    Cirrhosis ([place])    Complication of anesthesia    Post operative vomiting for "2 weeks" per pt after cholecystectomy   Coronary artery disease involving native coronary artery 05/29/2015   05/08/12 had PCI and Xience stent to RCA for Troponin normal unstable angina with residual 40-50% proximal and 30-40 % mid LAD stenosis, EF normal 55-60%   Depression    Essential hypertension 05/29/2015   GERD (gastroesophageal reflux disease)    Hepatitis-C 03/31/2021   undetectable virus on 03/31/2021.     Irritable bowel syndrome    Mixed hyperlipidemia 05/29/2015    Palpitations    PONV (postoperative nausea and vomiting)    Tinnitus    Type 2 diabetes mellitus ([place]) 05/29/2015    Past Surgical History:  Procedure Laterality Date   BACK SURGERY     CARDIAC CATHETERIZATION     CHOLECYSTECTOMY     COLONOSCOPY WITH PROPOFOL N/A 04/02/2021   Procedure: COLONOSCOPY WITH PROPOFOL;  Surgeon: Irene Shipper, MD;  Location: [facility];  Service: Endoscopy;  Laterality: N/A;   CORONARY ANGIOPLASTY WITH STENT PLACEMENT     ESOPHAGOGASTRODUODENOSCOPY (EGD) WITH PROPOFOL N/A 04/01/2021   Procedure: ESOPHAGOGASTRODUODENOSCOPY (EGD) WITH PROPOFOL;  Surgeon: Doran Stabler, MD;  Location: [place];  Service: Gastroenterology;  Laterality: N/A;   INGUINAL HERNIA REPAIR Bilateral 01/2021   IR INTRAVASCULAR ULTRASOUND NON CORONARY  03/17/2022   IR PARACENTESIS  10/17/2021   IR PARACENTESIS  01/24/2022   IR PARACENTESIS  02/19/2022   IR PARACENTESIS  03/05/2022   IR PARACENTESIS  03/17/2022   IR RADIOLOGIST EVAL & MGMT  02/24/2022   IR TIPS  03/17/2022   IR US GUIDE VASC ACCESS RIGHT  03/17/2022   KNEE SURGERY     RADIOLOGY WITH ANESTHESIA N/A 03/17/2022   Procedure: TIPS;  Surgeon: Suzette Battiest, MD;  Location: [place];  Service: Radiology;  Laterality: N/A;    Allergies: Patient has no known allergies.  Medications: Prior to Admission medications   Medication Sig Start Date End Date Taking? Authorizing Provider  acebutolol (SECTRAL) 200 MG capsule Take 1 capsule (200  mg total) by mouth daily. Patient taking differently: Take 200 mg by mouth every evening. 02/11/22   Richardo Priest, MD  aspirin 81 MG chewable tablet Chew 1 tablet by mouth daily.    [provider]  fluticasone (FLONASE) 50 MCG/ACT nasal spray Place 1 spray into both nostrils as needed for congestion. 02/12/21   [provider]  isosorbide mononitrate (IMDUR) 30 MG 24 hr tablet Take 1 tablet (30 mg total) by mouth daily. Patient taking differently: Take 30 mg by mouth every  evening. 07/03/21 07/05/27  Richardo Priest, MD  lactulose (CHRONULAC) 10 GM/15ML solution Take 30 mLs (20 g total) by mouth 2 (two) times daily. 04/01/22   Doran Stabler, MD  Microlet Lancets MISC  11/22/21   [provider]  neomycin-polymyxin-hydrocortisone (CORTISPORIN) OTIC solution Apply 1 drop to both big toes at painful corners after bath/shower once a day until pain is better 03/11/22   Landis Martins, DPM  nitroGLYCERIN (NITROSTAT) 0.4 MG SL tablet DISSOLVE 1 TABLET UNDER THE TONGUE EVERY 5 MINUTES FOR UP TO 3 DOSES AS NEEDED FOR CHEST PAIN 04/30/21   Richardo Priest, MD  pantoprazole (PROTONIX) 40 MG tablet Take 40 mg by mouth in the morning.    [provider]  sertraline (ZOLOFT) 25 MG tablet Take 25 mg by mouth in the morning. 10/02/21   [provider]     Family History  Problem Relation Age of Onset   Other Mother        died with old age   Stroke Father    Diabetes Father    Heart disease Father    Congenital heart disease Daughter    Colon cancer Neg Hx    Esophageal cancer Neg Hx    Pancreatic cancer Neg Hx    Stomach cancer Neg Hx     Social History   Socioeconomic History   Marital status: Married    Spouse name: Electrical engineer   Number of children: 2   Years of education: Not on file   Highest education level: Not on file  Occupational History   Occupation: retired  Tobacco Use   Smoking status: Never    Passive exposure: Past   Smokeless tobacco: Never  Vaping Use   Vaping Use: Never used  Substance and Sexual Activity   Alcohol use: Never   Drug use: Never   Sexual activity: Not on file  Other Topics Concern   Not on file  Social History Narrative   Not on file   Social Determinants of Health   Financial Resource Strain: Not on file  Food Insecurity: Not on file  Transportation Needs: Not on file  Physical Activity: Not on file  Stress: Not on file  Social Connections: Not on file     Vital Signs: There were no vitals  taken for this visit.  No physical examination was performed in lieu of virtual telephone clinic visit.  Imaging: TIPS creation 03/17/22:  8+2 Viatorr Portosystemic gradient of 15 mm Hg (absolute portal venous pressure 28 mm Hg) before shunt placement and 7 mm Hg (absolute portal venous pressure 19 mm Hg) after shunt placement.    US LIVER DOPPLER  Result Date: 04/15/2022 CLINICAL DATA:  Cirrhosis, status post TIPS creation 03/17/2022 EXAM: DUPLEX ULTRASOUND OF LIVER AND TIPS SHUNT TECHNIQUE: Color and duplex Doppler ultrasound was performed to evaluate the hepatic in-flow and out-flow vessels. COMPARISON:  03/17/2022 FINDINGS: Portal Vein Velocities Main:  26 cm/sec Right:  41  cm/sec Left:  31 cm/sec TIPS Stent Velocities Proximal:  46 cm/sec Mid:  113 Distal:  97 cm/sec IVC: Present and patent with normal respiratory phasicity. Hepatic Vein Velocities Right:  16 cm/sec Mid:  16 cm/sec Left:  15 cm/sec Splenic Vein: 25 centimeters/seconds Superior Mesenteric Vein: 34 centimeters/seconds Hepatic Artery: 78 centimeters/second Ascites: Present Varices: Not visualized Other findings: Heterogeneous nodular liver compatible with cirrhosis. Small to moderate volume of perihepatic ascites. Portal vein, newly created TIPS, and hepatic veins are all patent with normal directional flow. No significant waveform or velocity abnormality. IMPRESSION: Newly created TIPS appears patent with normal directional flow. Hepatic cirrhosis Moderate volume of ascites Electronically Signed   By: Jerilynn Mages.  Shick M.D.   On: 04/15/2022 10:58    Labs:  CBC: Recent Labs    10/15/21 1507 03/12/22 1504 03/18/22 0113 03/19/22 0557  WBC 5.5 6.5 9.6 11.7*  HGB 11.3* 9.3* 10.6* 9.7*  HCT 34.0* 28.3* 31.3* 28.9*  PLT 102.0* 134* 133* 139*    COAGS: Recent Labs    10/15/21 1507 03/17/22 0753 03/18/22 0113 03/19/22 0557  INR 1.4* 1.3* 1.4* 1.5*    BMP: Recent Labs    10/15/21 1507 03/07/22 1356 03/12/22 1504  03/18/22 0113 03/19/22 0557  NA 136  --  137 133* 133*  K 3.8  --  4.1 4.5 4.0  CL 105  --  110 104 104  CO2 26  --  22 17* 21*  GLUCOSE 131*  --  212* 177* 172*  BUN 12  --  16 15 24*  CALCIUM 8.7  --  8.6* 8.6* 8.5*  CREATININE 1.60* 1.90* 1.79* 1.54* 1.81*  GFRNONAA  --   --  39* 46* 38*    LIVER FUNCTION TESTS: Recent Labs    10/15/21 1507 03/12/22 1504 03/18/22 0113 03/19/22 0557  BILITOT 1.5* 0.8 2.5* 1.8*  AST 17 20 83* 210*  ALT 8 12 55* 153*  ALKPHOS 92 92 107 107  PROT 6.6 6.2* 6.1* 5.5*  ALBUMIN 3.2* 2.8* 2.8* 2.6*    Assessment and Plan: 76 y.o. male with history of cryptogenic cirrhosis (Child Pugh B, MELD 16) with portal hypertension and recurrent ascites in the setting of decreased diuresis limited by renal function.  He is recovering well status post TIPS creation on 03/17/22 which was uncomplicated and achieved a portosystemic mean pressure gradient reduction from 15 to 7 mmHg without post-deployment balloon dilation., though still has recurrent ascites.  -obtain CBC, CMP, INR soon - prefers [facility] in [place] -agree with continued paracentesis as needed -continue single dose of lactulose each day -defer diuretic management to Nephrology - appointment forthcoming later this month -follow up in 1 month - at this time we will assess interval ascites production and have low threshold for TIPS check with possible balloon expansion of TIPS if ascites not significant improved  Ruthann Cancer, MD Pager: [phone] Clinic: [phone]    I spent a total of 40 Minutes in telephone clinical consultation, greater than 50% of which was counseling/coordinating care for portal hypertension.

## 2022-04-25 DIAGNOSIS — K7469 Other cirrhosis of liver: Secondary | ICD-10-CM | POA: Diagnosis not present

## 2022-04-25 DIAGNOSIS — R188 Other ascites: Secondary | ICD-10-CM | POA: Diagnosis not present

## 2022-04-25 DIAGNOSIS — N1832 Chronic kidney disease, stage 3b: Secondary | ICD-10-CM | POA: Diagnosis not present

## 2022-04-28 ENCOUNTER — Other Ambulatory Visit (HOSPITAL_COMMUNITY): Payer: Self-pay | Admitting: *Deleted

## 2022-04-29 ENCOUNTER — Ambulatory Visit (HOSPITAL_COMMUNITY)
Admission: RE | Admit: 2022-04-29 | Discharge: 2022-04-29 | Disposition: A | Discharge status: Home / Self Care | Payer: Medicare Other | Source: Ambulatory Visit | Attending: Nephrology | Admitting: Nephrology

## 2022-04-29 DIAGNOSIS — N189 Chronic kidney disease, unspecified: Secondary | ICD-10-CM | POA: Insufficient documentation

## 2022-04-29 DIAGNOSIS — D631 Anemia in chronic kidney disease: Secondary | ICD-10-CM | POA: Diagnosis not present

## 2022-04-29 MED ORDER — SODIUM CHLORIDE 0.9 % IV SOLN
750.0000 mg | INTRAVENOUS | Status: DC
  Administered 2022-04-29: 750 mg via INTRAVENOUS
  Filled 2022-04-29: qty 15

## 2022-05-01 ENCOUNTER — Telehealth: Payer: Self-pay | Admitting: Gastroenterology

## 2022-05-01 ENCOUNTER — Other Ambulatory Visit: Payer: Self-pay | Admitting: Interventional Radiology

## 2022-05-01 ENCOUNTER — Other Ambulatory Visit: Payer: Self-pay

## 2022-05-01 DIAGNOSIS — D631 Anemia in chronic kidney disease: Secondary | ICD-10-CM | POA: Diagnosis not present

## 2022-05-01 DIAGNOSIS — E8809 Other disorders of plasma-protein metabolism, not elsewhere classified: Secondary | ICD-10-CM | POA: Diagnosis not present

## 2022-05-01 DIAGNOSIS — I129 Hypertensive chronic kidney disease with stage 1 through stage 4 chronic kidney disease, or unspecified chronic kidney disease: Secondary | ICD-10-CM | POA: Diagnosis not present

## 2022-05-01 DIAGNOSIS — E871 Hypo-osmolality and hyponatremia: Secondary | ICD-10-CM | POA: Diagnosis not present

## 2022-05-01 DIAGNOSIS — K7460 Unspecified cirrhosis of liver: Secondary | ICD-10-CM | POA: Diagnosis not present

## 2022-05-01 DIAGNOSIS — N1832 Chronic kidney disease, stage 3b: Secondary | ICD-10-CM | POA: Diagnosis not present

## 2022-05-01 DIAGNOSIS — R609 Edema, unspecified: Secondary | ICD-10-CM | POA: Diagnosis not present

## 2022-05-01 DIAGNOSIS — K7469 Other cirrhosis of liver: Secondary | ICD-10-CM

## 2022-05-01 DIAGNOSIS — E1122 Type 2 diabetes mellitus with diabetic chronic kidney disease: Secondary | ICD-10-CM | POA: Diagnosis not present

## 2022-05-01 DIAGNOSIS — N2581 Secondary hyperparathyroidism of renal origin: Secondary | ICD-10-CM | POA: Diagnosis not present

## 2022-05-01 NOTE — Telephone Encounter (Signed)
Inbound call from patient stating he is in need of a paracentesis. Please advise.

## 2022-05-01 NOTE — Telephone Encounter (Signed)
Dr. Tarri Glenn as DOD PM on 05/01/22  [name] patient with a hx of cirrhosis of the liver, s/p TIPS on 03/17/22  Returned call to patient. He states that he did OK for about a week after his TIPS procedure from 03/17/22. Pt thinks that he has gained about 30 lbs over the last month, or month and a half. Pt reports that his abdomen is firm and full of fluid. Pt reports swelling in his ankles and feet as well. Pt reports that he is following his 2 g sodium restriction diet. Pt is not on any diuretics. Pt denies any SOB. Pt requesting a paracentesis. Pt is driving from [place] and is requesting an appt between 9am-12pm. Please advise, thanks.

## 2022-05-01 NOTE — Telephone Encounter (Signed)
Patient has been scheduled for a paracentesis at [facility] on Monday, 05/05/22 at 2 pm. Pt would need to arrive at [facility] by 1:30 pm. Patient has been scheduled for a f/u with Dr. Loletha Carrow on Tuesday, 05/13/22 at 3:40 pm.   CMET order in epic.  Called and spoke with patient regarding Dr. Payton Emerald recommendations as outlined below. Pt has been advised of his paracentesis appt and f/u appt information. He is also aware that his appt information is available in MyChart. Pt reports that he recently had labs drawn at his nephrologist office. Pt reports that he sees Dr. Candiss Norse at [facility]. I told pt that I will call Dr. Keturah Barre office.   Called and spoke with Lattie Haw at Dr. Keturah Barre office. She reports that pt last had a CMET on 03/26/22, on 6/23 he had a CBC and renal panel. Lattie Haw will fax results to my attention.  I called the pt back and informed him that he will still need to come by for labs tomorrow. Pt is aware that he can stop by at his convenience tomorrow before 5 pm. Pt verbalized understanding and had no concerns at the end of the call.

## 2022-05-01 NOTE — Addendum Note (Signed)
Addended by: Yevette Edwards on: 05/01/2022 04:05 PM   Modules accepted: Orders

## 2022-05-02 ENCOUNTER — Other Ambulatory Visit (INDEPENDENT_AMBULATORY_CARE_PROVIDER_SITE_OTHER): Payer: Medicare Other

## 2022-05-02 DIAGNOSIS — R188 Other ascites: Secondary | ICD-10-CM

## 2022-05-02 DIAGNOSIS — K7460 Unspecified cirrhosis of liver: Secondary | ICD-10-CM | POA: Diagnosis not present

## 2022-05-02 LAB — COMPREHENSIVE METABOLIC PANEL
ALT: 11 U/L (ref 0–53)
AST: 24 U/L (ref 0–37)
Albumin: 2.7 g/dL — ABNORMAL LOW (ref 3.5–5.2)
Alkaline Phosphatase: 150 U/L — ABNORMAL HIGH (ref 39–117)
BUN: 16 mg/dL (ref 6–23)
CO2: 26 mEq/L (ref 19–32)
Calcium: 8.3 mg/dL — ABNORMAL LOW (ref 8.4–10.5)
Chloride: 105 mEq/L (ref 96–112)
Creatinine, Ser: 1.59 mg/dL — ABNORMAL HIGH (ref 0.40–1.50)
GFR: 41.55 mL/min — ABNORMAL LOW (ref >60.00)
Glucose, Bld: 198 mg/dL — ABNORMAL HIGH (ref 70–99)
Potassium: 4.2 mEq/L (ref 3.5–5.1)
Sodium: 136 mEq/L (ref 135–145)
Total Bilirubin: 0.9 mg/dL (ref 0.2–1.2)
Total Protein: 6 g/dL (ref 6.0–8.3)

## 2022-05-02 NOTE — Telephone Encounter (Signed)
Records received from [facility]. Labs placed in Dr. Loletha Carrow' Gillis basket for his review when he returns to the office next week.

## 2022-05-05 ENCOUNTER — Ambulatory Visit (HOSPITAL_COMMUNITY)
Admission: RE | Admit: 2022-05-05 | Discharge: 2022-05-05 | Disposition: A | Discharge status: Home / Self Care | Payer: Medicare Other | Source: Ambulatory Visit | Attending: Gastroenterology | Admitting: Gastroenterology

## 2022-05-05 DIAGNOSIS — R188 Other ascites: Secondary | ICD-10-CM | POA: Insufficient documentation

## 2022-05-05 DIAGNOSIS — K7460 Unspecified cirrhosis of liver: Secondary | ICD-10-CM | POA: Insufficient documentation

## 2022-05-05 HISTORY — PX: IR PARACENTESIS: IMG2679

## 2022-05-05 LAB — BODY FLUID CELL COUNT WITH DIFFERENTIAL
Eos, Fluid: 0 %
Lymphs, Fluid: 53 %
Monocyte-Macrophage-Serous Fluid: 45 % — ABNORMAL LOW (ref 50–90)
Neutrophil Count, Fluid: 1 % (ref 0–25)
Total Nucleated Cell Count, Fluid: 118 cu mm (ref 0–1000)

## 2022-05-05 LAB — ALBUMIN, PLEURAL OR PERITONEAL FLUID: Albumin, Fluid: <1.5 g/dL

## 2022-05-05 LAB — PROTEIN, PLEURAL OR PERITONEAL FLUID: Total protein, fluid: <3 g/dL

## 2022-05-05 MED ORDER — LIDOCAINE HCL (PF) 1 % IJ SOLN
INTRAMUSCULAR | Status: DC | PRN
  Administered 2022-05-05: 10 mL

## 2022-05-05 MED ORDER — LIDOCAINE HCL 1 % IJ SOLN
INTRAMUSCULAR | Status: AC
  Filled 2022-05-05: qty 20

## 2022-05-05 NOTE — Procedures (Signed)
PROCEDURE SUMMARY:  Prior TIPS procedure 03/2022 presents with recurrent ascites. Successful US guided paracentesis from RLQ.  Yielded 3.2L of clear yellow fluid.  No immediate complications.  Pt tolerated well.   Specimen was sent for labs.  EBL < 41m  Taquila Leys PA-C 05/05/2022 3:58 PM

## 2022-05-07 DIAGNOSIS — E1169 Type 2 diabetes mellitus with other specified complication: Secondary | ICD-10-CM | POA: Diagnosis not present

## 2022-05-07 DIAGNOSIS — I129 Hypertensive chronic kidney disease with stage 1 through stage 4 chronic kidney disease, or unspecified chronic kidney disease: Secondary | ICD-10-CM | POA: Diagnosis not present

## 2022-05-07 DIAGNOSIS — E782 Mixed hyperlipidemia: Secondary | ICD-10-CM | POA: Diagnosis not present

## 2022-05-07 LAB — PATHOLOGIST SMEAR REVIEW: Path Review: NEGATIVE

## 2022-05-07 NOTE — Telephone Encounter (Signed)
Noted, thanks!

## 2022-05-07 NOTE — Telephone Encounter (Signed)
Thank you for attending to this while I was out of the office.  I am aware that he has continued to struggle with volume overload requiring paracentesis even after TIPS. I agree with the paracentesis, and management of any diuretics must be under the direction of his nephrologist Dr. Candiss Norse.  I will copy this to his interventional radiologist Dr. Serafina Royals and message Dr. Candiss Norse as well for care coordination.  - HD

## 2022-05-08 ENCOUNTER — Ambulatory Visit (HOSPITAL_COMMUNITY)
Admission: RE | Admit: 2022-05-08 | Discharge: 2022-05-08 | Disposition: A | Discharge status: Home / Self Care | Payer: Medicare Other | Source: Ambulatory Visit | Attending: Nephrology | Admitting: Nephrology

## 2022-05-08 DIAGNOSIS — N189 Chronic kidney disease, unspecified: Secondary | ICD-10-CM | POA: Insufficient documentation

## 2022-05-08 DIAGNOSIS — D631 Anemia in chronic kidney disease: Secondary | ICD-10-CM | POA: Insufficient documentation

## 2022-05-08 MED ORDER — SODIUM CHLORIDE 0.9 % IV SOLN
750.0000 mg | INTRAVENOUS | Status: DC
  Administered 2022-05-08: 750 mg via INTRAVENOUS
  Filled 2022-05-08: qty 15

## 2022-05-08 NOTE — Progress Notes (Signed)
Pt receiving Injectafer.  Had received 261m of 2654mbag and then began to c/o nausea and lightheadedness.  Med stopped.  VSS remain stable.  Will monitor pt for 30 minutes.  [employer]

## 2022-05-13 ENCOUNTER — Encounter: Payer: Self-pay | Admitting: Gastroenterology

## 2022-05-13 ENCOUNTER — Ambulatory Visit (INDEPENDENT_AMBULATORY_CARE_PROVIDER_SITE_OTHER): Payer: Medicare Other | Admitting: Gastroenterology

## 2022-05-13 VITALS — BP 128/78 | HR 66 | Ht 69.0 in | Wt 178.0 lb

## 2022-05-13 DIAGNOSIS — I25118 Atherosclerotic heart disease of native coronary artery with other forms of angina pectoris: Secondary | ICD-10-CM

## 2022-05-13 DIAGNOSIS — I8510 Secondary esophageal varices without bleeding: Secondary | ICD-10-CM

## 2022-05-13 DIAGNOSIS — K7460 Unspecified cirrhosis of liver: Secondary | ICD-10-CM | POA: Diagnosis not present

## 2022-05-13 DIAGNOSIS — R188 Other ascites: Secondary | ICD-10-CM

## 2022-05-13 DIAGNOSIS — D631 Anemia in chronic kidney disease: Secondary | ICD-10-CM

## 2022-05-13 DIAGNOSIS — N189 Chronic kidney disease, unspecified: Secondary | ICD-10-CM

## 2022-05-13 NOTE — Progress Notes (Signed)
[facility] Progress Note  Chief Complaint: Large volume ascites  Subjective  History:  Matthew Ferguson follows up for his cirrhosis and large volume ascites.  Despite having undergone TIPS placement in May, he has had significant reaccumulation of ascites requiring therapeutic paracentesis on June 5 (4.4 L) and July 3 (3.2 L) This was presumably as much fluid as could be removed.  He has had follow-up with Dr. Candiss Norse of nephrology, who is note with lab reports I recently reviewed.  Dr. Candiss Norse is cautiously trying to reintroduce low-dose diuretics which is challenging due to Matthew Ferguson's CKD and hypotension.  IR clinic follow-up is also planned, and in my communications with Dr. Serafina Royals, he was considering a potential upsizing of the TIPS.  There was reportedly good portal and hepatic venous flow on Doppler studies from 13th  Matthew Ferguson is with his wife today, and they both feel he is improving and the last couple of weeks.  Even with less fluid removed on the most recent paracentesis, feels his abdominal distention and peripheral edema have continued to improve, and feels that the TIPS is starting to well.  He received iron infusions through his nephrologist, first 1 went well but  second was stopped early due to possible reaction.  ROS: Cardiovascular:  no chest pain Respiratory: no dyspnea Energy level and mood reportedly improved lately  The patient's Past Medical, Family and Social History were reviewed and are on file in the EMR.  Objective:  Med list reviewed  Current Outpatient Medications:    acebutolol (SECTRAL) 200 MG capsule, Take 1 capsule (200 mg total) by mouth daily. (Patient taking differently: Take 200 mg by mouth every evening.), Disp: 90 capsule, Rfl: 3   aspirin 81 MG chewable tablet, Chew 1 tablet by mouth daily., Disp: , Rfl:    fluticasone (FLONASE) 50 MCG/ACT nasal spray, Place 1 spray into both nostrils as needed for congestion., Disp: , Rfl:    isosorbide mononitrate  (IMDUR) 30 MG 24 hr tablet, Take 1 tablet (30 mg total) by mouth daily. (Patient taking differently: Take 30 mg by mouth every evening.), Disp: 90 tablet, Rfl: 3   lactulose (CHRONULAC) 10 GM/15ML solution, Take 30 mLs (20 g total) by mouth 2 (two) times daily., Disp: 1800 mL, Rfl: 0   Microlet Lancets MISC, , Disp: , Rfl:    neomycin-polymyxin-hydrocortisone (CORTISPORIN) OTIC solution, Apply 1 drop to both big toes at painful corners after bath/shower once a day until pain is better, Disp: 10 mL, Rfl: 0   nitroGLYCERIN (NITROSTAT) 0.4 MG SL tablet, DISSOLVE 1 TABLET UNDER THE TONGUE EVERY 5 MINUTES FOR UP TO 3 DOSES AS NEEDED FOR CHEST PAIN, Disp: 25 tablet, Rfl: 7   pantoprazole (PROTONIX) 40 MG tablet, Take 40 mg by mouth in the morning., Disp: , Rfl:    sertraline (ZOLOFT) 25 MG tablet, Take 25 mg by mouth in the morning., Disp: , Rfl:    Vital signs in last 24 hrs: Vitals:   05/13/22 1545  BP: 128/78  Pulse: 66  SpO2: 99%   Wt Readings from Last 3 Encounters:  05/13/22 178 lb (80.7 kg)  04/01/22 198 lb (89.8 kg)  03/17/22 187 lb (84.8 kg)    Physical Exam  Slow antalgic gait as before, but his affect looks brighter than when I last saw him. HEENT: sclera anicteric, oral mucosa moist without lesions Neck: supple, no thyromegaly, JVD or lymphadenopathy Cardiac: Regular without murmur,  no peripheral edema Pulm: clear to auscultation bilaterally, normal RR  and effort noted Abdomen: soft, mild upper tenderness as before, with active bowel sounds.  No distention Skin; warm and dry, no jaundice or rash  Labs:   ___________________________________________ Radiologic studies:   ____________________________________________ Other:   _____________________________________________ Assessment & Plan  Assessment: Encounter Diagnoses  Name Primary?   Cirrhosis of liver with ascites, unspecified hepatic cirrhosis type ([place]) Yes   Secondary esophageal varices without bleeding  ([facility])    Other ascites    Anemia in chronic kidney disease, unspecified CKD stage    Cryptogenic cirrhosis, probable fatty liver.  Large volume ascites with treatment limited by CKD and relative hypotension.  He finally seems to be improving after TIPS placement.  Hopefully the vascular physiology of portal hypertension now starting to improve. He has a phone call follow-up with IR tomorrow, and says he has labs planned with nephrology for later this week.  With that him sure his hemoglobin will be followed and determination made on whether or not he needs further iron.   Plan: Matthew Ferguson will otherwise plan to see me in 3 months or call sooner if needed, particularly if he has fluid reaccumulation requiring paracentesis.    Matthew Ferguson III

## 2022-05-13 NOTE — Patient Instructions (Signed)
If you are age 78 or older, your body mass index should be between 23-30. Your Body mass index is 26.29 kg/m. If this is out of the aforementioned range listed, please consider follow up with your Primary Care Provider.  If you are age 25 or younger, your body mass index should be between 19-25. Your Body mass index is 26.29 kg/m. If this is out of the aformentioned range listed, please consider follow up with your Primary Care Provider.   ________________________________________________________  The [facility] providers would like to encourage you to use [facility] to communicate with providers for non-urgent requests or questions.  Due to long hold times on the telephone, sending your provider a message by [facility] may be a faster and more efficient way to get a response.  Please allow 48 business hours for a response.  Please remember that this is for non-urgent requests.  _______________________________________________________  Please follow up in 3 months. We have placed a reminder in our system.  It was a pleasure to see you today!  Thank you for trusting me with your gastrointestinal care!

## 2022-05-14 ENCOUNTER — Encounter: Payer: Self-pay | Admitting: *Deleted

## 2022-05-14 ENCOUNTER — Ambulatory Visit
Admission: RE | Admit: 2022-05-14 | Discharge: 2022-05-14 | Disposition: A | Discharge status: Home / Self Care | Payer: Medicare Other | Source: Ambulatory Visit | Attending: Interventional Radiology | Admitting: Interventional Radiology

## 2022-05-14 DIAGNOSIS — K7469 Other cirrhosis of liver: Secondary | ICD-10-CM

## 2022-05-14 DIAGNOSIS — K766 Portal hypertension: Secondary | ICD-10-CM | POA: Diagnosis not present

## 2022-05-14 DIAGNOSIS — N1832 Chronic kidney disease, stage 3b: Secondary | ICD-10-CM | POA: Diagnosis not present

## 2022-05-14 DIAGNOSIS — Z9689 Presence of other specified functional implants: Secondary | ICD-10-CM | POA: Diagnosis not present

## 2022-05-14 DIAGNOSIS — R188 Other ascites: Secondary | ICD-10-CM | POA: Diagnosis not present

## 2022-05-14 DIAGNOSIS — K7460 Unspecified cirrhosis of liver: Secondary | ICD-10-CM | POA: Diagnosis not present

## 2022-05-14 HISTORY — PX: IR RADIOLOGIST EVAL & MGMT: IMG5224

## 2022-05-14 NOTE — Progress Notes (Signed)
Referring Physician(s): Wilfrid Lund, MD   Reason for follow up:  1 month status post TIPS creation   History of present illness: 76 y.o. male with history of cryptogenic cirrhosis (Child Pugh B, MELD 16) with portal hypertension and recurrent ascites in the setting of decreased diuresis limited by renal function.  He is status post TIPS creation on 03/17/22 which was uncomplicated and achieved a portosystemic mean pressure gradient reduction from 15 to 7 mmHg without post-deployment balloon dilation.    He presents today via telephone virtual clinic visit.  He states that he is feeling much better.  He continues to produce ascites, requiring a paracentesis on 6/5 yielding 4.4 L and again on 7/3 yielding 3.2 L.  He states that he doesn't feel like he's re-accumulated any fluid since then.  He continues to report no hepatic encephalopathy and compliance with lactulose, which he has titrated to 2-3 bowel movements per day with a single dose each night.  He is no longer taking spironolactone.  He recently saw his Nephrologist who is managing his diuretics.  He got labs recently.       Past Medical History:  Diagnosis Date   Ascites    Chronic kidney disease    Cirrhosis ([place])    Complication of anesthesia    Post operative vomiting for "2 weeks" per pt after cholecystectomy   Coronary artery disease involving native coronary artery 05/29/2015   05/08/12 had PCI and Xience stent to RCA for Troponin normal unstable angina with residual 40-50% proximal and 30-40 % mid LAD stenosis, EF normal 55-60%   Depression    Essential hypertension 05/29/2015   GERD (gastroesophageal reflux disease)    Hepatitis-C 03/31/2021   undetectable virus on 03/31/2021.     Irritable bowel syndrome    Mixed hyperlipidemia 05/29/2015   Palpitations    PONV (postoperative nausea and vomiting)    Tinnitus    Type 2 diabetes mellitus ([place]) 05/29/2015    Past Surgical History:  Procedure Laterality Date    BACK SURGERY     CARDIAC CATHETERIZATION     CHOLECYSTECTOMY     COLONOSCOPY WITH PROPOFOL N/A 04/02/2021   Procedure: COLONOSCOPY WITH PROPOFOL;  Surgeon: Irene Shipper, MD;  Location: [facility] ENDOSCOPY;  Service: Endoscopy;  Laterality: N/A;   CORONARY ANGIOPLASTY WITH STENT PLACEMENT     ESOPHAGOGASTRODUODENOSCOPY (EGD) WITH PROPOFOL N/A 04/01/2021   Procedure: ESOPHAGOGASTRODUODENOSCOPY (EGD) WITH PROPOFOL;  Surgeon: Doran Stabler, MD;  Location: [facility];  Service: Gastroenterology;  Laterality: N/A;   INGUINAL HERNIA REPAIR Bilateral 01/2021   IR INTRAVASCULAR ULTRASOUND NON CORONARY  03/17/2022   IR PARACENTESIS  10/17/2021   IR PARACENTESIS  01/24/2022   IR PARACENTESIS  02/19/2022   IR PARACENTESIS  03/05/2022   IR PARACENTESIS  03/17/2022   IR PARACENTESIS  05/05/2022   IR RADIOLOGIST EVAL & MGMT  02/24/2022   IR RADIOLOGIST EVAL & MGMT  04/17/2022   IR TIPS  03/17/2022   IR US GUIDE VASC ACCESS RIGHT  03/17/2022   KNEE SURGERY     RADIOLOGY WITH ANESTHESIA N/A 03/17/2022   Procedure: TIPS;  Surgeon: Suzette Battiest, MD;  Location: [facility];  Service: Radiology;  Laterality: N/A;    Allergies: Patient has no known allergies.  Medications: Prior to Admission medications   Medication Sig Start Date End Date Taking? Authorizing Provider  acebutolol (SECTRAL) 200 MG capsule Take 1 capsule (200 mg total) by mouth daily. Patient taking differently: Take  200 mg by mouth every evening. 02/11/22   Richardo Priest, MD  aspirin 81 MG chewable tablet Chew 1 tablet by mouth daily.    [provider]  fluticasone (FLONASE) 50 MCG/ACT nasal spray Place 1 spray into both nostrils as needed for congestion. 02/12/21   [provider]  isosorbide mononitrate (IMDUR) 30 MG 24 hr tablet Take 1 tablet (30 mg total) by mouth daily. Patient taking differently: Take 30 mg by mouth every evening. 07/03/21 07/05/27  Richardo Priest, MD  lactulose (CHRONULAC) 10 GM/15ML solution Take 30 mLs (20 g  total) by mouth 2 (two) times daily. 04/01/22   Doran Stabler, MD  Microlet Lancets MISC  11/22/21   [provider]  neomycin-polymyxin-hydrocortisone (CORTISPORIN) OTIC solution Apply 1 drop to both big toes at painful corners after bath/shower once a day until pain is better 03/11/22   Landis Martins, DPM  nitroGLYCERIN (NITROSTAT) 0.4 MG SL tablet DISSOLVE 1 TABLET UNDER THE TONGUE EVERY 5 MINUTES FOR UP TO 3 DOSES AS NEEDED FOR CHEST PAIN 04/30/21   Richardo Priest, MD  pantoprazole (PROTONIX) 40 MG tablet Take 40 mg by mouth in the morning.    [provider]  sertraline (ZOLOFT) 25 MG tablet Take 25 mg by mouth in the morning. 10/02/21   [provider]     Family History  Problem Relation Age of Onset   Other Mother        died with old age   Stroke Father    Diabetes Father    Heart disease Father    Congenital heart disease Daughter    Colon cancer Neg Hx    Esophageal cancer Neg Hx    Pancreatic cancer Neg Hx    Stomach cancer Neg Hx     Social History   Socioeconomic History   Marital status: Married    Spouse name: Electrical engineer   Number of children: 2   Years of education: Not on file   Highest education level: Not on file  Occupational History   Occupation: retired  Tobacco Use   Smoking status: Never    Passive exposure: Past   Smokeless tobacco: Never  Vaping Use   Vaping Use: Never used  Substance and Sexual Activity   Alcohol use: Never   Drug use: Never   Sexual activity: Not on file  Other Topics Concern   Not on file  Social History Narrative   Not on file   Social Determinants of Health   Financial Resource Strain: Not on file  Food Insecurity: Not on file  Transportation Needs: Not on file  Physical Activity: Not on file  Stress: Not on file  Social Connections: Not on file     Vital Signs: There were no vitals taken for this visit.  No physical examination was performed in lieu of virtual telephone clinic  visit.   Imaging: TIPS creation 03/17/22:  8+2 Viatorr Portosystemic gradient of 15 mm Hg (absolute portal venous pressure 28 mm Hg) before shunt placement and 7 mm Hg (absolute portal venous pressure 19 mm Hg) after shunt placement.   Labs: 6/29-30/23 Na 136, K 4.2, Cl 105, CO2 26, BUN 16, Cr 1.59, Glu 198 Albumin 2.7 AST 24 ALT 11 Tbili 0.9 INR 1.2  MELD = 13  Assessment and Plan: 76 y.o. male with history of cryptogenic cirrhosis (Child Pugh B, MELD 16 pre TIPS --> 13 current) with portal hypertension and recurrent ascites in the setting of decreased  diuresis limited by renal function.  He is recovering well status post TIPS creation on 03/17/22 which was uncomplicated and achieved a portosystemic mean pressure gradient reduction from 15 to 7 mmHg without post-deployment balloon dilation.  Autodiuresis has improved, ascites formation significantly slowed.  -agree with continued paracentesis as needed -continue single dose of lactulose each day -defer diuretic management to Nephrology, would not increase dose at this point given renal function and improved fluid dynamics -follow up in 3 months    Ruthann Cancer, MD Pager: [phone] Clinic: [phone]    I spent a total of 25 Minutes in virtual telephone clinical consultation, greater than 50% of which was counseling/coordinating care for portal hypertension.

## 2022-05-31 IMAGING — US IR PARACENTESIS
1 series · 4 of 4 positions shown · non-contrast
Comparison: none

INDICATION: Patient with a history of cirrhosis and recurrent ascites presents
today for a therapeutic paracentesis with 5 L max.

[Series 1: ir (person_name)/(person_name) · 4 of 4 slices shown]
[im 1/4]
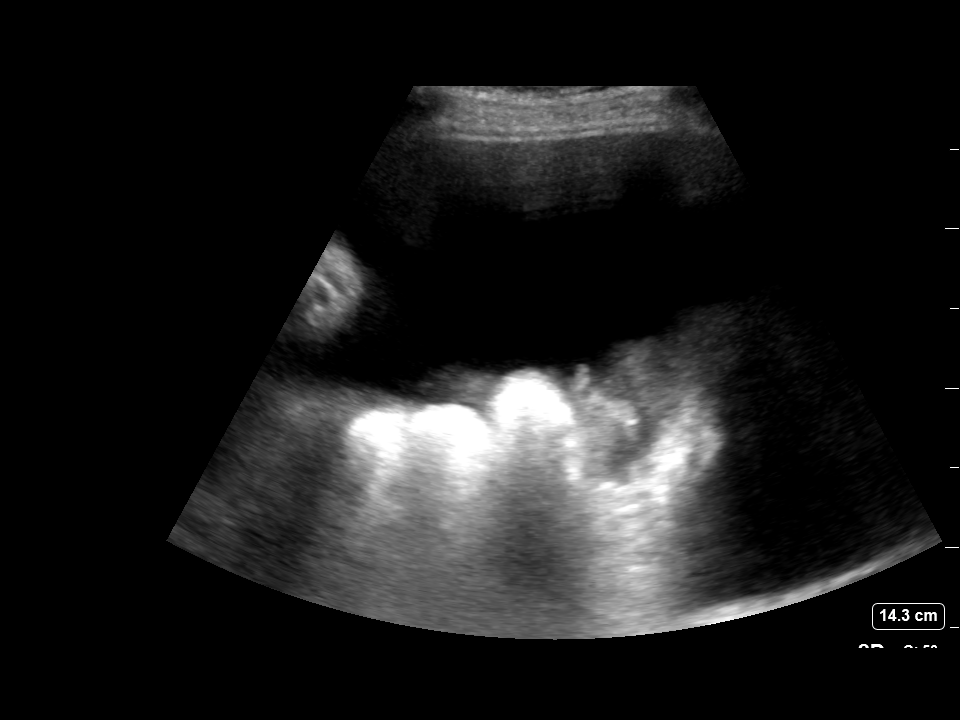
[im 2/4]
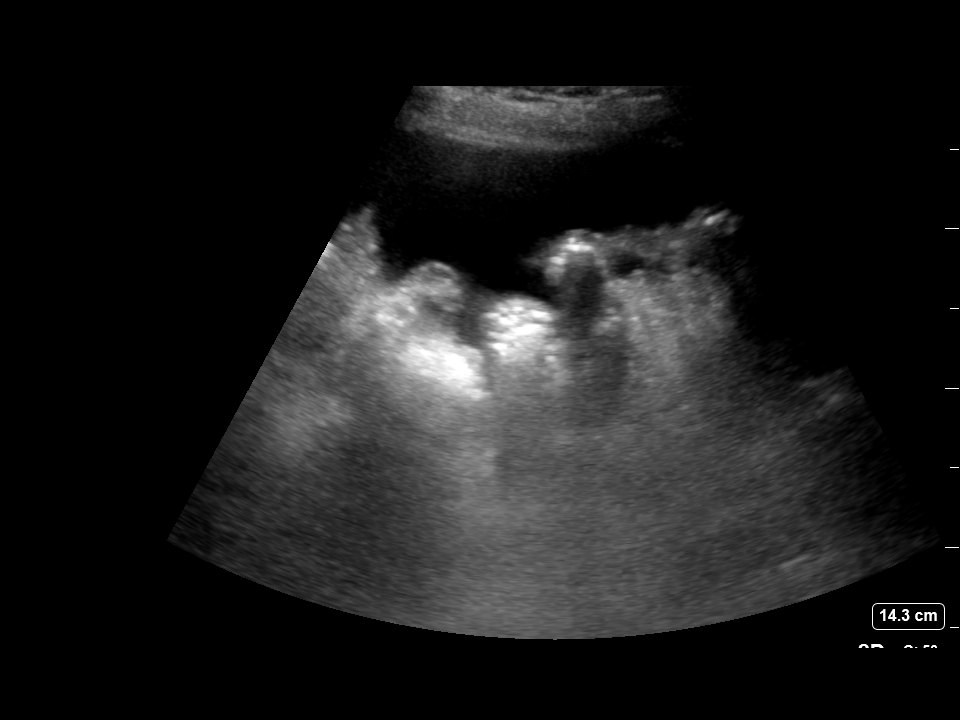
[im 3/4]
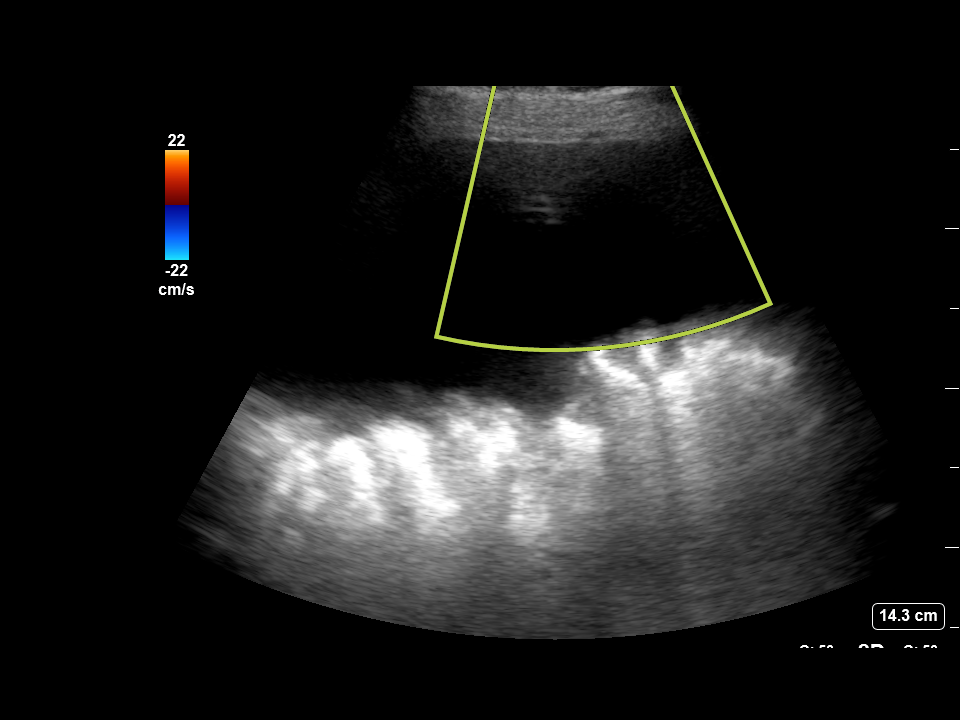
[im 4/4]
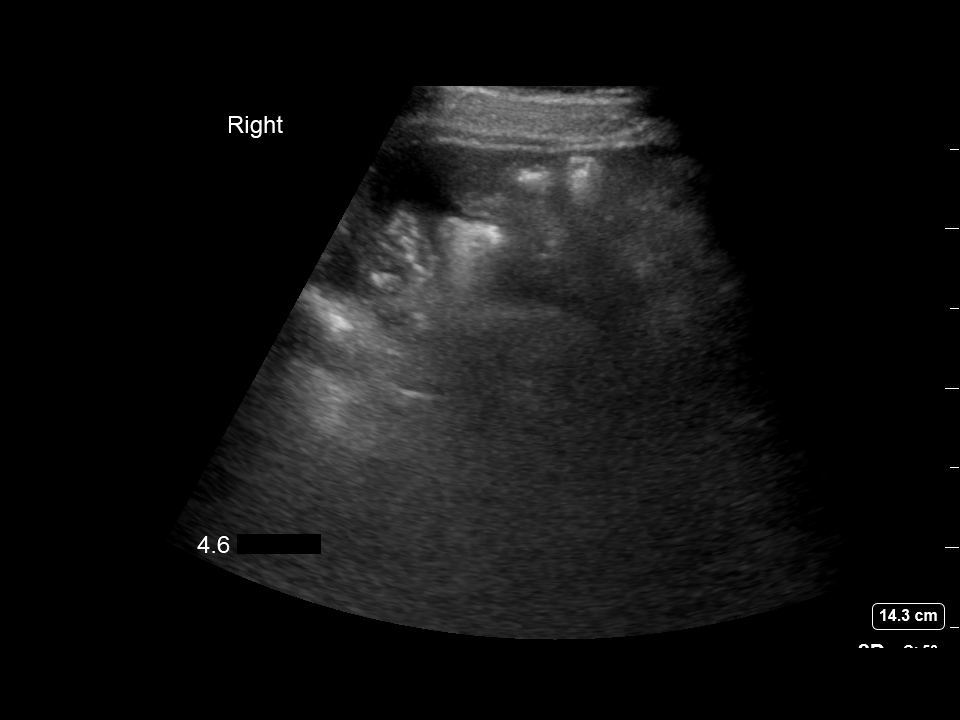

[4 of 4 positions shown; findings below may reference images not displayed]

EXAM:
ULTRASOUND GUIDED PARACENTESIS

MEDICATIONS:
1% lidocaine 10 mL

COMPLICATIONS:
None immediate.

PROCEDURE:
Informed written consent was obtained from the patient after a
discussion of the risks, benefits and alternatives to treatment. A
timeout was performed prior to the initiation of the procedure.

Initial ultrasound scanning demonstrates a large amount of ascites
within the right lower abdominal quadrant. The right lower abdomen
was prepped and draped in the usual sterile fashion. 1% lidocaine
was used for local anesthesia.

Following this, a 19 gauge, 7-cm, Yueh catheter was introduced. An
ultrasound image was saved for documentation purposes. The
paracentesis was performed. The catheter was removed and a dressing
was applied. The patient tolerated the procedure well without
immediate post procedural complication.
Patient received post-procedure intravenous albumin; see nursing
notes for details.
FINDINGS: A total of approximately 4.6 L of clear yellow fluid was removed.
IMPRESSION: Successful ultrasound-guided paracentesis yielding 4.6 liters of
peritoneal fluid. Read by: Klariska Zaroual, NP

PLAN:
The patient has required >/=2 paracenteses in a 30 day period and a
Hypertension Clinic has been arranged.

## 2022-06-05 DIAGNOSIS — E1122 Type 2 diabetes mellitus with diabetic chronic kidney disease: Secondary | ICD-10-CM | POA: Diagnosis not present

## 2022-06-05 DIAGNOSIS — I129 Hypertensive chronic kidney disease with stage 1 through stage 4 chronic kidney disease, or unspecified chronic kidney disease: Secondary | ICD-10-CM | POA: Diagnosis not present

## 2022-06-05 DIAGNOSIS — N2581 Secondary hyperparathyroidism of renal origin: Secondary | ICD-10-CM | POA: Diagnosis not present

## 2022-06-05 DIAGNOSIS — D631 Anemia in chronic kidney disease: Secondary | ICD-10-CM | POA: Diagnosis not present

## 2022-06-05 DIAGNOSIS — E871 Hypo-osmolality and hyponatremia: Secondary | ICD-10-CM | POA: Diagnosis not present

## 2022-06-05 DIAGNOSIS — E8809 Other disorders of plasma-protein metabolism, not elsewhere classified: Secondary | ICD-10-CM | POA: Diagnosis not present

## 2022-06-05 DIAGNOSIS — R609 Edema, unspecified: Secondary | ICD-10-CM | POA: Diagnosis not present

## 2022-06-05 DIAGNOSIS — K7460 Unspecified cirrhosis of liver: Secondary | ICD-10-CM | POA: Diagnosis not present

## 2022-06-05 DIAGNOSIS — N1832 Chronic kidney disease, stage 3b: Secondary | ICD-10-CM | POA: Diagnosis not present

## 2022-06-09 ENCOUNTER — Ambulatory Visit: Payer: Medicare Other | Admitting: Podiatry

## 2022-06-12 ENCOUNTER — Encounter: Payer: Self-pay | Admitting: Podiatry

## 2022-06-12 ENCOUNTER — Ambulatory Visit (INDEPENDENT_AMBULATORY_CARE_PROVIDER_SITE_OTHER): Payer: Medicare Other | Admitting: Podiatry

## 2022-06-12 DIAGNOSIS — E1140 Type 2 diabetes mellitus with diabetic neuropathy, unspecified: Secondary | ICD-10-CM | POA: Diagnosis not present

## 2022-06-12 DIAGNOSIS — M79674 Pain in right toe(s): Secondary | ICD-10-CM

## 2022-06-12 DIAGNOSIS — M79675 Pain in left toe(s): Secondary | ICD-10-CM

## 2022-06-12 DIAGNOSIS — R739 Hyperglycemia, unspecified: Secondary | ICD-10-CM

## 2022-06-12 DIAGNOSIS — B351 Tinea unguium: Secondary | ICD-10-CM

## 2022-06-12 NOTE — Progress Notes (Signed)
  Subjective:  Patient ID: Matthew Ferguson, male    DOB: Mar 08, 1946,  MRN: [id number]  Matthew Ferguson presents to clinic today for at risk foot care with history of diabetic neuropathy and painful elongated mycotic toenails 1-5 bilaterally which are tender when wearing enclosed shoe gear. Pain is relieved with periodic professional debridement.  Patient states blood glucose was 391 mg/dl today. Patient states he ate pound cake on last night. He currently does not take medication for his diabetes. States he cannot tolerate Metformin.  Last A1c was 7.3%. He states he is feeling fine at today's visit. Notes no dizziness, lethargy or shakiness.  New problem(s): None.   PCP is Garwin Brothers, MD , and last visit was 1-2 months ago.  No Known Allergies  Review of Systems: Negative except as noted in the HPI.  Objective: No changes noted in today's physical examination. Vascular Examination: CFT <3 seconds b/l. DP/PT pulses faintly palpable b/l. Skin temperature gradient warm to warm b/l. No ischemia or gangrene. No cyanosis or clubbing noted b/l. Pedal hair absent. No pain with calf compression b/l.   Neurological Examination: Sensation grossly intact b/l with 10 gram monofilament. Vibratory sensation intact b/l. Pt has subjective symptoms of neuropathy.  Dermatological Examination: Pedal skin warm and supple b/l. Toenails 1-5 b/l thick, discolored, elongated with subungual debris and pain on dorsal palpation.    Musculoskeletal Examination: Muscle strength 5/5 to b/l LE. Hammertoe deformity noted 2-5 b/l. Pes planus deformity noted bilateral LE.  Radiographs: None  Last A1c:      Latest Ref Rng & Units 03/12/2022    3:00 PM  Hemoglobin A1C  Hemoglobin-A1c 4.8 - 5.6 % 7.3    Assessment/Plan: 1. Pain due to onychomycosis of toenails of both feet   2. Blood glucose elevated   3. Type 2 diabetes mellitus with diabetic neuropathy, without long-term current use of insulin ([place])      -Patient was  evaluated and treated. All patient's and/or POA's questions/concerns answered on today's visit. -Examined patient. -Will notify PCP of elevated blood glucose reading reported by patient on today. He is asymptomatic at visit. He did eat pound cake on last night and takes no medication for his diabetes. -Mycotic toenails 1-5 bilaterally were debrided in length and girth with sterile nail nippers and dremel without incident. -Patient/POA to call should there be question/concern in the interim.   Return in about 3 months (around 09/12/2022).  Marzetta Board, DPM

## 2022-06-16 DIAGNOSIS — N1832 Chronic kidney disease, stage 3b: Secondary | ICD-10-CM | POA: Diagnosis not present

## 2022-06-16 DIAGNOSIS — E1122 Type 2 diabetes mellitus with diabetic chronic kidney disease: Secondary | ICD-10-CM | POA: Diagnosis not present

## 2022-06-16 IMAGING — CT CT CTA ABD/PEL W/CM AND/OR W/O CM
3 of 10 series · 12 of 46 positions shown, 17 images · IV contrast (OMNIPAQUE)
Comparison: 08/27/2020

CLINICAL DATA: 75-year-old male with cirrhosis and possible tips.

EXAM:
CTA ABDOMEN AND PELVIS WITHOUT AND WITH CONTRAST
TECHNIQUE: Multidetector CT imaging of the abdomen and pelvis was performed
using the standard protocol during bolus administration of
intravenous contrast. Multiplanar reconstructed images and MIPs were
obtained and reviewed to evaluate the vascular anatomy.

[Series 5: brto arterial · axial · arterial · 0.96mm/px · z∈[+974,+1264]mm · 6 of 156 slices shown]
[im 20/156  soft-tissue]
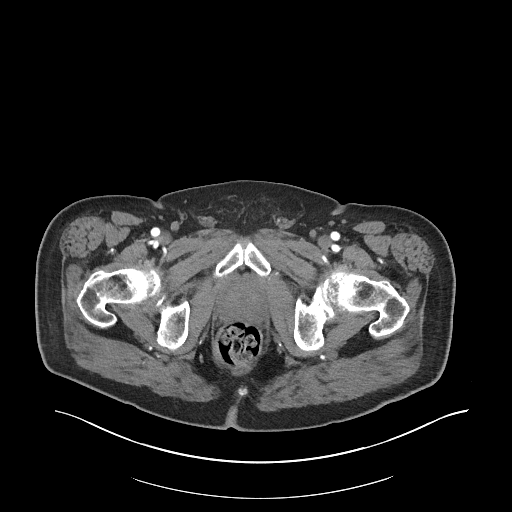
[im 39/156  soft-tissue]
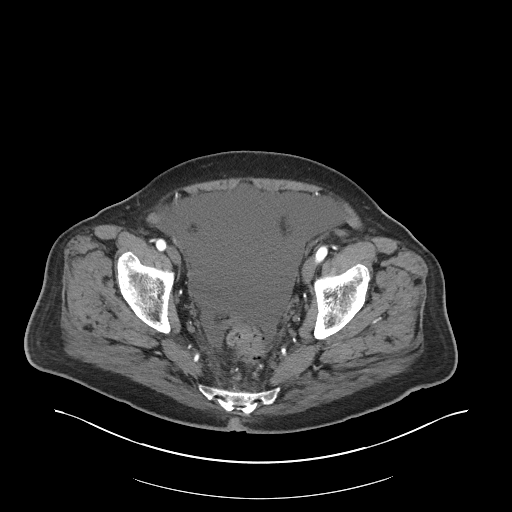
[im 59/156  soft-tissue]
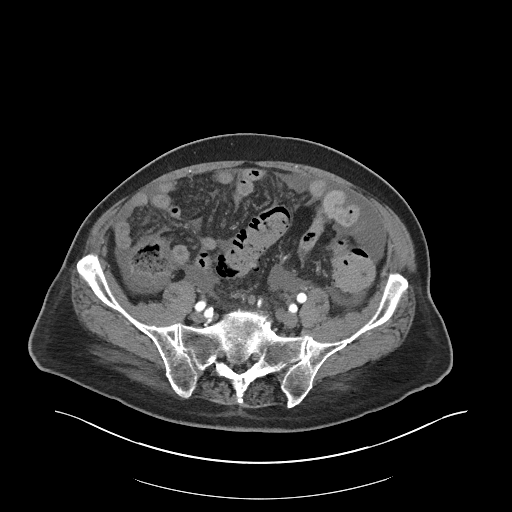
[im 78/156  soft-tissue]
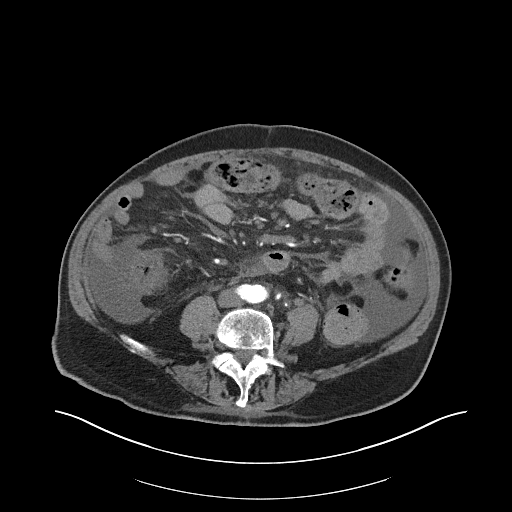
[im 97/156  soft-tissue]
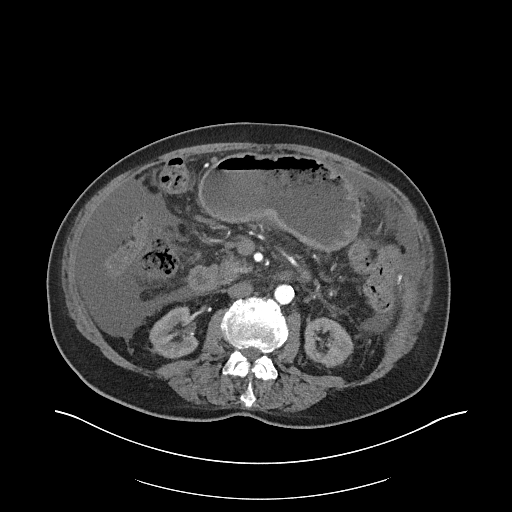
[im 117/156  soft-tissue]
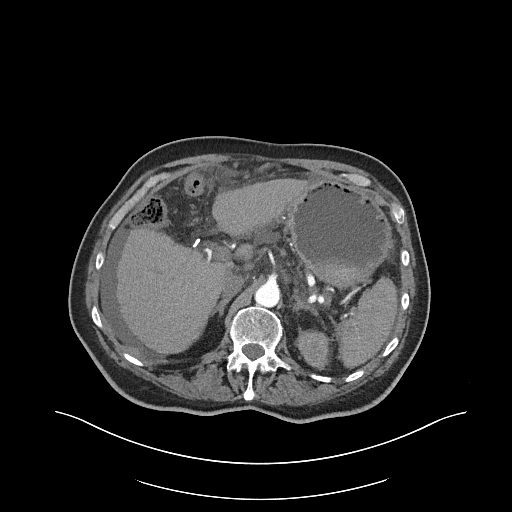

[Series 8: coronal arterial · coronal · arterial · 0.95mm/px · 3 of 92 slices shown, 4 images]
[im 23/92  soft-tissue]
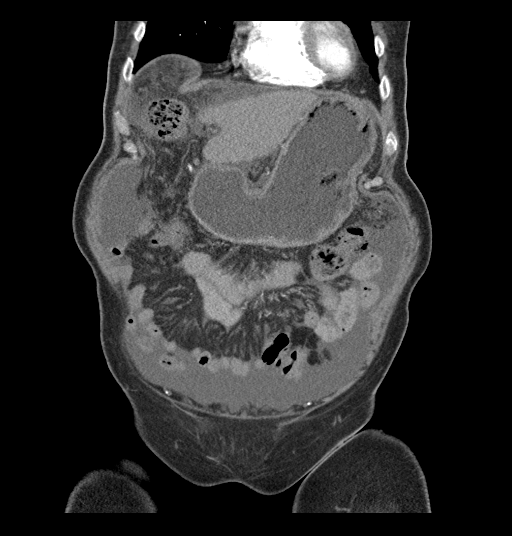
[im 46/92  soft-tissue]
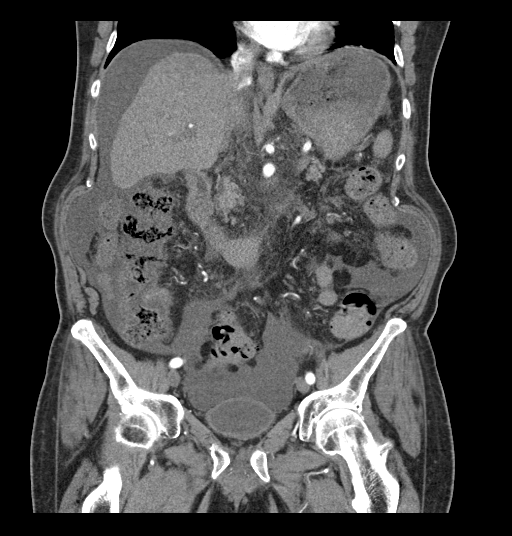
[im 46/92  bone]
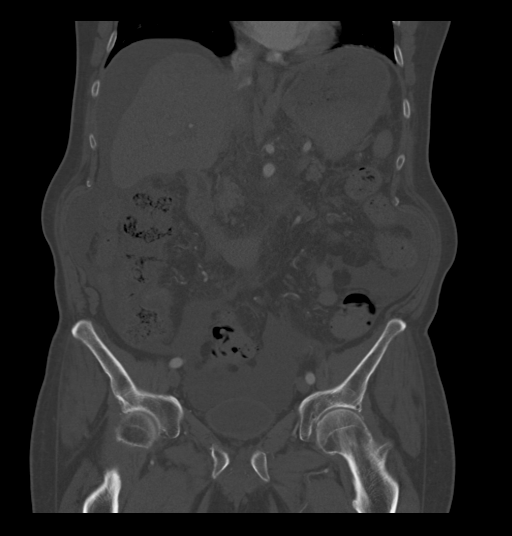
[im 69/92  soft-tissue]
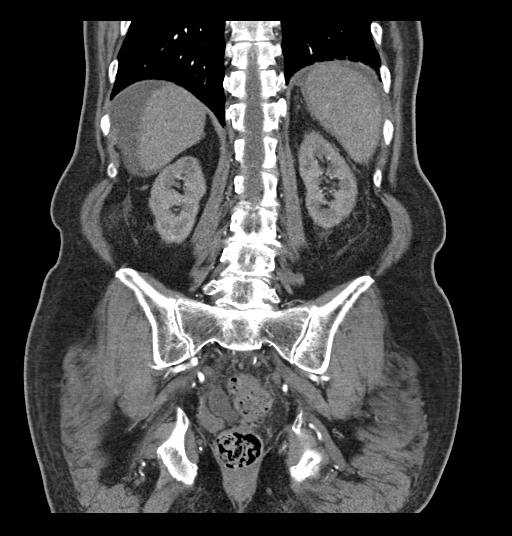

[Series 10: brto portal venous · axial · portal-venous · 0.96mm/px · z∈[+1032,+1262]mm · 3 of 94 slices shown, 7 images]
[im 24/94  soft-tissue]
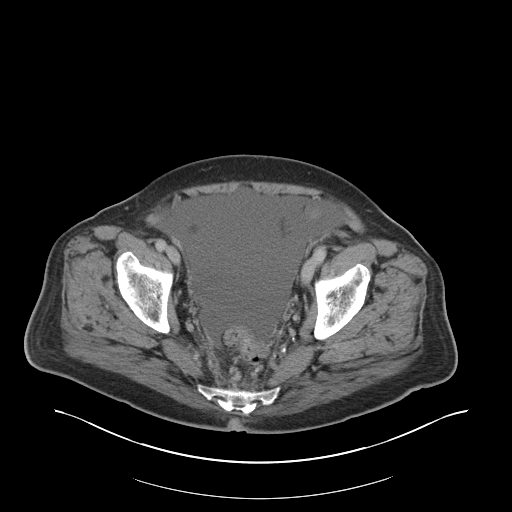
[im 24/94  lung]
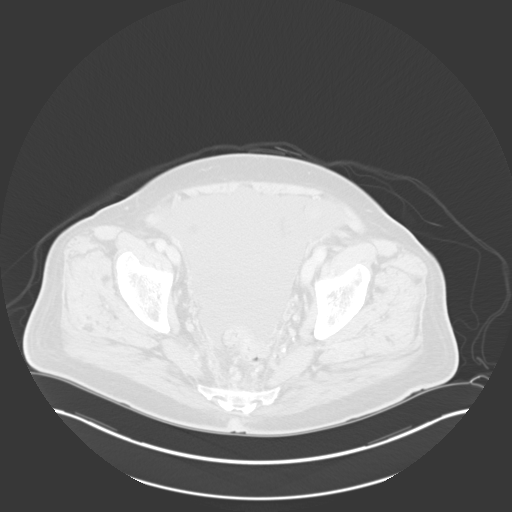
[im 24/94  bone]
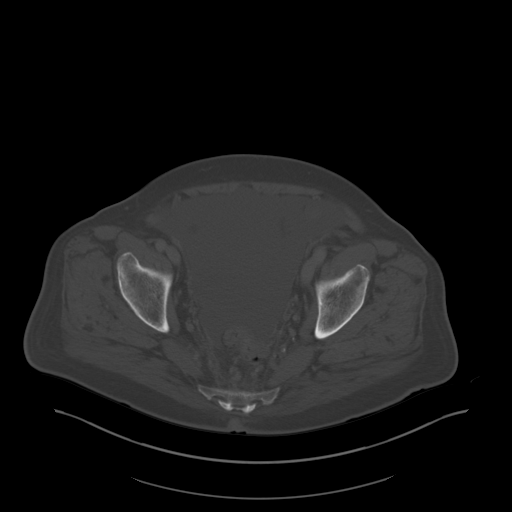
[im 47/94  soft-tissue]
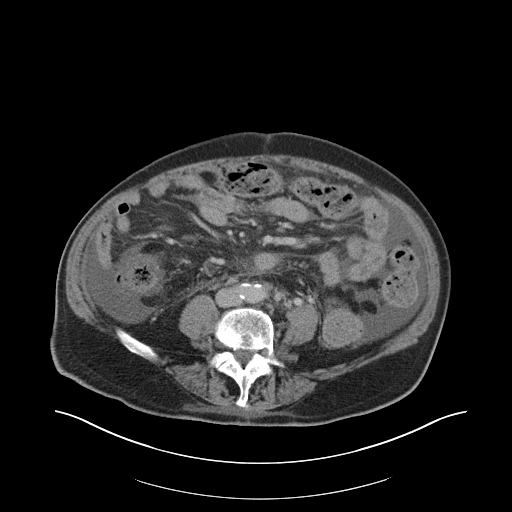
[im 47/94  lung]
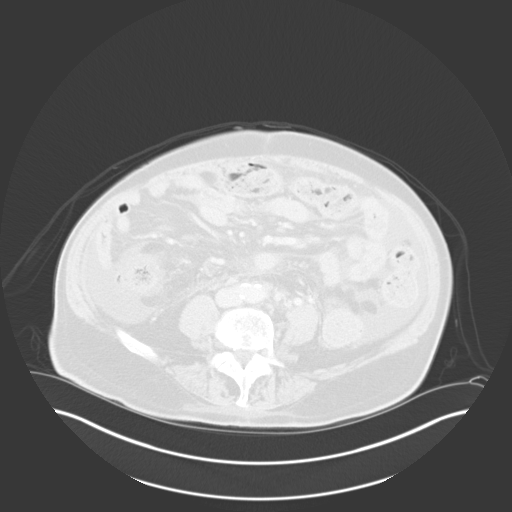
[im 70/94  soft-tissue]
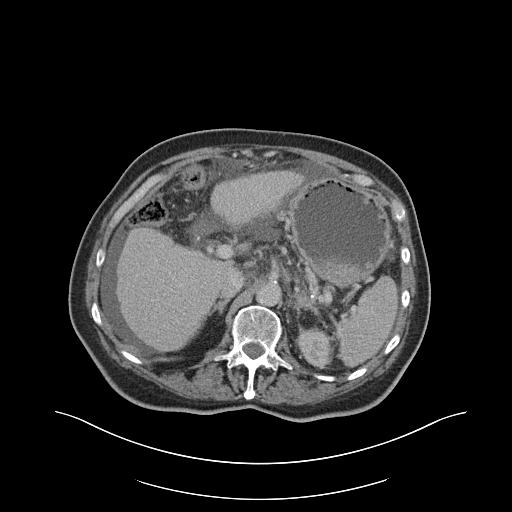
[im 70/94  lung]
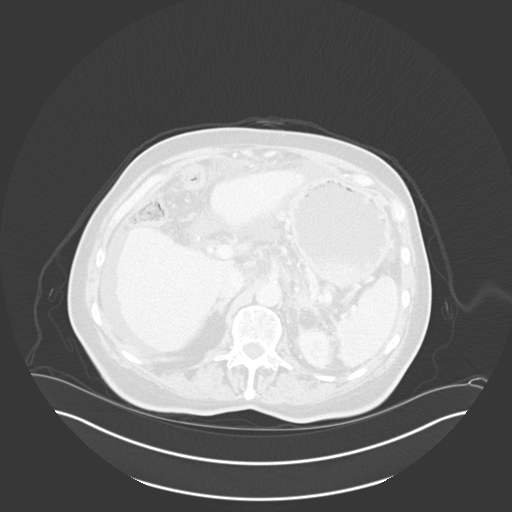

[12 of 46 positions shown; findings below may reference images not displayed]

RADIATION DOSE REDUCTION: This exam was performed according to the
departmental dose-optimization program which includes automated
exposure control, adjustment of the mA and/or kV according to
patient size and/or use of iterative reconstruction technique.

CONTRAST:  80mL OMNIPAQUE IOHEXOL 350 MG/ML SOLN
FINDINGS: VASCULAR

Aorta: Minimal aortic atherosclerosis of the lower thoracic and the
abdominal aorta. No pedunculated plaque, ulcerated plaque,
dissection, aneurysm.

Celiac: Patent, with no significant atherosclerotic changes.

SMA: SMA patent with no significant atherosclerotic changes.
Replaced right hepatic artery.

Renals:

- Right: Right renal artery patent.

- Left: Left renal artery patent.

IMA: Inferior mesenteric artery is patent.

Right lower extremity:

Mild tortuosity of the right iliac system. Mild atherosclerotic
changes of the common iliac artery, hypogastric artery, external
iliac artery without high-grade stenosis or occlusion. No aneurysm,
dissection.

Common femoral artery patent with no significant atherosclerosis.
Proximal profunda femoris and SFA patent.

Left lower extremity:

Tortuosity of the left iliac system. Mild atherosclerosis of the
common iliac artery, hypogastric artery, external iliac artery. No
high-grade stenosis or occlusion. No ulcerated plaque, dissection or
aneurysm.

Common femoral artery patent with minimal atherosclerosis.

Proximal profunda femoris and SFA patent.

Veins:

Portal system:

Hepatic veins patent

Portal vein patent.  Right and left portal veins patent.

Splenic vein patent.

SMV and IMV patent.

Recanalized umbilical vein.

Review of the MIP images confirms the above findings.

NON-VASCULAR

Lower chest: No acute.

Hepatobiliary: Nodular contour of the liver compatible with
cirrhosis. No focal lesion. Cholecystectomy

Pancreas: Unremarkable.

Spleen: Greatest axial dimension of the spleen, 9.1 cm.

Adrenals/Urinary Tract:

- Right adrenal gland: Unremarkable

- Left adrenal gland: Unremarkable.

- Right kidney: No hydronephrosis, nephrolithiasis, inflammation, or
ureteral dilation. No focal lesion.

- Left Kidney: No hydronephrosis, nephrolithiasis, inflammation, or
ureteral dilation. No focal lesion.

- Urinary Bladder: Unremarkable.

Stomach/Bowel:

- Stomach: Unremarkable.

- Small bowel: Unremarkable

- Appendix: Normal.

- Colon: Moderate formed stool burden. No transition point. Colonic
diverticula with no focal inflammatory changes of the left colon.

Lymphatic: No adenopathy.

Mesenteric: Low-density ascites of the abdomen.  Mesenteric edema.

Reproductive: Transverse diameter of the prostate estimated 4.2 cm.

Other: None

Musculoskeletal: Degenerative changes of the spine. No displaced
fracture. Degenerative changes of the hips.
IMPRESSION: Cirrhosis and ascites again demonstrated.

Portal system patent, with recanalization of the umbilical vein.

Aortic Atherosclerosis (H58BC-XQS.S).

Additional ancillary findings as above.

## 2022-06-26 DIAGNOSIS — N1832 Chronic kidney disease, stage 3b: Secondary | ICD-10-CM | POA: Diagnosis not present

## 2022-07-14 ENCOUNTER — Other Ambulatory Visit: Payer: Self-pay | Admitting: Interventional Radiology

## 2022-07-14 DIAGNOSIS — K7469 Other cirrhosis of liver: Secondary | ICD-10-CM

## 2022-07-17 DIAGNOSIS — N1832 Chronic kidney disease, stage 3b: Secondary | ICD-10-CM | POA: Diagnosis not present

## 2022-07-23 ENCOUNTER — Ambulatory Visit
Admission: RE | Admit: 2022-07-23 | Discharge: 2022-07-23 | Disposition: A | Discharge status: Home / Self Care | Payer: Medicare Other | Source: Ambulatory Visit | Attending: Interventional Radiology | Admitting: Interventional Radiology

## 2022-07-23 DIAGNOSIS — Z9689 Presence of other specified functional implants: Secondary | ICD-10-CM | POA: Diagnosis not present

## 2022-07-23 DIAGNOSIS — K7469 Other cirrhosis of liver: Secondary | ICD-10-CM | POA: Diagnosis not present

## 2022-07-23 DIAGNOSIS — K766 Portal hypertension: Secondary | ICD-10-CM | POA: Diagnosis not present

## 2022-07-23 HISTORY — PX: IR RADIOLOGIST EVAL & MGMT: IMG5224

## 2022-07-23 NOTE — Progress Notes (Signed)
Referring Physician(s): Wilfrid Lund, MD   Reason for follow up:  3 month status post TIPS creation   History of present illness: 76 y.o. male with history of cryptogenic cirrhosis (Child Pugh B, MELD 16) with portal hypertension and recurrent ascites in the setting of decreased diuresis limited by renal function.  He is status post TIPS creation on 03/17/22 which was uncomplicated and achieved a portosystemic mean pressure gradient reduction from 15 to 7 mmHg without post-deployment balloon dilation.    He presents today via telephone virtual clinic visit.    Memory problems, needs walker to ambulate, has some blood tinged urine, frequency large volume urine.  No longer driving due to "being in a haze".  States that his nephrologist (Dr. Gean Quint) told him to stop taking lactulose due to labile blood glucose, going to resume next week after labs.     Off diuretics.  No edema.  No recurrence of ascites.      Past Medical History:  Diagnosis Date   Ascites    Chronic kidney disease    Cirrhosis ([place])    Complication of anesthesia    Post operative vomiting for "2 weeks" per pt after cholecystectomy   Coronary artery disease involving native coronary artery 05/29/2015   05/08/12 had PCI and Xience stent to RCA for Troponin normal unstable angina with residual 40-50% proximal and 30-40 % mid LAD stenosis, EF normal 55-60%   Depression    Essential hypertension 05/29/2015   GERD (gastroesophageal reflux disease)    Hepatitis-C 03/31/2021   undetectable virus on 03/31/2021.     Irritable bowel syndrome    Mixed hyperlipidemia 05/29/2015   Palpitations    PONV (postoperative nausea and vomiting)    Tinnitus    Type 2 diabetes mellitus ([place]) 05/29/2015    Past Surgical History:  Procedure Laterality Date   BACK SURGERY     CARDIAC CATHETERIZATION     CHOLECYSTECTOMY     COLONOSCOPY WITH PROPOFOL N/A 04/02/2021   Procedure: COLONOSCOPY WITH PROPOFOL;  Surgeon: Irene Shipper, MD;  Location: [facility] ENDOSCOPY;  Service: Endoscopy;  Laterality: N/A;   CORONARY ANGIOPLASTY WITH STENT PLACEMENT     ESOPHAGOGASTRODUODENOSCOPY (EGD) WITH PROPOFOL N/A 04/01/2021   Procedure: ESOPHAGOGASTRODUODENOSCOPY (EGD) WITH PROPOFOL;  Surgeon: Doran Stabler, MD;  Location: [facility];  Service: Gastroenterology;  Laterality: N/A;   INGUINAL HERNIA REPAIR Bilateral 01/2021   IR INTRAVASCULAR ULTRASOUND NON CORONARY  03/17/2022   IR PARACENTESIS  10/17/2021   IR PARACENTESIS  01/24/2022   IR PARACENTESIS  02/19/2022   IR PARACENTESIS  03/05/2022   IR PARACENTESIS  03/17/2022   IR PARACENTESIS  05/05/2022   IR RADIOLOGIST EVAL & MGMT  02/24/2022   IR RADIOLOGIST EVAL & MGMT  04/17/2022   IR RADIOLOGIST EVAL & MGMT  05/14/2022   IR TIPS  03/17/2022   IR US GUIDE VASC ACCESS RIGHT  03/17/2022   KNEE SURGERY     RADIOLOGY WITH ANESTHESIA N/A 03/17/2022   Procedure: TIPS;  Surgeon: Suzette Battiest, MD;  Location: [facility];  Service: Radiology;  Laterality: N/A;    Allergies: Patient has no known allergies.  Medications: Prior to Admission medications   Medication Sig Start Date End Date Taking? Authorizing Provider  acebutolol (SECTRAL) 200 MG capsule Take 1 capsule (200 mg total) by mouth daily. Patient taking differently: Take 200 mg by mouth every evening. 02/11/22   Richardo Priest, MD  aMILoride (MIDAMOR) 5 MG tablet  Take 1 tablet by mouth daily. 01/24/22   [provider]  aspirin 81 MG chewable tablet Chew 1 tablet by mouth daily.    [provider]  fluticasone (FLONASE) 50 MCG/ACT nasal spray Place into the nose. 02/28/22   [provider]  furosemide (LASIX) 20 MG tablet Take 20 mg by mouth every other day. 04/02/22   [provider]  isosorbide mononitrate (IMDUR) 30 MG 24 hr tablet Take 1 tablet (30 mg total) by mouth daily. Patient taking differently: Take 30 mg by mouth every evening. 07/03/21 07/05/27  Richardo Priest, MD  lactulose (CHRONULAC) 10  GM/15ML solution Take 30 mLs (20 g total) by mouth 2 (two) times daily. 04/01/22   Doran Stabler, MD  Microlet Lancets MISC  11/22/21   [provider]  neomycin-polymyxin-hydrocortisone (CORTISPORIN) OTIC solution Apply 1 drop to both big toes at painful corners after bath/shower once a day until pain is better 03/11/22   Landis Martins, DPM  nitroGLYCERIN (NITROSTAT) 0.4 MG SL tablet DISSOLVE 1 TABLET UNDER THE TONGUE EVERY 5 MINUTES FOR UP TO 3 DOSES AS NEEDED FOR CHEST PAIN 04/30/21   Richardo Priest, MD  pantoprazole (PROTONIX) 40 MG tablet Take 40 mg by mouth in the morning.    [provider]  sertraline (ZOLOFT) 25 MG tablet Take 1 tablet by mouth daily. 02/14/22   [provider]  spironolactone (ALDACTONE) 25 MG tablet Take by mouth. 02/05/22   [provider]     Family History  Problem Relation Age of Onset   Other Mother        died with old age   Stroke Father    Diabetes Father    Heart disease Father    Congenital heart disease Daughter    Colon cancer Neg Hx    Esophageal cancer Neg Hx    Pancreatic cancer Neg Hx    Stomach cancer Neg Hx     Social History   Socioeconomic History   Marital status: Married    Spouse name: Electrical engineer   Number of children: 2   Years of education: Not on file   Highest education level: Not on file  Occupational History   Occupation: retired  Tobacco Use   Smoking status: Never    Passive exposure: Past   Smokeless tobacco: Never  Vaping Use   Vaping Use: Never used  Substance and Sexual Activity   Alcohol use: Never   Drug use: Never   Sexual activity: Not on file  Other Topics Concern   Not on file  Social History Narrative   Not on file   Social Determinants of Health   Financial Resource Strain: Not on file  Food Insecurity: Not on file  Transportation Needs: Not on file  Physical Activity: Not on file  Stress: Not on file  Social Connections: Not on file     Vital Signs: There  were no vitals taken for this visit.  No physical examination was performed in lieu of virtual telephone clinic visit.  Imaging: TIPS creation 03/17/22:  8+2 Viatorr Portosystemic gradient of 15 mm Hg (absolute portal venous pressure 28 mm Hg) before shunt placement and 7 mm Hg (absolute portal venous pressure 19 mm Hg) after shunt placement.    No new pertinent imaging.  Labs:  CBC: Recent Labs    10/15/21 1507 03/12/22 1504 03/18/22 0113 03/19/22 0557  WBC 5.5 6.5 9.6 11.7*  HGB 11.3* 9.3* 10.6* 9.7*  HCT 34.0* 28.3*  31.3* 28.9*  PLT 102.0* 134* 133* 139*    COAGS: Recent Labs    10/15/21 1507 03/17/22 0753 03/18/22 0113 03/19/22 0557  INR 1.4* 1.3* 1.4* 1.5*    BMP: Recent Labs    03/12/22 1504 03/18/22 0113 03/19/22 0557 05/02/22 1422  NA 137 133* 133* 136  K 4.1 4.5 4.0 4.2  CL 110 104 104 105  CO2 22 17* 21* 26  GLUCOSE 212* 177* 172* 198*  BUN 16 15 24* 16  CALCIUM 8.6* 8.6* 8.5* 8.3*  CREATININE 1.79* 1.54* 1.81* 1.59*  GFRNONAA 39* 46* 38*  --     LIVER FUNCTION TESTS: Recent Labs    03/12/22 1504 03/18/22 0113 03/19/22 0557 05/02/22 1422  BILITOT 0.8 2.5* 1.8* 0.9  AST 20 83* 210* 24  ALT 12 55* 153* 11  ALKPHOS 92 107 107 150*  PROT 6.2* 6.1* 5.5* 6.0  ALBUMIN 2.8* 2.8* 2.6* 2.7*    Assessment and Plan: 76 y.o. male with history of cryptogenic cirrhosis (Child Pugh B, MELD 16 pre TIPS --> 13 current) with portal hypertension and recurrent ascites in the setting of decreased diuresis limited by renal function.  He is recovering well status post TIPS creation on 03/17/22 which was uncomplicated and achieved a portosystemic mean pressure gradient reduction from 15 to 7 mmHg without post-deployment balloon dilation.  Autodiuresis has improved, ascites formation has completely stopped.  Unfortunately he is now having some advancing dementia problems per his wife, and new onset weakness.  He is working closely with Dr. Candiss Norse from [facility] to manage diuretics (off entirely now) and diabetes.   -continue at least single dose of lactulose each day -follow up in 6 months   Electronically Signed: Rosanne Ashing Lenora Gomes 07/23/2022, 1:35 PM   I spent a total of 25 Minutes in virtual telephone clinical consultation, greater than 50% of which was counseling/coordinating care for portal hypertension.

## 2022-07-25 DIAGNOSIS — Z23 Encounter for immunization: Secondary | ICD-10-CM | POA: Diagnosis not present

## 2022-07-29 DIAGNOSIS — N1832 Chronic kidney disease, stage 3b: Secondary | ICD-10-CM | POA: Diagnosis not present

## 2022-08-12 ENCOUNTER — Other Ambulatory Visit: Payer: Self-pay | Admitting: Cardiology

## 2022-08-22 ENCOUNTER — Ambulatory Visit: Payer: Medicare Other | Attending: Cardiology | Admitting: Cardiology

## 2022-08-22 ENCOUNTER — Encounter: Payer: Self-pay | Admitting: Cardiology

## 2022-08-22 VITALS — BP 112/62 | HR 62 | Ht 69.0 in | Wt 188.0 lb

## 2022-08-22 DIAGNOSIS — I952 Hypotension due to drugs: Secondary | ICD-10-CM | POA: Insufficient documentation

## 2022-08-22 DIAGNOSIS — Z9889 Other specified postprocedural states: Secondary | ICD-10-CM | POA: Insufficient documentation

## 2022-08-22 DIAGNOSIS — R001 Bradycardia, unspecified: Secondary | ICD-10-CM | POA: Diagnosis not present

## 2022-08-22 DIAGNOSIS — I2510 Atherosclerotic heart disease of native coronary artery without angina pectoris: Secondary | ICD-10-CM | POA: Insufficient documentation

## 2022-08-22 DIAGNOSIS — H9319 Tinnitus, unspecified ear: Secondary | ICD-10-CM | POA: Insufficient documentation

## 2022-08-22 DIAGNOSIS — R188 Other ascites: Secondary | ICD-10-CM | POA: Diagnosis not present

## 2022-08-22 DIAGNOSIS — K7460 Unspecified cirrhosis of liver: Secondary | ICD-10-CM | POA: Insufficient documentation

## 2022-08-22 DIAGNOSIS — R002 Palpitations: Secondary | ICD-10-CM | POA: Insufficient documentation

## 2022-08-22 NOTE — Patient Instructions (Addendum)
Medication Instructions:  Check your medications at home and let us know if you are taking both amlodipine and spironolactone.  Let Dr. Bettina Gavia know if your heart rate is frequently less than 50 beats per minute. *If you need a refill on your cardiac medications before your next appointment, please call your pharmacy*   Lab Work: None ordered If you have labs (blood work) drawn today and your tests are completely normal, you will receive your results only by: [facility] (if you have MyChart) OR A paper copy in the mail If you have any lab test that is abnormal or we need to change your treatment, we will call you to review the results.   Testing/Procedures: None ordered   Follow-Up: At [facility], you and your health needs are our priority.  As part of our continuing mission to provide you with exceptional heart care, we have created designated Provider Care Teams.  These Care Teams include your primary Cardiologist (physician) and Advanced Practice Providers (APPs -  Physician Assistants and Nurse Practitioners) who all work together to provide you with the care you need, when you need it.  We recommend signing up for the patient portal called "MyChart".  Sign up information is provided on this After Visit Summary.  MyChart is used to connect with patients for Virtual Visits (Telemedicine).  Patients are able to view lab/test results, encounter notes, upcoming appointments, etc.  Non-urgent messages can be sent to your provider as well.   To learn more about what you can do with MyChart, go to [email or web address].    Your next appointment:   6 month(s)  The format for your next appointment:   In Person  Provider:   Shirlee More, MD   Other Instructions NA

## 2022-08-22 NOTE — Progress Notes (Signed)
Cardiology Office Note:    Date:  08/22/2022   ID:  Matthew Ferguson, DOB 01/19/1946, MRN [id number]  PCP:  Garwin Brothers, MD  Cardiologist:  Shirlee More, MD    Referring MD: Garwin Brothers, MD    ASSESSMENT:    1. Coronary artery disease involving native coronary artery of native heart, unspecified whether angina present   2. Cirrhosis of liver with ascites, unspecified hepatic cirrhosis type ([place])   3. Hypotension due to drugs   4. Sinus bradycardia    PLAN:    In order of problems listed above:  Since his previous visits he is improved he has had no recurrent hypotension and has had no further angina since starting a minimum dose of beta-blocker with ISA and is not having severe bradycardia.  I think this is a good clinical equipoise.  His coronary regimen consist of low-dose aspirin isosorbide and currently is not on lipid-lowering therapy with his liver disease. I suspect liver disease is his primary illness and his complaints about thought process related to encephalopathy he has taken lactulose.  His ascites is improved he is on a low-dose loop diuretic and takes an MRA and we will reconcile his meds when he gets home. Hypotension has resolved no longer on midodrine I think his bradycardia is mild have asked the family to trend his heart rates and if they are consistently less than 50 to contact me   Next appointment: I will plan to see in 6 months   Medication Adjustments/Labs and Tests Ordered: Current medicines are reviewed at length with the patient today.  Concerns regarding medicines are outlined above.  No orders of the defined types were placed in this encounter.  No orders of the defined types were placed in this encounter.   Chief Complaint  Patient presents with   Follow-up   Coronary Artery Disease    And increasing frequency of angina and was restarted on low-dose selective beta-blocker last visit.    History of Present Illness:    Matthew Ferguson is a 76 y.o.  male with a hx of CAD with PCI of the right coronary artery in 2013 hypertension hyperlipidemia type 2 diabetes hepatitis C with cirrhosis and portal venous hypertension and ascites and previous severe anemia requiring hospitalization and admission to [facility] in May 2022 hemoglobin of 6.9 associated with GI bleed. Endoscopy showed esophageal varices portal gastropathy and colonoscopy is normal except for internal hemorrhoids.  He was seen 01/01/2021 3 with symptomatic hypotension and bradycardia.  I stopped his loop diuretic as he was not edematous and with bradycardia and liver disease reduce the dose of his beta-blocker.  He had a therapeutic paracentesis and when last seen 02/11/2022 had reaccumulated his ascites and was having increasing frequency of angina restarted on low-dose selective beta-blocker.He utilized an event monitor reported 01/25/2022 both ventricular and supraventricular ectopy were not frequent his symptomatic episodes were at times associated with APCs and PVCs.   He was subsequently seen in follow-up by his liver specialist who stopped his beta-blocker and transition him to a different MRA with gynecomastia.  Compliance with diet, lifestyle and medications: Yes  His wife is present participates in the evaluation decision making There unsure about his medications it looks like he is taken 2 distal diuretics and I asked the patient and his wife to go home and reconcile medications I think spironolactone was discontinued and he takes amiloride. He can carry his note from the [facility] saying he has sinus bradycardia.  Previously we had stopped his beta-blocker however he developed recurrent angina we restarted a low-dose of a selective beta-blocker with ISA activity Sectra he has tolerated it well his heart rates have remained greater than 50 and fortunately has had no further angina Is not having edema ascites shortness of breath palpitation or syncope He is a bit  desponded about his life his inability to ambulate and the difficulty he has with his thought process likely a complication of his liver disease Past Medical History:  Diagnosis Date   Ascites    Chronic kidney disease    Cirrhosis ([place])    Complication of anesthesia    Post operative vomiting for "2 weeks" per pt after cholecystectomy   Coronary artery disease involving native coronary artery 05/29/2015   05/08/12 had PCI and Xience stent to RCA for Troponin normal unstable angina with residual 40-50% proximal and 30-40 % mid LAD stenosis, EF normal 55-60%   Depression    Essential hypertension 05/29/2015   GERD (gastroesophageal reflux disease)    Hepatitis-C 03/31/2021   undetectable virus on 03/31/2021.     Irritable bowel syndrome    Mixed hyperlipidemia 05/29/2015   Palpitations    PONV (postoperative nausea and vomiting)    Tinnitus    Type 2 diabetes mellitus ([place]) 05/29/2015    Past Surgical History:  Procedure Laterality Date   BACK SURGERY     CARDIAC CATHETERIZATION     CHOLECYSTECTOMY     COLONOSCOPY WITH PROPOFOL N/A 04/02/2021   Procedure: COLONOSCOPY WITH PROPOFOL;  Surgeon: Irene Shipper, MD;  Location: [facility] ENDOSCOPY;  Service: Endoscopy;  Laterality: N/A;   CORONARY ANGIOPLASTY WITH STENT PLACEMENT     ESOPHAGOGASTRODUODENOSCOPY (EGD) WITH PROPOFOL N/A 04/01/2021   Procedure: ESOPHAGOGASTRODUODENOSCOPY (EGD) WITH PROPOFOL;  Surgeon: Doran Stabler, MD;  Location: [place];  Service: Gastroenterology;  Laterality: N/A;   INGUINAL HERNIA REPAIR Bilateral 01/2021   IR INTRAVASCULAR ULTRASOUND NON CORONARY  03/17/2022   IR PARACENTESIS  10/17/2021   IR PARACENTESIS  01/24/2022   IR PARACENTESIS  02/19/2022   IR PARACENTESIS  03/05/2022   IR PARACENTESIS  03/17/2022   IR PARACENTESIS  05/05/2022   IR RADIOLOGIST EVAL & MGMT  02/24/2022   IR RADIOLOGIST EVAL & MGMT  04/17/2022   IR RADIOLOGIST EVAL & MGMT  05/14/2022   IR RADIOLOGIST EVAL & MGMT  07/23/2022   IR TIPS   03/17/2022   IR US GUIDE VASC ACCESS RIGHT  03/17/2022   KNEE SURGERY     RADIOLOGY WITH ANESTHESIA N/A 03/17/2022   Procedure: TIPS;  Surgeon: Suzette Battiest, MD;  Location: [place];  Service: Radiology;  Laterality: N/A;    Current Medications: Current Meds  Medication Sig   acebutolol (SECTRAL) 200 MG capsule Take 1 capsule (200 mg total) by mouth daily. (Patient taking differently: Take 200 mg by mouth every evening.)   aMILoride (MIDAMOR) 5 MG tablet Take 1 tablet by mouth daily.   aspirin 81 MG chewable tablet Chew 1 tablet by mouth daily.   fluticasone (FLONASE) 50 MCG/ACT nasal spray Place into the nose.   furosemide (LASIX) 20 MG tablet Take 20 mg by mouth every other day.   isosorbide mononitrate (IMDUR) 30 MG 24 hr tablet TAKE 1 TABLET DAILY   lactulose (CHRONULAC) 10 GM/15ML solution Take 30 mLs (20 g total) by mouth 2 (two) times daily.   Microlet Lancets MISC    neomycin-polymyxin-hydrocortisone (CORTISPORIN) OTIC solution Apply 1 drop to both big toes at  painful corners after bath/shower once a day until pain is better   nitroGLYCERIN (NITROSTAT) 0.4 MG SL tablet DISSOLVE 1 TABLET UNDER THE TONGUE EVERY 5 MINUTES FOR UP TO 3 DOSES AS NEEDED FOR CHEST PAIN   pantoprazole (PROTONIX) 40 MG tablet Take 40 mg by mouth in the morning.   sertraline (ZOLOFT) 25 MG tablet Take 1 tablet by mouth daily.   spironolactone (ALDACTONE) 25 MG tablet Take by mouth.     Allergies:   Patient has no known allergies.   Social History   Socioeconomic History   Marital status: Married    Spouse name: Electrical engineer   Number of children: 2   Years of education: Not on file   Highest education level: Not on file  Occupational History   Occupation: retired  Tobacco Use   Smoking status: Never    Passive exposure: Past   Smokeless tobacco: Never  Vaping Use   Vaping Use: Never used  Substance and Sexual Activity   Alcohol use: Never   Drug use: Never   Sexual activity: Not on file  Other Topics  Concern   Not on file  Social History Narrative   Not on file   Social Determinants of Health   Financial Resource Strain: Not on file  Food Insecurity: Not on file  Transportation Needs: Not on file  Physical Activity: Not on file  Stress: Not on file  Social Connections: Not on file     Family History: The patient's family history includes Congenital heart disease in his daughter; Diabetes in his father; Heart disease in his father; Other in his mother; Stroke in his father. There is no history of Colon cancer, Esophageal cancer, Pancreatic cancer, or Stomach cancer. ROS:   Please see the history of present illness.    All other systems reviewed and are negative.  EKGs/Labs/Other Studies Reviewed:    The following studies were reviewed today:    Recent Labs: 03/19/2022: Hemoglobin 9.7; Platelets 139 05/02/2022: ALT 11; BUN 16; Creatinine, Ser 1.59; Potassium 4.2; Sodium 136  Recent Lipid Panel 06/16/2022 LDL 69 cholesterol 148 Creatinine at that time was 2.09 potassium 4.2  Physical Exam:    VS:  BP 112/62 (BP Location: Left Arm, Patient Position: Sitting)   Pulse 62   Ht '5\' 9"'$  (1.753 m)   Wt 188 lb (85.3 kg)   SpO2 98%   BMI 27.76 kg/m     Wt Readings from Last 3 Encounters:  08/22/22 188 lb (85.3 kg)  05/13/22 178 lb (80.7 kg)  04/01/22 198 lb (89.8 kg)     GEN: Surprisingly does not look chronically ill or debilitated he is alert and oriented his thought process appears clear when I am in the room with him well nourished, well developed in no acute distress HEENT: Normal NECK: No JVD; No carotid bruits LYMPHATICS: No lymphadenopathy CARDIAC: RRR, no murmurs, rubs, gallops RESPIRATORY:  Clear to auscultation without rales, wheezing or rhonchi  ABDOMEN: Soft, non-tender, non-distended MUSCULOSKELETAL:  No edema; No deformity  SKIN: Warm and dry NEUROLOGIC:  Alert and oriented x 3 PSYCHIATRIC:  Normal affect    Signed, Shirlee More, MD  08/22/2022 3:51  PM    [facility]

## 2022-08-26 ENCOUNTER — Other Ambulatory Visit: Payer: Self-pay | Admitting: Cardiology

## 2022-08-29 DIAGNOSIS — Z23 Encounter for immunization: Secondary | ICD-10-CM | POA: Diagnosis not present

## 2022-09-04 ENCOUNTER — Encounter: Payer: Self-pay | Admitting: Podiatry

## 2022-09-04 ENCOUNTER — Ambulatory Visit (INDEPENDENT_AMBULATORY_CARE_PROVIDER_SITE_OTHER): Payer: Medicare Other | Admitting: Podiatry

## 2022-09-04 DIAGNOSIS — B351 Tinea unguium: Secondary | ICD-10-CM

## 2022-09-04 DIAGNOSIS — M2142 Flat foot [pes planus] (acquired), left foot: Secondary | ICD-10-CM

## 2022-09-04 DIAGNOSIS — E1140 Type 2 diabetes mellitus with diabetic neuropathy, unspecified: Secondary | ICD-10-CM | POA: Diagnosis not present

## 2022-09-04 DIAGNOSIS — E119 Type 2 diabetes mellitus without complications: Secondary | ICD-10-CM | POA: Diagnosis not present

## 2022-09-04 DIAGNOSIS — M2040 Other hammer toe(s) (acquired), unspecified foot: Secondary | ICD-10-CM | POA: Diagnosis not present

## 2022-09-04 DIAGNOSIS — M79674 Pain in right toe(s): Secondary | ICD-10-CM

## 2022-09-04 DIAGNOSIS — M79675 Pain in left toe(s): Secondary | ICD-10-CM | POA: Diagnosis not present

## 2022-09-04 DIAGNOSIS — M2141 Flat foot [pes planus] (acquired), right foot: Secondary | ICD-10-CM | POA: Diagnosis not present

## 2022-09-04 NOTE — Progress Notes (Signed)
ANNUAL DIABETIC FOOT EXAM  Subjective: Matthew Ferguson presents today for annual diabetic foot examination.  Chief Complaint  Patient presents with   Nail Problem    Diabetic foot care BS-135 A1C-8.1 PCP-Amin PCP VST- 1 month ago   Patient confirms h/o diabetes.  Patient relates 20 year h/o diabetes.  Patient denies any h/o foot wounds.  Patient has been diagnosed with neuropathy.  Risk factors: diabetes, diabetic neuropathy, HTN, CAD, CKD, hyperlipidemia, passive exposure to smoking .  Garwin Brothers, MD is patient's PCP.  Past Medical History:  Diagnosis Date   Ascites    Chronic kidney disease    Cirrhosis ([place])    Complication of anesthesia    Post operative vomiting for "2 weeks" per pt after cholecystectomy   Coronary artery disease involving native coronary artery 05/29/2015   05/08/12 had PCI and Xience stent to RCA for Troponin normal unstable angina with residual 40-50% proximal and 30-40 % mid LAD stenosis, EF normal 55-60%   Depression    Essential hypertension 05/29/2015   GERD (gastroesophageal reflux disease)    Hepatitis-C 03/31/2021   undetectable virus on 03/31/2021.     Irritable bowel syndrome    Mixed hyperlipidemia 05/29/2015   Palpitations    PONV (postoperative nausea and vomiting)    Tinnitus    Type 2 diabetes mellitus ([name]) 05/29/2015   Patient Active Problem List   Diagnosis Date Noted   Tinnitus 08/22/2022   PONV (postoperative nausea and vomiting) 08/22/2022   Palpitations 08/22/2022   S/P TIPS (transjugular intrahepatic portosystemic shunt) 03/17/2022   Chest pain at rest 03/13/2022   Irritable bowel syndrome 12/30/2021   GERD (gastroesophageal reflux disease) 03/47/4259   Complication of anesthesia 12/30/2021   Cirrhosis ([place]) 12/30/2021   Chronic kidney disease 12/30/2021   Ascites 12/30/2021   Chronic back pain 12/11/2021   Depression 12/11/2021   Hearing loss 12/11/2021   Sensorineural hearing loss, bilateral 12/11/2021   Type  2 diabetes mellitus with diabetic neuropathy, unspecified (Matthew Ferguson) 12/11/2021   Rectal bleeding    Diverticulosis of colon without hemorrhage    Acute blood loss anemia 04/01/2021   Symptomatic anemia 03/31/2021   Hepatitis-C 03/31/2021   Pre-op evaluation 09/09/2019   S/P left knee arthroscopy 08/10/2018   Coronary artery disease involving native coronary artery 05/29/2015   Essential hypertension 05/29/2015   Mixed hyperlipidemia 05/29/2015   Type 2 diabetes mellitus (Matthew Ferguson) 05/29/2015   Past Surgical History:  Procedure Laterality Date   BACK SURGERY     CARDIAC CATHETERIZATION     CHOLECYSTECTOMY     COLONOSCOPY WITH PROPOFOL N/A 04/02/2021   Procedure: COLONOSCOPY WITH PROPOFOL;  Surgeon: Irene Shipper, MD;  Location[facility] ENDOSCOPY;  Service: Endoscopy;  Laterality: N/A;   CORONARY ANGIOPLASTY WITH STENT PLACEMENT     ESOPHAGOGASTRODUODENOSCOPY (EGD) WITH PROPOFOL N/A 04/01/2021   Procedure: ESOPHAGOGASTRODUODENOSCOPY (EGD) WITH PROPOFOL;  Surgeon: Doran Stabler, MD;  Location: [place];  Service: Gastroenterology;  Laterality: N/A;   INGUINAL HERNIA REPAIR Bilateral 01/2021   IR INTRAVASCULAR ULTRASOUND NON CORONARY  03/17/2022   IR PARACENTESIS  10/17/2021   IR PARACENTESIS  01/24/2022   IR PARACENTESIS  02/19/2022   IR PARACENTESIS  03/05/2022   IR PARACENTESIS  03/17/2022   IR PARACENTESIS  05/05/2022   IR RADIOLOGIST EVAL & MGMT  02/24/2022   IR RADIOLOGIST EVAL & MGMT  04/17/2022   IR RADIOLOGIST EVAL & MGMT  05/14/2022   IR RADIOLOGIST EVAL & MGMT  07/23/2022   IR TIPS  03/17/2022   IR US GUIDE VASC ACCESS RIGHT  03/17/2022   KNEE SURGERY     RADIOLOGY WITH ANESTHESIA N/A 03/17/2022   Procedure: TIPS;  Surgeon: Suzette Battiest, MD;  Location: [place];  Service: Radiology;  Laterality: N/A;   Current Outpatient Medications on File Prior to Visit  Medication Sig Dispense Refill   acebutolol (SECTRAL) 200 MG capsule Take 1 capsule (200 mg total) by mouth daily. (Patient taking  differently: Take 200 mg by mouth every evening.) 90 capsule 3   aspirin 81 MG chewable tablet Chew 1 tablet by mouth daily.     fluticasone (FLONASE) 50 MCG/ACT nasal spray Place into the nose.     furosemide (LASIX) 20 MG tablet Take 20 mg by mouth every other day.     isosorbide mononitrate (IMDUR) 30 MG 24 hr tablet TAKE 1 TABLET DAILY 90 tablet 3   Microlet Lancets MISC      neomycin-polymyxin-hydrocortisone (CORTISPORIN) OTIC solution Apply 1 drop to both big toes at painful corners after bath/shower once a day until pain is better 10 mL 0   nitroGLYCERIN (NITROSTAT) 0.4 MG SL tablet Place 1 tablet (0.4 mg total) under the tongue every 5 (five) minutes as needed for chest pain. 25 tablet 11   pantoprazole (PROTONIX) 40 MG tablet Take 40 mg by mouth in the morning.     sertraline (ZOLOFT) 25 MG tablet Take 1 tablet by mouth daily.     No current facility-administered medications on file prior to visit.    No Known Allergies Social History   Occupational History   Occupation: retired  Tobacco Use   Smoking status: Never    Passive exposure: Past   Smokeless tobacco: Never  Vaping Use   Vaping Use: Never used  Substance and Sexual Activity   Alcohol use: Never   Drug use: Never   Sexual activity: Not on file   Family History  Problem Relation Age of Onset   Other Mother        died with old age   Stroke Father    Diabetes Father    Heart disease Father    Congenital heart disease Daughter    Colon cancer Neg Hx    Esophageal cancer Neg Hx    Pancreatic cancer Neg Hx    Stomach cancer Neg Hx    Immunization History  Administered Date(s) Administered   Influenza,inj,Quad PF,6+ Mos 07/19/2018   Influenza-Unspecified 08/04/2015, 08/12/2017, 08/15/2018, 08/20/2019, 07/16/2020, 07/24/2021   PFIZER(Purple Top)SARS-COV-2 Vaccination 11/16/2019, 12/07/2019   Pneumococcal Conjugate-13 08/04/2015   Pneumococcal Polysaccharide-23 06/13/2015     Review of Systems: Negative  except as noted in the HPI.   Objective: There were no vitals filed for this visit.  [name] is a pleasant 76 y.o. male in NAD. AAO X 3.  Vascular Examination: CFT <3 seconds b/l. DP/PT pulses faintly palpable b/l. Skin temperature gradient warm to warm b/l. No pain with calf compression. No ischemia or gangrene. No cyanosis or clubbing noted b/l. Pedal hair absent. No edema noted b/l LE. No ischemia or gangrene noted b/l LE. No cyanosis or clubbing noted b/l LE.   Neurological Examination: Sensation grossly intact b/l with 10 gram monofilament. Vibratory sensation intact b/l. Pt has subjective symptoms of neuropathy.  Dermatological Examination: Pedal skin warm and supple b/l. Toenails 1-5 b/l thick, discolored, elongated with subungual debris and pain on dorsal palpation.  No open wounds b/l LE. No interdigital macerations noted b/l LE. No hyperkeratotic nor porokeratotic lesions  present on today's visit.  Musculoskeletal Examination: Muscle strength 5/5 to b/l LE. Hammertoe deformity noted 2-5 b/l. Pes planus deformity noted bilateral LE.  Radiographs: None  Last A1c:      Latest Ref Rng & Units 03/12/2022    3:00 PM  Hemoglobin A1C  Hemoglobin-A1c 4.8 - 5.6 % 7.3    Footwear Assessment: Does the patient wear appropriate shoes? Yes. Does the patient need inserts/orthotics? No.  ADA Risk Categorization: Low Risk :  Patient has all of the following: Intact protective sensation No prior foot ulcer  No severe deformity Pedal pulses present  Assessment: 1. Pain due to onychomycosis of toenails of both feet   2. Type 2 diabetes mellitus with diabetic neuropathy, without long-term current use of insulin ([place])   3. Pes planus of both feet   4. Hammer toe, unspecified laterality   5. Encounter for diabetic foot exam [facility])     Plan: -Patient was evaluated and treated. All patient's and/or POA's questions/concerns answered on today's visit. -Patient's family member  present. All questions/concerns addressed on today's visit. -Diabetic foot examination performed today. -Patient to continue soft, supportive shoe gear daily. -Toenails 1-5 b/l were debrided in length and girth with sterile nail nippers and dremel without iatrogenic bleeding.  -Patient/POA to call should there be question/concern in the interim. Return in about 3 months (around 12/05/2022).  Marzetta Board, DPM

## 2022-09-08 ENCOUNTER — Encounter: Payer: Self-pay | Admitting: Gastroenterology

## 2022-09-08 ENCOUNTER — Ambulatory Visit (INDEPENDENT_AMBULATORY_CARE_PROVIDER_SITE_OTHER): Payer: Medicare Other | Admitting: Gastroenterology

## 2022-09-08 VITALS — BP 120/68 | HR 55 | Ht 69.0 in | Wt 190.0 lb

## 2022-09-08 DIAGNOSIS — N189 Chronic kidney disease, unspecified: Secondary | ICD-10-CM

## 2022-09-08 DIAGNOSIS — K7460 Unspecified cirrhosis of liver: Secondary | ICD-10-CM | POA: Diagnosis not present

## 2022-09-08 DIAGNOSIS — R188 Other ascites: Secondary | ICD-10-CM | POA: Diagnosis not present

## 2022-09-08 DIAGNOSIS — D631 Anemia in chronic kidney disease: Secondary | ICD-10-CM | POA: Diagnosis not present

## 2022-09-08 DIAGNOSIS — I25118 Atherosclerotic heart disease of native coronary artery with other forms of angina pectoris: Secondary | ICD-10-CM | POA: Diagnosis not present

## 2022-09-08 MED ORDER — LACTULOSE 10 GM/15ML PO SOLN: ORAL | 1 refills | Status: DC

## 2022-09-08 NOTE — Patient Instructions (Addendum)
_______________________________________________________  If you are age 78 or older, your body mass index should be between 23-30. Your Body mass index is 28.06 kg/m. If this is out of the aforementioned range listed, please consider follow up with your Primary Care Provider.  If you are age 34 or younger, your body mass index should be between 19-25. Your Body mass index is 28.06 kg/m. If this is out of the aformentioned range listed, please consider follow up with your Primary Care Provider.   ________________________________________________________  The [place] [facility] providers would like to encourage you to use [facility] to communicate with providers for non-urgent requests or questions.  Due to long hold times on the telephone, sending your provider a message by [facility] may be a faster and more efficient way to get a response.  Please allow 48 business hours for a response.  Please remember that this is for non-urgent requests.  _______________________________________________________  Please follow up on 11-20-2021 @ 120 pm   It was a pleasure to see you today!  Thank you for trusting me with your gastrointestinal care!

## 2022-09-08 NOTE — Progress Notes (Addendum)
[facility] Progress Note  Chief Complaint: Cirrhosis with ascites  Subjective  History: Matthew Ferguson was seen in follow-up for his cirrhosis and ascites.  TIPS performed May 2023, and it took about 2 months for him to have significant improvement in the ascites.  Underlying CKD and had recently establish care with Dr. Candiss Ferguson before the last visit with me on 05/13/2022.  Dr. Candiss Ferguson was cautiously trying to increase furosemide with the challenge of Matthew Ferguson's relative hypotension.  Matthew Ferguson was here with his wife today.  He is on Lasix 1 tablet every other day and no amiloride or spironolactone.  They report that he is having to urinate 15-20 times per day, and sometimes incontinent at nighttime.  He will only have small passages of urine and has difficulty starting and stopping.  Sometimes he does not have the sensation that he has passed urine, and is taken to wearing disposable undergarments. His mobility has also worsened since I last saw him, which they attribute to peripheral neuropathy worse in the left leg.  He is using a walker.  His wife also says he seems to be having more difficulty with his memory.  He was taking lactulose for some time after the TIPS placement but stopped it because it gave him diarrhea.  ROS: Cardiovascular:  no chest pain Respiratory: no dyspnea Generalized fatigue No difficulty sleeping Remainder systems negative except as above The patient's Past Medical, Family and Social History were reviewed and are on file in the EMR. Past Medical History:  Diagnosis Date   Ascites    Chronic kidney disease    Cirrhosis ([place])    Complication of anesthesia    Post operative vomiting for "2 weeks" per pt after cholecystectomy   Coronary artery disease involving native coronary artery 05/29/2015   05/08/12 had PCI and Xience stent to RCA for Troponin normal unstable angina with residual 40-50% proximal and 30-40 % mid LAD stenosis, EF normal 55-60%   Depression    Essential  hypertension 05/29/2015   GERD (gastroesophageal reflux disease)    Hepatitis-C 03/31/2021   undetectable virus on 03/31/2021.     Irritable bowel syndrome    Mixed hyperlipidemia 05/29/2015   Palpitations    PONV (postoperative nausea and vomiting)    Tinnitus    Type 2 diabetes mellitus ([place]) 05/29/2015    Objective:  Med list reviewed  Current Outpatient Medications:    acebutolol (SECTRAL) 200 MG capsule, Take 1 capsule (200 mg total) by mouth daily. (Patient taking differently: Take 200 mg by mouth every evening.), Disp: 90 capsule, Rfl: 3   aspirin 81 MG chewable tablet, Chew 1 tablet by mouth daily., Disp: , Rfl:    empagliflozin (JARDIANCE) 25 MG TABS tablet, Take 0.5 tablets by mouth daily., Disp: , Rfl:    fluticasone (FLONASE) 50 MCG/ACT nasal spray, Place into the nose., Disp: , Rfl:    furosemide (LASIX) 20 MG tablet, Take 20 mg by mouth every other day., Disp: , Rfl:    glimepiride (AMARYL) 2 MG tablet, Take 2 mg by mouth daily., Disp: , Rfl:    isosorbide mononitrate (IMDUR) 30 MG 24 hr tablet, TAKE 1 TABLET DAILY, Disp: 90 tablet, Rfl: 3   lactulose (CHRONULAC) 10 GM/15ML solution, Take 19m once a day, Disp: 1800 mL, Rfl: 1   Microlet Lancets MISC, , Disp: , Rfl:    neomycin-polymyxin-hydrocortisone (CORTISPORIN) OTIC solution, Apply 1 drop to both big toes at painful corners after bath/shower once a day until pain is  better, Disp: 10 mL, Rfl: 0   nitroGLYCERIN (NITROSTAT) 0.4 MG SL tablet, Place 1 tablet (0.4 mg total) under the tongue every 5 (five) minutes as needed for chest pain., Disp: 25 tablet, Rfl: 11   pantoprazole (PROTONIX) 40 MG tablet, Take 40 mg by mouth in the morning., Disp: , Rfl:    sertraline (ZOLOFT) 25 MG tablet, Take 1 tablet by mouth daily., Disp: , Rfl:    Vital signs in last 24 hrs: Vitals:   09/08/22 1316  BP: 120/68  Pulse: (!) 55  SpO2: 97%   Wt Readings from Last 3 Encounters:  09/08/22 190 lb (86.2 kg)  08/22/22 188 lb (85.3 kg)   05/13/22 178 lb (80.7 kg)  (Note weight up 12 pounds since last visit here in July) Physical Exam  Pleasant and conversational as before, slow antalgic gait.  Noticeably more difficulty getting on exam table than last I saw him.  Affect somewhat more flattened than before HEENT: sclera anicteric, oral mucosa moist without lesions Neck: supple, no thyromegaly, JVD or lymphadenopathy Cardiac: Regular without murmur, mild bilateral pretibial edema Pulm: clear to auscultation bilaterally, normal RR and effort noted Abdomen: soft, no tenderness, with active bowel sounds. No guarding or palpable hepatosplenomegaly.  No distention or discernible ascites Skin; warm and dry, no jaundice or rash  Labs:  06/05/22 nephrology office note Labs: Albumin 2.5 BUN 14, creatinine 1.38 Sodium 136, potassium 4.6 Hemoglobin 9.2  Last AFP 1.5 in Dec 2022  ___________________________________________ Radiologic studies:  CLINICAL DATA:  Cirrhosis, status post TIPS creation 03/17/2022   EXAM: DUPLEX ULTRASOUND OF LIVER AND TIPS SHUNT   TECHNIQUE: Color and duplex Doppler ultrasound was performed to evaluate the hepatic in-flow and out-flow vessels.   COMPARISON:  03/17/2022   FINDINGS: Portal Vein Velocities   Main:  26 cm/sec   Right:  41 cm/sec   Left:  31 cm/sec   TIPS Stent Velocities   Proximal:  46 cm/sec   Mid:  113   Distal:  97 cm/sec   IVC: Present and patent with normal respiratory phasicity.   Hepatic Vein Velocities   Right:  16 cm/sec   Mid:  16 cm/sec   Left:  15 cm/sec   Splenic Vein: 25 centimeters/seconds   Superior Mesenteric Vein: 34 centimeters/seconds   Hepatic Artery: 78 centimeters/second   Ascites: Present   Varices: Not visualized   Other findings: Heterogeneous nodular liver compatible with cirrhosis. Small to moderate volume of perihepatic ascites. Portal vein, newly created TIPS, and hepatic veins are all patent with normal directional  flow. No significant waveform or velocity abnormality.   IMPRESSION: Newly created TIPS appears patent with normal directional flow.   Hepatic cirrhosis   Moderate volume of ascites     Electronically Signed   By: Matthew Mages.  Shick Ferguson.D.   On: 04/15/2022 10:58 ____________________________________________ Other:   _____________________________________________ Assessment & Plan  Assessment: Encounter Diagnoses  Name Primary?   Cirrhosis of liver with ascites, unspecified hepatic cirrhosis type ([place]) Yes   Other ascites    Anemia in chronic kidney disease, unspecified CKD stage    Ascites appears under generally good control, though his weight is up 12 pounds since last visit with me.  Despite that, he does not have discernible ascites on physical exam and is not in need of paracentesis.  Diuretic treatment remains a struggle with his CKD and relative hypotension.  I am not sure why he has developed the urinary frequency and incontinence, especially since his diuretic dosing  has not changed substantially since I last saw him.  I wonder if he may have some obstructive symptoms from enlarged prostate.  His wife says they will be seeing his PCP next week, and I recommended they bring this up and ask for a urology evaluation.  It would be more convenient for him to see a urologist in [place] and then in [place].  He has had some memory difficulty, which may be age-related, but I wonder if he could have subtle signs of encephalopathy since his TIPS.  While he did not like the effect of lactulose, I would like him to resume at least low-dose and see if that helps.  If it is not helpful or tolerated, we may be able to get approval for rifaximin.  Plan: Lactulose 10 g per 15 mL, take 15 mL by mouth every morning  No other medicine changes, diuretics per nephrology consultant recommendations  Follow-up with me in 2 to 3 months, at which time we will recheck at ultrasound with Doppler flow/TIPS  interpretation and updated lab work including AFP, INR, bilirubin  He or his wife will contact us if J[place] is developing large volume ascites that requires paracentesis.  I am encouraged that he has not needed that done for several months.  33 minutes were spent on this encounter (including chart review, history/exam, counseling/coordination of care, and documentation) > 50% of that time was spent on counseling and coordination of care.   [name]   Record review addendum:  Nephrology visit 09/29/2022 No change to diuretics BUN 21, creatinine 2.25, electrolytes normal, albumin 3.3  Patient was describing hematuria, so had ANCA and ANA serologies, renal ultrasound and referral to urology.  - H. Danis

## 2022-09-15 ENCOUNTER — Encounter: Payer: Self-pay | Admitting: Internal Medicine

## 2022-09-15 ENCOUNTER — Ambulatory Visit (INDEPENDENT_AMBULATORY_CARE_PROVIDER_SITE_OTHER): Payer: Medicare Other | Admitting: Internal Medicine

## 2022-09-15 VITALS — BP 124/76 | HR 61 | Temp 97.9°F | Resp 18 | Ht 69.0 in | Wt 196.5 lb

## 2022-09-15 DIAGNOSIS — E1140 Type 2 diabetes mellitus with diabetic neuropathy, unspecified: Secondary | ICD-10-CM

## 2022-09-15 DIAGNOSIS — R188 Other ascites: Secondary | ICD-10-CM

## 2022-09-15 DIAGNOSIS — I25118 Atherosclerotic heart disease of native coronary artery with other forms of angina pectoris: Secondary | ICD-10-CM

## 2022-09-15 DIAGNOSIS — K7460 Unspecified cirrhosis of liver: Secondary | ICD-10-CM

## 2022-09-15 DIAGNOSIS — E782 Mixed hyperlipidemia: Secondary | ICD-10-CM | POA: Diagnosis not present

## 2022-09-15 MED ORDER — FUROSEMIDE 20 MG PO TABS: 20.0000 mg | ORAL_TABLET | ORAL | 4 refills | Status: DC

## 2022-09-15 NOTE — Assessment & Plan Note (Signed)
Last hba1c was 8.1 but no hypoglycemia.

## 2022-09-15 NOTE — Assessment & Plan Note (Signed)
Stable and follows with cardiologist

## 2022-09-15 NOTE — Progress Notes (Signed)
   Established Patient Office Visit  Subjective   Patient ID: Matthew Ferguson, male    DOB: May 30, 1946  Age: 76 y.o. MRN: [id number]  Chief Complaint  Patient presents with   Hypertension    Hypertension This is a chronic problem. The current episode started more than 1 month ago. The problem is controlled. Pertinent negatives include no anxiety, chest pain or shortness of breath.    The patient is a 76 year old Caucasian/White male who presents with follow up. his sugar been 100 one day and 200 undered other day.   He denies any further significant symptoms. He denies foot ulcers and recurrent urinary tract infections.  He states he exercises infrequently.  The patient's past medical history is: Angina, Stable, Diabetes Mellitus, Type II, and Hepatitis. There is no history of foot ulcers and pancreatitis.  His last eye exam was last year and is schedule first week of December.   He does not do regular foot exam.    Review of Systems  Constitutional: Negative.   HENT: Negative.    Respiratory: Negative.  Negative for shortness of breath.   Cardiovascular: Negative.  Negative for chest pain.  Gastrointestinal: Negative.       Objective:     BP 124/76 (BP Location: Left Arm, Patient Position: Sitting, Cuff Size: Normal)   Pulse 61   Temp 97.9 F (36.6 C)   Resp 18   Ht '5\' 9"'$  (1.753 m)   Wt 196 lb 8 oz (89.1 kg)   SpO2 97%   BMI 29.02 kg/m    Physical Exam Constitutional:      Appearance: Normal appearance.  HENT:     Head: Normocephalic and atraumatic.  Eyes:     Extraocular Movements: Extraocular movements intact.     Pupils: Pupils are equal, round, and reactive to light.  Cardiovascular:     Rate and Rhythm: Normal rate and regular rhythm.  Pulmonary:     Effort: Pulmonary effort is normal.     Breath sounds: Normal breath sounds.  Abdominal:     General: Bowel sounds are normal.     Palpations: Abdomen is soft.  Musculoskeletal:     Cervical back: Neck  supple.  Neurological:     General: No focal deficit present.     Mental Status: He is alert and oriented to person, place, and time.      No results found for any visits on 09/15/22.    The ASCVD Risk score (Arnett DK, et al., 2019) failed to calculate for the following reasons:   Cannot find a previous HDL lab   Cannot find a previous total cholesterol lab    Assessment & Plan:     Return in about 3 months (around 12/16/2022).    Garwin Brothers, MD

## 2022-09-15 NOTE — Assessment & Plan Note (Signed)
His ascites is slightly worse, he is on spironolactone and I will add lasix 40 mg daily.

## 2022-09-15 NOTE — Assessment & Plan Note (Signed)
His LDL is target control.  

## 2022-09-29 DIAGNOSIS — D631 Anemia in chronic kidney disease: Secondary | ICD-10-CM | POA: Diagnosis not present

## 2022-09-29 DIAGNOSIS — N2581 Secondary hyperparathyroidism of renal origin: Secondary | ICD-10-CM | POA: Diagnosis not present

## 2022-09-29 DIAGNOSIS — E1122 Type 2 diabetes mellitus with diabetic chronic kidney disease: Secondary | ICD-10-CM | POA: Diagnosis not present

## 2022-09-29 DIAGNOSIS — I129 Hypertensive chronic kidney disease with stage 1 through stage 4 chronic kidney disease, or unspecified chronic kidney disease: Secondary | ICD-10-CM | POA: Diagnosis not present

## 2022-09-29 DIAGNOSIS — N1832 Chronic kidney disease, stage 3b: Secondary | ICD-10-CM | POA: Diagnosis not present

## 2022-09-29 DIAGNOSIS — K7460 Unspecified cirrhosis of liver: Secondary | ICD-10-CM | POA: Diagnosis not present

## 2022-09-29 DIAGNOSIS — R319 Hematuria, unspecified: Secondary | ICD-10-CM | POA: Diagnosis not present

## 2022-10-06 ENCOUNTER — Ambulatory Visit (INDEPENDENT_AMBULATORY_CARE_PROVIDER_SITE_OTHER): Payer: Medicare Other | Admitting: Internal Medicine

## 2022-10-06 DIAGNOSIS — N1832 Chronic kidney disease, stage 3b: Secondary | ICD-10-CM | POA: Diagnosis not present

## 2022-10-07 ENCOUNTER — Ambulatory Visit: Payer: Medicare Other

## 2022-10-07 LAB — POCT URINALYSIS DIPSTICK
Bilirubin, UA: NEGATIVE
Blood, UA: NEGATIVE
Glucose, UA: POSITIVE — AB
Ketones, UA: NEGATIVE
Leukocytes, UA: NEGATIVE
Nitrite, UA: NEGATIVE
Protein, UA: NEGATIVE
Spec Grav, UA: 1.015 (ref 1.010–1.025)
Urobilinogen, UA: 0.2 E.U./dL
pH, UA: 5.5 (ref 5.0–8.0)

## 2022-10-07 NOTE — Progress Notes (Signed)
Patient said [facility] needed [place] to collect a sample. We reached out to them but never received a call back so we ran a UA, to have on file.

## 2022-10-14 DIAGNOSIS — N1832 Chronic kidney disease, stage 3b: Secondary | ICD-10-CM | POA: Diagnosis not present

## 2022-10-20 ENCOUNTER — Other Ambulatory Visit (INDEPENDENT_AMBULATORY_CARE_PROVIDER_SITE_OTHER): Payer: Medicare Other

## 2022-10-20 ENCOUNTER — Other Ambulatory Visit: Payer: Self-pay

## 2022-10-20 ENCOUNTER — Ambulatory Visit (INDEPENDENT_AMBULATORY_CARE_PROVIDER_SITE_OTHER): Payer: Medicare Other | Admitting: Gastroenterology

## 2022-10-20 ENCOUNTER — Encounter: Payer: Self-pay | Admitting: Gastroenterology

## 2022-10-20 VITALS — BP 132/66 | HR 57 | Ht 69.0 in

## 2022-10-20 DIAGNOSIS — I25118 Atherosclerotic heart disease of native coronary artery with other forms of angina pectoris: Secondary | ICD-10-CM

## 2022-10-20 DIAGNOSIS — K7460 Unspecified cirrhosis of liver: Secondary | ICD-10-CM | POA: Diagnosis not present

## 2022-10-20 DIAGNOSIS — D631 Anemia in chronic kidney disease: Secondary | ICD-10-CM

## 2022-10-20 DIAGNOSIS — R188 Other ascites: Secondary | ICD-10-CM | POA: Diagnosis not present

## 2022-10-20 DIAGNOSIS — N189 Chronic kidney disease, unspecified: Secondary | ICD-10-CM

## 2022-10-20 LAB — BILIRUBIN, TOTAL: Total Bilirubin: 0.9 mg/dL (ref 0.2–1.2)

## 2022-10-20 LAB — PROTIME-INR
INR: 1.1 ratio — ABNORMAL HIGH (ref 0.8–1.0)
Prothrombin Time: 12.5 s (ref 9.6–13.1)

## 2022-10-20 LAB — CREATININE, SERUM: Creatinine, Ser: 2.36 mg/dL — ABNORMAL HIGH (ref 0.40–1.50)

## 2022-10-20 NOTE — Progress Notes (Signed)
[facility] Progress Note  Chief Complaint: Cirrhosis with ascites  Subjective  History: Matthew Ferguson was last seen 09/07/2022 for his cirrhosis, status post TIPS for refractory ascites and in the setting of underlying CKD. At the last visit, he reported urinary frequency, and I recommended they speak with PCP at his upcoming visit to request urology evaluation.  (?  Prostatic enlargement) He was also having increasing difficulty with his balance and ambulation. He did not appear to have recurrence of large volume ascites, and my recommendations included the following: "Lactulose 10 g per 15 mL, take 15 mL by mouth every morning   No other medicine changes, diuretics per nephrology consultant recommendations   Follow-up with me in 2 to 3 months, at which time we will recheck at ultrasound with Doppler flow/TIPS interpretation and updated lab work including AFP, INR, bilirubin"  He subsequently saw Dr. Candiss Norse of nephrology and follow-up, and I received a note, afterward including summary of that in my last progress note: Nephrology visit 09/29/2022 No change to diuretics BUN 21, creatinine 2.25, electrolytes normal, albumin 3.3   Patient was describing hematuria, so had ANCA and ANA serologies, renal ultrasound and referral to urology. _________________________________  Matthew Ferguson was here with his wife today.  He was scheduled to see me in late January, but they call for sooner appointment because for the last couple of weeks he has had some intermittent sharp or crampy acute onset abdominal pain.  He feels it is like a spasm, and sometimes it hurts like when he had a previous rib fracture.  He has not had a recent fall, though his mobility is definitely decreasing even since I last saw him.  He has to travel in a wheelchair and, where he is at the last visit he was able to use a walker. He still has urinary frequency as previously described, and they tell me he had an ultrasound last week at  an outside health system and I do not have the report.  This was ordered by Dr. Candiss Norse to evaluate the bladder and kidneys. He has been taking the lactulose daily and not noticed any change in bowel habits, which are still regular, though he is gassier. He also has an appointment with the urologist in about 2 weeks.  ROS: Cardiovascular:  no chest pain Respiratory: no dyspnea Mobility is decreasing as noted above His wife says his mental status has been normal lately.  He is mainly frustrated by his multiple chronic medical issues and declining mobility.  The patient's Past Medical, Family and Social History were reviewed and are on file in the EMR.  Objective:  Med list reviewed  Current Outpatient Medications:    acebutolol (SECTRAL) 200 MG capsule, Take 1 capsule (200 mg total) by mouth daily. (Patient taking differently: Take 200 mg by mouth every evening.), Disp: 90 capsule, Rfl: 3   aspirin 81 MG chewable tablet, Chew 1 tablet by mouth daily., Disp: , Rfl:    empagliflozin (JARDIANCE) 25 MG TABS tablet, Take 0.5 tablets by mouth daily., Disp: , Rfl:    fluticasone (FLONASE) 50 MCG/ACT nasal spray, Place into the nose., Disp: , Rfl:    furosemide (LASIX) 20 MG tablet, Take 1 tablet (20 mg total) by mouth every other day., Disp: 90 tablet, Rfl: 4   glimepiride (AMARYL) 2 MG tablet, Take 2 mg by mouth daily., Disp: , Rfl:    isosorbide mononitrate (IMDUR) 30 MG 24 hr tablet, TAKE 1 TABLET DAILY, Disp: 90 tablet,  Rfl: 3   lactulose (CHRONULAC) 10 GM/15ML solution, Take 62m once a day, Disp: 1800 mL, Rfl: 1   Microlet Lancets MISC, , Disp: , Rfl:    neomycin-polymyxin-hydrocortisone (CORTISPORIN) OTIC solution, Apply 1 drop to both big toes at painful corners after bath/shower once a day until pain is better, Disp: 10 mL, Rfl: 0   nitroGLYCERIN (NITROSTAT) 0.4 MG SL tablet, Place 1 tablet (0.4 mg total) under the tongue every 5 (five) minutes as needed for chest pain., Disp: 25 tablet,  Rfl: 11   pantoprazole (PROTONIX) 40 MG tablet, Take 40 mg by mouth in the morning., Disp: , Rfl:    sertraline (ZOLOFT) 25 MG tablet, Take 1 tablet by mouth daily., Disp: , Rfl:    Vital signs in last 24 hrs: Vitals:   10/20/22 0920  BP: 132/66  Pulse: (!) 57   Wt Readings from Last 3 Encounters:  09/15/22 196 lb 8 oz (89.1 kg)  09/08/22 190 lb (86.2 kg)  08/22/22 188 lb (85.3 kg)    Note  increasing weight  Physical Exam  He needed significant assistance to get out of the wheelchair onto the exam table because he is unsteady and weak in the legs. HEENT: sclera anicteric, oral mucosa moist without lesions Neck: supple, no thyromegaly, JVD or lymphadenopathy Cardiac: Regular without murmur,  mild peripheral edema Pulm: clear to auscultation bilaterally, normal RR and effort noted Abdomen: soft, mild LUQ and RUQ tenderness at costal margins, with active bowel sounds.  No bulging flanks, abdomen not distended.  He is tender over the bladder area as well. Skin; warm and dry, no jaundice or rash  Labs:   ___________________________________________ Radiologic studies:   ____________________________________________ Other:   _____________________________________________ Assessment & Plan  Assessment: Encounter Diagnoses  Name Primary?   Cirrhosis of liver with ascites, unspecified hepatic cirrhosis type ([place] Yes   Other ascites    Anemia in chronic kidney disease, unspecified CKD stage    TIamneeds updated labs and imaging to assess his cirrhosis status, [facility]. However, his overall functional status is significantly declining.  He reportedly has a neuropathy, and if that is the explanation then it seems to have rapidly progressed.  He does have a primary care provider, though they are outside this health system.  I recommended they be evaluated for home health and perhaps even palliative/chronic care management for assistance in the  home.  His wife also has limited mobility due to left foot/ankle problem, she finds it increasingly difficult to push him in the wheelchair.  He also requested a prescription for electric wheelchair, and I also directed him to primary care for that.  His abdominal pain is of unclear cause, it has had bilateral costal margins.  It is episodic and crampy, possibly from increased intestinal gas due to lactulose.  I recommend he stop the lactulose for a week see if there is any change and let uKoreaknow.  More than that, he needs evaluation by urology due to his months of urinary frequency.  Bladder and renal ultrasounds were done, results unknown to me.  Presumably that was done to evaluate for obstruction, and Dr. SCandiss Norsewill truly convey results to them.   Plan: Right upper quadrant ultrasound with Doppler flow, HCC screening and TIPS surveillance AFP, INR, bilirubin  30 minutes were spent on this encounter (including chart review, history/exam, counseling/coordination of care, and documentation) > 50% of that time was spent on counseling and coordination of care.  [name]

## 2022-10-20 NOTE — Patient Instructions (Signed)
Your provider has requested that you go to the basement level for lab work before leaving today. Press "B" on the elevator. The lab is located at the first door on the left as you exit the elevator.  You have been scheduled for an abdominal ultrasound/liver doppler at [facility] ([address]) on Friday, 11/07/22 at 8:45 am. Please arrive 15 minutes prior to your appointment for registration. Make certain not to have anything to eat or drink after midnight on the night prior to your appointment. Should you need to reschedule your appointment, please contact radiology at least 24 hours in advance at [phone] to avoid a 75 dollar no show fee. This test typically takes about 30 minutes to perform.   _______________________________________________________  If you are age 78 or older, your body mass index should be between 23-30. Your Body mass index is 29.02 kg/m. If this is out of the aforementioned range listed, please consider follow up with your Primary Care Provider.  If you are age 71 or younger, your body mass index should be between 19-25. Your Body mass index is 29.02 kg/m. If this is out of the aformentioned range listed, please consider follow up with your Primary Care Provider.   ________________________________________________________  The [facility] providers would like to encourage you to use [facility] to communicate with providers for non-urgent requests or questions.  Due to long hold times on the telephone, sending your provider a message by [facility] may be a faster and more efficient way to get a response.  Please allow 48 business hours for a response.  Please remember that this is for non-urgent requests.  _______________________________________________________  Due to recent changes in healthcare laws, you may see the results of your imaging and laboratory studies on MyChart before your provider has had a chance to review them.  We understand that in some cases there  may be results that are confusing or concerning to you. Not all laboratory results come back in the same time frame and the provider may be waiting for multiple results in order to interpret others.  Please give [place] 48 hours in order for your provider to thoroughly review all the results before contacting the office for clarification of your results.

## 2022-10-22 ENCOUNTER — Ambulatory Visit: Payer: Medicare Other | Admitting: Internal Medicine

## 2022-10-22 ENCOUNTER — Other Ambulatory Visit: Payer: Self-pay

## 2022-10-22 LAB — AFP TUMOR MARKER: AFP-Tumor Marker: 2.8 ng/mL (ref <6.1)

## 2022-10-22 MED ORDER — EMPAGLIFLOZIN 25 MG PO TABS: 25.0000 mg | ORAL_TABLET | Freq: Every day | ORAL | 3 refills | Status: DC

## 2022-10-24 ENCOUNTER — Other Ambulatory Visit: Payer: Self-pay

## 2022-10-24 MED ORDER — NEOMYCIN-POLYMYXIN-HC 3.5-10000-1 OT SOLN: OTIC | 0 refills | Status: AC

## 2022-11-06 DIAGNOSIS — R351 Nocturia: Secondary | ICD-10-CM | POA: Diagnosis not present

## 2022-11-06 DIAGNOSIS — R3915 Urgency of urination: Secondary | ICD-10-CM | POA: Diagnosis not present

## 2022-11-06 DIAGNOSIS — R310 Gross hematuria: Secondary | ICD-10-CM | POA: Diagnosis not present

## 2022-11-06 DIAGNOSIS — R3912 Poor urinary stream: Secondary | ICD-10-CM | POA: Diagnosis not present

## 2022-11-06 DIAGNOSIS — R350 Frequency of micturition: Secondary | ICD-10-CM | POA: Diagnosis not present

## 2022-11-07 ENCOUNTER — Ambulatory Visit
Admission: RE | Admit: 2022-11-07 | Discharge: 2022-11-07 | Disposition: A | Discharge status: Home / Self Care | Payer: Medicare Other | Source: Ambulatory Visit | Attending: Gastroenterology | Admitting: Gastroenterology

## 2022-11-07 DIAGNOSIS — Z9049 Acquired absence of other specified parts of digestive tract: Secondary | ICD-10-CM | POA: Diagnosis not present

## 2022-11-07 DIAGNOSIS — R188 Other ascites: Secondary | ICD-10-CM

## 2022-11-07 DIAGNOSIS — D631 Anemia in chronic kidney disease: Secondary | ICD-10-CM

## 2022-11-07 DIAGNOSIS — K7460 Unspecified cirrhosis of liver: Secondary | ICD-10-CM

## 2022-11-14 ENCOUNTER — Other Ambulatory Visit: Payer: Self-pay

## 2022-11-14 MED ORDER — PANTOPRAZOLE SODIUM 40 MG PO TBEC: 40.0000 mg | DELAYED_RELEASE_TABLET | Freq: Every morning | ORAL | 3 refills | Status: DC

## 2022-11-14 MED ORDER — GLIMEPIRIDE 2 MG PO TABS: 2.0000 mg | ORAL_TABLET | Freq: Every day | ORAL | 3 refills | Status: DC

## 2022-11-20 ENCOUNTER — Ambulatory Visit: Payer: Medicare Other | Admitting: Gastroenterology

## 2022-12-02 DIAGNOSIS — M5136 Other intervertebral disc degeneration, lumbar region: Secondary | ICD-10-CM | POA: Diagnosis not present

## 2022-12-02 DIAGNOSIS — K5730 Diverticulosis of large intestine without perforation or abscess without bleeding: Secondary | ICD-10-CM | POA: Diagnosis not present

## 2022-12-02 DIAGNOSIS — N400 Enlarged prostate without lower urinary tract symptoms: Secondary | ICD-10-CM | POA: Diagnosis not present

## 2022-12-02 DIAGNOSIS — R310 Gross hematuria: Secondary | ICD-10-CM | POA: Diagnosis not present

## 2022-12-09 DIAGNOSIS — Z043 Encounter for examination and observation following other accident: Secondary | ICD-10-CM | POA: Diagnosis not present

## 2022-12-09 DIAGNOSIS — I2510 Atherosclerotic heart disease of native coronary artery without angina pectoris: Secondary | ICD-10-CM | POA: Diagnosis present

## 2022-12-09 DIAGNOSIS — R4182 Altered mental status, unspecified: Secondary | ICD-10-CM | POA: Diagnosis not present

## 2022-12-09 DIAGNOSIS — Z7984 Long term (current) use of oral hypoglycemic drugs: Secondary | ICD-10-CM | POA: Diagnosis not present

## 2022-12-09 DIAGNOSIS — R531 Weakness: Secondary | ICD-10-CM | POA: Diagnosis present

## 2022-12-09 DIAGNOSIS — R001 Bradycardia, unspecified: Secondary | ICD-10-CM | POA: Diagnosis not present

## 2022-12-09 DIAGNOSIS — F32A Depression, unspecified: Secondary | ICD-10-CM | POA: Diagnosis not present

## 2022-12-09 DIAGNOSIS — R410 Disorientation, unspecified: Secondary | ICD-10-CM | POA: Diagnosis not present

## 2022-12-09 DIAGNOSIS — K7290 Hepatic failure, unspecified without coma: Secondary | ICD-10-CM | POA: Diagnosis present

## 2022-12-09 DIAGNOSIS — B182 Chronic viral hepatitis C: Secondary | ICD-10-CM | POA: Diagnosis not present

## 2022-12-09 DIAGNOSIS — Z955 Presence of coronary angioplasty implant and graft: Secondary | ICD-10-CM | POA: Diagnosis not present

## 2022-12-09 DIAGNOSIS — E860 Dehydration: Secondary | ICD-10-CM | POA: Diagnosis present

## 2022-12-09 DIAGNOSIS — K7682 Hepatic encephalopathy: Secondary | ICD-10-CM | POA: Diagnosis present

## 2022-12-09 DIAGNOSIS — R231 Pallor: Secondary | ICD-10-CM | POA: Diagnosis not present

## 2022-12-09 DIAGNOSIS — D631 Anemia in chronic kidney disease: Secondary | ICD-10-CM | POA: Diagnosis not present

## 2022-12-09 DIAGNOSIS — W19XXXA Unspecified fall, initial encounter: Secondary | ICD-10-CM | POA: Diagnosis not present

## 2022-12-09 DIAGNOSIS — N400 Enlarged prostate without lower urinary tract symptoms: Secondary | ICD-10-CM | POA: Diagnosis present

## 2022-12-09 DIAGNOSIS — N179 Acute kidney failure, unspecified: Secondary | ICD-10-CM | POA: Diagnosis not present

## 2022-12-09 DIAGNOSIS — N1832 Chronic kidney disease, stage 3b: Secondary | ICD-10-CM | POA: Diagnosis present

## 2022-12-09 DIAGNOSIS — I129 Hypertensive chronic kidney disease with stage 1 through stage 4 chronic kidney disease, or unspecified chronic kidney disease: Secondary | ICD-10-CM | POA: Diagnosis present

## 2022-12-09 DIAGNOSIS — E1122 Type 2 diabetes mellitus with diabetic chronic kidney disease: Secondary | ICD-10-CM | POA: Diagnosis present

## 2022-12-09 DIAGNOSIS — Z8701 Personal history of pneumonia (recurrent): Secondary | ICD-10-CM | POA: Diagnosis not present

## 2022-12-09 DIAGNOSIS — E1165 Type 2 diabetes mellitus with hyperglycemia: Secondary | ICD-10-CM | POA: Diagnosis present

## 2022-12-09 DIAGNOSIS — M1990 Unspecified osteoarthritis, unspecified site: Secondary | ICD-10-CM | POA: Diagnosis present

## 2022-12-09 DIAGNOSIS — Z1152 Encounter for screening for COVID-19: Secondary | ICD-10-CM | POA: Diagnosis not present

## 2022-12-09 DIAGNOSIS — I252 Old myocardial infarction: Secondary | ICD-10-CM | POA: Diagnosis not present

## 2022-12-09 DIAGNOSIS — Z79899 Other long term (current) drug therapy: Secondary | ICD-10-CM | POA: Diagnosis not present

## 2022-12-09 DIAGNOSIS — R110 Nausea: Secondary | ICD-10-CM | POA: Diagnosis not present

## 2022-12-09 DIAGNOSIS — I959 Hypotension, unspecified: Secondary | ICD-10-CM | POA: Diagnosis not present

## 2022-12-09 DIAGNOSIS — K219 Gastro-esophageal reflux disease without esophagitis: Secondary | ICD-10-CM | POA: Diagnosis not present

## 2022-12-10 DIAGNOSIS — E860 Dehydration: Secondary | ICD-10-CM | POA: Diagnosis not present

## 2022-12-10 DIAGNOSIS — N179 Acute kidney failure, unspecified: Secondary | ICD-10-CM | POA: Diagnosis not present

## 2022-12-10 DIAGNOSIS — K7682 Hepatic encephalopathy: Secondary | ICD-10-CM | POA: Diagnosis not present

## 2022-12-11 DIAGNOSIS — R350 Frequency of micturition: Secondary | ICD-10-CM | POA: Diagnosis not present

## 2022-12-11 DIAGNOSIS — N401 Enlarged prostate with lower urinary tract symptoms: Secondary | ICD-10-CM | POA: Diagnosis not present

## 2022-12-11 DIAGNOSIS — R3912 Poor urinary stream: Secondary | ICD-10-CM | POA: Diagnosis not present

## 2022-12-11 DIAGNOSIS — R310 Gross hematuria: Secondary | ICD-10-CM | POA: Diagnosis not present

## 2022-12-12 ENCOUNTER — Ambulatory Visit: Payer: Medicare Other | Admitting: Internal Medicine

## 2022-12-12 ENCOUNTER — Other Ambulatory Visit: Payer: Self-pay | Admitting: Internal Medicine

## 2022-12-12 ENCOUNTER — Encounter: Payer: Self-pay | Admitting: Internal Medicine

## 2022-12-12 VITALS — BP 110/60 | HR 60 | Temp 98.6°F | Resp 18 | Ht 69.0 in | Wt 189.0 lb

## 2022-12-12 DIAGNOSIS — E119 Type 2 diabetes mellitus without complications: Secondary | ICD-10-CM | POA: Diagnosis not present

## 2022-12-12 DIAGNOSIS — N1832 Chronic kidney disease, stage 3b: Secondary | ICD-10-CM | POA: Diagnosis not present

## 2022-12-12 DIAGNOSIS — E1140 Type 2 diabetes mellitus with diabetic neuropathy, unspecified: Secondary | ICD-10-CM

## 2022-12-12 NOTE — Progress Notes (Signed)
   Office Visit  Subjective   Patient ID: Matthew Ferguson   DOB: 1946-01-10   Age: 77 y.o.   MRN: [id number]   Chief Complaint Chief Complaint  Patient presents with   Follow-up    Chronic kidney disease Type 2 Diabetes     History of Present Illness The patient is a 77 year old Caucasian/White male who presents with follow up. his sugar been better.   He denies any further significant symptoms. He denies foot ulcers and recurrent urinary tract infections.  He states he exercises infrequently.  The patient's past medical history is: Angina, Stable, Diabetes Mellitus, Type II, and Hepatitis. There is no history of foot ulcers and pancreatitis.  His last eye exam was last year and is schedule first week of December.   He does not do regular foot exam.   Past Medical History Past Medical History:  Diagnosis Date   Ascites    Chronic kidney disease    Cirrhosis ([place])    Complication of anesthesia    Post operative vomiting for "2 weeks" per pt after cholecystectomy   Coronary artery disease involving native coronary artery 05/29/2015   05/08/12 had PCI and Xience stent to RCA for Troponin normal unstable angina with residual 40-50% proximal and 30-40 % mid LAD stenosis, EF normal 55-60%   Depression    Essential hypertension 05/29/2015   GERD (gastroesophageal reflux disease)    Hepatitis-C 03/31/2021   undetectable virus on 03/31/2021.     Irritable bowel syndrome    Mixed hyperlipidemia 05/29/2015   Palpitations    PONV (postoperative nausea and vomiting)    Tinnitus    Type 2 diabetes mellitus ([place]) 05/29/2015     Allergies No Known Allergies   Review of Systems Review of Systems  Constitutional: Negative.   Respiratory: Negative.    Cardiovascular: Negative.        Objective:    Vitals BP 110/60 (BP Location: Left Arm, Patient Position: Sitting, Cuff Size: Normal)   Pulse 60   Temp 98.6 F (37 C)   Resp 18   Ht 5' 9"$  (1.753 m)   Wt 189 lb (85.7 kg)   SpO2  96%   BMI 27.91 kg/m    Physical Examination Physical Exam Constitutional:      Appearance: Normal appearance.  Cardiovascular:     Rate and Rhythm: Normal rate and regular rhythm.     Heart sounds: Normal heart sounds.  Pulmonary:     Effort: Pulmonary effort is normal.     Breath sounds: Normal breath sounds.  Abdominal:     Palpations: Abdomen is soft.  Neurological:     General: No focal deficit present.     Mental Status: He is alert.        Assessment & Plan:   Type 2 diabetes mellitus with diabetic neuropathy, unspecified ([place]) He is on Jordiance and I will do HbA1c today    Return in about 1 month (around 01/10/2023).   Garwin Brothers, MD

## 2022-12-12 NOTE — Assessment & Plan Note (Signed)
He is on Jordiance and I will do HbA1c today

## 2022-12-13 LAB — HGB A1C W/O EAG: Hgb A1c MFr Bld: 8.7 % — ABNORMAL HIGH (ref 4.8–5.6)

## 2022-12-15 ENCOUNTER — Other Ambulatory Visit: Payer: Self-pay

## 2022-12-15 MED ORDER — LANTUS SOLOSTAR 100 UNIT/ML ~~LOC~~ SOPN: 7.0000 [IU] | PEN_INJECTOR | Freq: Every day | SUBCUTANEOUS | 11 refills | Status: DC

## 2022-12-15 NOTE — Progress Notes (Signed)
Patient called.  Patient aware.  

## 2022-12-18 ENCOUNTER — Ambulatory Visit: Payer: Medicare Other | Admitting: Podiatry

## 2022-12-19 ENCOUNTER — Ambulatory Visit (INDEPENDENT_AMBULATORY_CARE_PROVIDER_SITE_OTHER): Payer: Medicare Other | Admitting: Podiatry

## 2022-12-19 ENCOUNTER — Other Ambulatory Visit: Payer: Self-pay

## 2022-12-19 DIAGNOSIS — B351 Tinea unguium: Secondary | ICD-10-CM | POA: Diagnosis not present

## 2022-12-19 DIAGNOSIS — E1140 Type 2 diabetes mellitus with diabetic neuropathy, unspecified: Secondary | ICD-10-CM

## 2022-12-19 DIAGNOSIS — M2141 Flat foot [pes planus] (acquired), right foot: Secondary | ICD-10-CM

## 2022-12-19 DIAGNOSIS — M79674 Pain in right toe(s): Secondary | ICD-10-CM

## 2022-12-19 DIAGNOSIS — M2142 Flat foot [pes planus] (acquired), left foot: Secondary | ICD-10-CM

## 2022-12-19 DIAGNOSIS — M79675 Pain in left toe(s): Secondary | ICD-10-CM

## 2022-12-19 MED ORDER — INSULIN PEN NEEDLE 31G X 8 MM MISC: 0 refills | Status: DC

## 2022-12-19 NOTE — Progress Notes (Signed)
  Subjective:  Patient ID: Matthew Ferguson, male    DOB: 1946-06-25,  MRN: [id number]  Chief Complaint  Patient presents with   Nail Problem    Diabetic Foot Care     77 y.o. male presents with the above complaint. History confirmed with patient. Patient presenting with pain related to dystrophic thickened elongated nails. Patient is unable to trim own nails related to nail dystrophy and/or mobility issues. Patient does have a history of T2DM.   Objective:  Physical Exam: warm, good capillary refill nail exam onychomycosis of the toenails, onycholysis, and dystrophic nails DP pulses palpable, PT pulses palpable, and protective sensation absent Left Foot:  Pain with palpation of nails due to elongation and dystrophic growth.  Right Foot: Pain with palpation of nails due to elongation and dystrophic growth.   Assessment:   1. Pain due to onychomycosis of toenails of both feet   2. Type 2 diabetes mellitus with diabetic neuropathy, without long-term current use of insulin ([place])   3. Pes planus of both feet      Plan:  Patient was evaluated and treated and all questions answered.   #Onychomycosis with pain  -Nails palliatively debrided as below. -Educated on self-care  Procedure: Nail Debridement Rationale: Pain Type of Debridement: manual, sharp debridement. Instrumentation: Nail nipper, rotary burr. Number of Nails: 10  No follow-ups on file.         Everitt Amber, DPM [facility]

## 2022-12-22 ENCOUNTER — Other Ambulatory Visit: Payer: Self-pay

## 2022-12-22 MED ORDER — INSULIN PEN NEEDLE 31G X 8 MM MISC: 4 refills | Status: DC

## 2022-12-23 DIAGNOSIS — R609 Edema, unspecified: Secondary | ICD-10-CM | POA: Diagnosis not present

## 2022-12-23 DIAGNOSIS — R319 Hematuria, unspecified: Secondary | ICD-10-CM | POA: Diagnosis not present

## 2022-12-23 DIAGNOSIS — K7460 Unspecified cirrhosis of liver: Secondary | ICD-10-CM | POA: Diagnosis not present

## 2022-12-23 DIAGNOSIS — N1832 Chronic kidney disease, stage 3b: Secondary | ICD-10-CM | POA: Diagnosis not present

## 2022-12-23 DIAGNOSIS — D631 Anemia in chronic kidney disease: Secondary | ICD-10-CM | POA: Diagnosis not present

## 2022-12-23 DIAGNOSIS — E1122 Type 2 diabetes mellitus with diabetic chronic kidney disease: Secondary | ICD-10-CM | POA: Diagnosis not present

## 2022-12-23 DIAGNOSIS — I129 Hypertensive chronic kidney disease with stage 1 through stage 4 chronic kidney disease, or unspecified chronic kidney disease: Secondary | ICD-10-CM | POA: Diagnosis not present

## 2022-12-23 DIAGNOSIS — N2581 Secondary hyperparathyroidism of renal origin: Secondary | ICD-10-CM | POA: Diagnosis not present

## 2022-12-23 DIAGNOSIS — N189 Chronic kidney disease, unspecified: Secondary | ICD-10-CM | POA: Diagnosis not present

## 2022-12-26 ENCOUNTER — Ambulatory Visit: Payer: Medicare Other | Admitting: Internal Medicine

## 2022-12-26 ENCOUNTER — Encounter: Payer: Self-pay | Admitting: Internal Medicine

## 2022-12-26 VITALS — BP 118/62 | HR 71 | Temp 98.4°F | Resp 18 | Ht 69.0 in | Wt 211.1 lb

## 2022-12-26 DIAGNOSIS — R600 Localized edema: Secondary | ICD-10-CM

## 2022-12-26 DIAGNOSIS — K7682 Hepatic encephalopathy: Secondary | ICD-10-CM

## 2022-12-26 HISTORY — DX: Localized edema: R60.0

## 2022-12-26 HISTORY — DX: Hepatic encephalopathy: K76.82

## 2022-12-26 MED ORDER — RIFAXIMIN 550 MG PO TABS
550.0000 mg | ORAL_TABLET | Freq: Two times a day (BID) | ORAL | 2 refills | Status: DC
Start: 2022-12-26 — End: 2023-03-24

## 2022-12-26 NOTE — Assessment & Plan Note (Signed)
He will increase lasix to 40 mg daily and I will discuss with Nephrologist if we can add spironolactone because of CKD

## 2022-12-26 NOTE — Assessment & Plan Note (Signed)
Continue with rifaximin and lactulose, will try to get prior authorization.

## 2022-12-26 NOTE — Progress Notes (Signed)
   Acute Office Visit  Subjective:     Patient ID: Matthew Ferguson, male    DOB: January 10, 1946, 77 y.o.   MRN: [id number]  Chief Complaint  Patient presents with   Follow-up    Low BP leg and feet swelling      HPI Patient is in today for increase swelling and weight gain. He take lasix 20 mg daily. He has CKD IV and follows with [facility]. HE HAS GAINED 22 POUNDS SINCE LAST VISIT THAT WAS LESS THAN 2 WEEKS AGO.   He also history of cirrhosis of liver and hepatic encephalopathy, he takes Lactulose and Rifaximin but he could not get it because he need prior authorization.   Review of Systems  Constitutional: Negative.   Respiratory:  Positive for shortness of breath.   Cardiovascular:  Positive for leg swelling.        Objective:    BP 118/62 (BP Location: Left Arm, Patient Position: Sitting, Cuff Size: Normal)   Pulse 71   Temp 98.4 F (36.9 C)   Resp 18   Ht 5' 9"$  (1.753 m)   Wt 211 lb 2 oz (95.8 kg)   SpO2 96%   BMI 31.18 kg/m    Physical Exam Constitutional:      Appearance: Normal appearance.  Cardiovascular:     Rate and Rhythm: Normal rate and regular rhythm.     Heart sounds: Normal heart sounds.  Pulmonary:     Effort: Pulmonary effort is normal.     Breath sounds: Normal breath sounds.  Musculoskeletal:     Right lower leg: Edema present.     Left lower leg: Edema present.  Neurological:     Mental Status: He is alert.     No results found for any visits on 12/26/22.      Assessment & Plan:   Problem List Items Addressed This Visit       Nervous and Auditory   Hepatic encephalopathy ([place]) - Primary    Continue with rifaximin and lactulose, will try to get prior authorization.         Other   Bilateral leg edema    He will increase lasix to 40 mg daily and I will discuss with Nephrologist if we can add spironolactone because of CKD       No orders of the defined types were placed in this encounter.   No follow-ups on  file.  Garwin Brothers, MD

## 2022-12-31 ENCOUNTER — Other Ambulatory Visit: Payer: Self-pay | Admitting: Interventional Radiology

## 2022-12-31 DIAGNOSIS — K7030 Alcoholic cirrhosis of liver without ascites: Secondary | ICD-10-CM

## 2023-01-05 ENCOUNTER — Telehealth: Payer: Self-pay | Admitting: Gastroenterology

## 2023-01-05 ENCOUNTER — Other Ambulatory Visit: Payer: Self-pay

## 2023-01-05 MED ORDER — LACTULOSE 10 GM/15ML PO SOLN: ORAL | 1 refills | Status: DC

## 2023-01-05 NOTE — Telephone Encounter (Signed)
Inbound call from patient, is wishing a refill on Lactulose, stating he gets his medication through [facility]. Please advise.

## 2023-01-05 NOTE — Telephone Encounter (Signed)
Refills have been sent as requested.

## 2023-01-09 ENCOUNTER — Ambulatory Visit: Payer: Medicare Other | Admitting: Internal Medicine

## 2023-01-12 ENCOUNTER — Ambulatory Visit
Admission: RE | Admit: 2023-01-12 | Discharge: 2023-01-12 | Disposition: A | Discharge status: Home / Self Care | Payer: Medicare Other | Source: Ambulatory Visit | Attending: Interventional Radiology | Admitting: Interventional Radiology

## 2023-01-12 DIAGNOSIS — K7030 Alcoholic cirrhosis of liver without ascites: Secondary | ICD-10-CM

## 2023-01-12 DIAGNOSIS — K766 Portal hypertension: Secondary | ICD-10-CM | POA: Diagnosis not present

## 2023-01-12 HISTORY — PX: IR RADIOLOGIST EVAL & MGMT: IMG5224

## 2023-01-12 NOTE — Progress Notes (Signed)
Referring Physician(s): Wilfrid Lund, MD   Reason for follow up:  9 month status post TIPS creation   History of present illness: 77 y.o. male with history of cryptogenic cirrhosis (Child Pugh B, MELD 16) with portal hypertension and recurrent ascites in the setting of decreased diuresis limited by renal function.  He is status post TIPS creation on 03/17/22 which was uncomplicated and achieved a portosystemic mean pressure gradient reduction from 15 to 7 mmHg without post-deployment balloon dilation.    He presents today via telephone virtual clinic visit.     At his previous visit he was experiencing memory problems, requiring walker to ambulate, and overall described as  "being in a haze".  He had been taken off his lactulose by his Nephrologist due to labile blood glucose.  This has since been resumed and his mental function has improved significantly.  He is back on diuretics taking furosemide 20 mg BID and spironolactone 50 mg every other day.  He has had no recurrence of ascites.  He is taking lactulose TID without encephalopathy.  He has new left leg swelling which is his main complaint.  His wife has increased his furosemide from 20 to 40 mg/day with some improvement in the swelling.    Past Medical History:  Diagnosis Date   Ascites    Chronic kidney disease    Cirrhosis ([place])    Complication of anesthesia    Post operative vomiting for "2 weeks" per pt after cholecystectomy   Coronary artery disease involving native coronary artery 05/29/2015   05/08/12 had PCI and Xience stent to RCA for Troponin normal unstable angina with residual 40-50% proximal and 30-40 % mid LAD stenosis, EF normal 55-60%   Depression    Essential hypertension 05/29/2015   GERD (gastroesophageal reflux disease)    Hepatitis-C 03/31/2021   undetectable virus on 03/31/2021.     Irritable bowel syndrome    Mixed hyperlipidemia 05/29/2015   Palpitations    PONV (postoperative nausea and vomiting)     Tinnitus    Type 2 diabetes mellitus ([place]) 05/29/2015    Past Surgical History:  Procedure Laterality Date   BACK SURGERY     CARDIAC CATHETERIZATION     CHOLECYSTECTOMY     COLONOSCOPY WITH PROPOFOL N/A 04/02/2021   Procedure: COLONOSCOPY WITH PROPOFOL;  Surgeon: Irene Shipper, MD;  Location: [facility] ENDOSCOPY;  Service: Endoscopy;  Laterality: N/A;   CORONARY ANGIOPLASTY WITH STENT PLACEMENT     ESOPHAGOGASTRODUODENOSCOPY (EGD) WITH PROPOFOL N/A 04/01/2021   Procedure: ESOPHAGOGASTRODUODENOSCOPY (EGD) WITH PROPOFOL;  Surgeon: Doran Stabler, MD;  Location: [facility];  Service: Gastroenterology;  Laterality: N/A;   INGUINAL HERNIA REPAIR Bilateral 01/2021   IR INTRAVASCULAR ULTRASOUND NON CORONARY  03/17/2022   IR PARACENTESIS  10/17/2021   IR PARACENTESIS  01/24/2022   IR PARACENTESIS  02/19/2022   IR PARACENTESIS  03/05/2022   IR PARACENTESIS  03/17/2022   IR PARACENTESIS  05/05/2022   IR RADIOLOGIST EVAL & MGMT  02/24/2022   IR RADIOLOGIST EVAL & MGMT  04/17/2022   IR RADIOLOGIST EVAL & MGMT  05/14/2022   IR RADIOLOGIST EVAL & MGMT  07/23/2022   IR TIPS  03/17/2022   IR US GUIDE VASC ACCESS RIGHT  03/17/2022   KNEE SURGERY     RADIOLOGY WITH ANESTHESIA N/A 03/17/2022   Procedure: TIPS;  Surgeon: Suzette Battiest, MD;  Location: [facility];  Service: Radiology;  Laterality: N/A;    Allergies: Patient has  no known allergies.  Medications: Prior to Admission medications   Medication Sig Start Date End Date Taking? Authorizing Provider  acebutolol (SECTRAL) 200 MG capsule Take 1 capsule (200 mg total) by mouth daily. Patient taking differently: Take 200 mg by mouth every evening. 02/11/22   Richardo Priest, MD  fluticasone (FLONASE) 50 MCG/ACT nasal spray Place into the nose. 02/28/22   [provider]  furosemide (LASIX) 20 MG tablet Take 1 tablet (20 mg total) by mouth every other day. 09/15/22   Garwin Brothers, MD  glimepiride (AMARYL) 2 MG tablet Take 1 tablet (2 mg total) by mouth  daily. 11/14/22   Garwin Brothers, MD  insulin glargine (LANTUS SOLOSTAR) 100 UNIT/ML Solostar Pen Inject 7 Units into the skin daily. 12/15/22   Garwin Brothers, MD  Insulin Pen Needle 31G X 8 MM MISC Use with Lantus pen as directed. 12/22/22   Garwin Brothers, MD  isosorbide mononitrate (IMDUR) 30 MG 24 hr tablet TAKE 1 TABLET DAILY 08/12/22   Richardo Priest, MD  lactulose [facility]) 10 GM/15ML solution Take 72m once a day 01/05/23   DDoran Stabler MD  Microlet Lancets MISC  11/22/21   [provider]  neomycin-polymyxin-hydrocortisone (CORTISPORIN) OTIC solution Apply 1 drop to both big toes at painful corners after bath/shower once a day until pain is better 10/24/22   AGarwin Brothers MD  nitroGLYCERIN (NITROSTAT) 0.4 MG SL tablet Place 1 tablet (0.4 mg total) under the tongue every 5 (five) minutes as needed for chest pain. 08/26/22   MRichardo Priest MD  pantoprazole (PROTONIX) 40 MG tablet Take 1 tablet (40 mg total) by mouth in the morning. 11/14/22   AGarwin Brothers MD  rifaximin (XIFAXAN) 550 MG TABS tablet Take 1 tablet (550 mg total) by mouth 2 (two) times daily. 12/26/22   AGarwin Brothers MD  sertraline (ZOLOFT) 25 MG tablet Take 1 tablet by mouth daily. 02/14/22   [provider]     Family History  Problem Relation Age of Onset   Other Mother        died with old age   Stroke Father    Diabetes Father    Heart disease Father    Congenital heart disease Daughter    Colon cancer Neg Hx    Esophageal cancer Neg Hx    Pancreatic cancer Neg Hx    Stomach cancer Neg Hx     Social History   Socioeconomic History   Marital status: Married    Spouse name: PElectrical engineer  Number of children: 2   Years of education: Not on file   Highest education level: Not on file  Occupational History   Occupation: retired  Tobacco Use   Smoking status: Never    Passive exposure: Past   Smokeless tobacco: Never  Vaping Use   Vaping Use: Never used  Substance and Sexual Activity   Alcohol use: Never    Drug use: Never   Sexual activity: Not on file  Other Topics Concern   Not on file  Social History Narrative   Not on file   Social Determinants of Health   Financial Resource Strain: Not on file  Food Insecurity: Not on file  Transportation Needs: Not on file  Physical Activity: Not on file  Stress: Not on file  Social Connections: Not on file     Vital Signs: There were no vitals taken for this visit.  No physical examination was performed in lieu of virtual telephone clinic  visit.  Imaging: TIPS creation 03/17/22:  8+2 Viatorr Portosystemic gradient of 15 mm Hg (absolute portal venous pressure 28 mm Hg) before shunt placement and 7 mm Hg (absolute portal venous pressure 19 mm Hg) after shunt placement.   TIPS Duplex 11/07/22  Patent TIPS, no ascites   Labs:  CBC: Recent Labs    03/12/22 1504 03/18/22 0113 03/19/22 0557  WBC 6.5 9.6 11.7*  HGB 9.3* 10.6* 9.7*  HCT 28.3* 31.3* 28.9*  PLT 134* 133* 139*    COAGS: Recent Labs    03/17/22 0753 03/18/22 0113 03/19/22 0557 10/20/22 1039  INR 1.3* 1.4* 1.5* 1.1*    BMP: Recent Labs    03/12/22 1504 03/18/22 0113 03/19/22 0557 05/02/22 1422 10/20/22 1039  NA 137 133* 133* 136  --   K 4.1 4.5 4.0 4.2  --   CL 110 104 104 105  --   CO2 22 17* 21* 26  --   GLUCOSE 212* 177* 172* 198*  --   BUN 16 15 24* 16  --   CALCIUM 8.6* 8.6* 8.5* 8.3*  --   CREATININE 1.79* 1.54* 1.81* 1.59* 2.36*  GFRNONAA 39* 46* 38*  --   --     LIVER FUNCTION TESTS: Recent Labs    03/12/22 1504 03/18/22 0113 03/19/22 0557 05/02/22 1422 10/20/22 1039  BILITOT 0.8 2.5* 1.8* 0.9 0.9  AST 20 83* 210* 24  --   ALT 12 55* 153* 11  --   ALKPHOS 92 107 107 150*  --   PROT 6.2* 6.1* 5.5* 6.0  --   ALBUMIN 2.8* 2.8* 2.6* 2.7*  --     Assessment and Plan: 77 y.o. male with history of cryptogenic cirrhosis (Child Pugh B, MELD 16 pre TIPS --> 13 current) with portal hypertension and recurrent ascites in the setting of  decreased diuresis limited by renal function.  He is recovering well status post TIPS creation on 03/17/22 which was uncomplicated and achieved a portosystemic mean pressure gradient reduction from 15 to 7 mmHg without post-deployment balloon dilation. Ascites formation has completely stopped.  He has lower extremity third spacing, most prominent in his left lower extremity   -continue lactulose, titrating to 3-4 bowel movements per day -increase spironolactone 50 mg from every other day to every day given improved renal function and persistent lower extremity swelling.  Continue furosemide 40 mg QD. -follow up in 6 months   Ruthann Cancer, MD Pager: [phone] Clinic: [phone]    I spent a total of 25 Minutes in virtual telephone clinical consultation, greater than 50% of which was counseling/coordinating care for portal hypertension.

## 2023-02-01 ENCOUNTER — Other Ambulatory Visit: Payer: Self-pay | Admitting: Internal Medicine

## 2023-02-01 ENCOUNTER — Other Ambulatory Visit: Payer: Self-pay | Admitting: Cardiology

## 2023-02-02 NOTE — Telephone Encounter (Signed)
Refills to pharmacy 

## 2023-02-04 ENCOUNTER — Other Ambulatory Visit: Payer: Self-pay | Admitting: Internal Medicine

## 2023-02-10 DIAGNOSIS — H2512 Age-related nuclear cataract, left eye: Secondary | ICD-10-CM | POA: Diagnosis not present

## 2023-02-10 DIAGNOSIS — H2513 Age-related nuclear cataract, bilateral: Secondary | ICD-10-CM | POA: Diagnosis not present

## 2023-02-10 DIAGNOSIS — H25013 Cortical age-related cataract, bilateral: Secondary | ICD-10-CM | POA: Diagnosis not present

## 2023-02-10 DIAGNOSIS — H18413 Arcus senilis, bilateral: Secondary | ICD-10-CM | POA: Diagnosis not present

## 2023-02-10 DIAGNOSIS — H25043 Posterior subcapsular polar age-related cataract, bilateral: Secondary | ICD-10-CM | POA: Diagnosis not present

## 2023-02-12 DIAGNOSIS — N1832 Chronic kidney disease, stage 3b: Secondary | ICD-10-CM | POA: Diagnosis not present

## 2023-02-23 ENCOUNTER — Other Ambulatory Visit: Payer: Self-pay

## 2023-02-23 MED ORDER — LACTULOSE 10 GM/15ML PO SOLN: 10.0000 g | Freq: Three times a day (TID) | ORAL | 1 refills | Status: DC

## 2023-02-26 ENCOUNTER — Other Ambulatory Visit: Payer: Self-pay

## 2023-02-26 MED ORDER — LACTULOSE 10 GM/15ML PO SOLN: 10.0000 g | Freq: Three times a day (TID) | ORAL | 1 refills | Status: DC

## 2023-03-04 ENCOUNTER — Other Ambulatory Visit: Payer: Self-pay

## 2023-03-04 DIAGNOSIS — R188 Other ascites: Secondary | ICD-10-CM

## 2023-03-04 MED ORDER — FUROSEMIDE 20 MG PO TABS: 20.0000 mg | ORAL_TABLET | ORAL | 1 refills | Status: DC

## 2023-03-04 MED ORDER — SPIRONOLACTONE 25 MG PO TABS: 25.0000 mg | ORAL_TABLET | Freq: Every day | ORAL | 1 refills | Status: DC

## 2023-03-17 ENCOUNTER — Ambulatory Visit (INDEPENDENT_AMBULATORY_CARE_PROVIDER_SITE_OTHER): Payer: Medicare Other | Admitting: Podiatry

## 2023-03-17 DIAGNOSIS — B351 Tinea unguium: Secondary | ICD-10-CM | POA: Diagnosis not present

## 2023-03-17 DIAGNOSIS — E1140 Type 2 diabetes mellitus with diabetic neuropathy, unspecified: Secondary | ICD-10-CM

## 2023-03-17 DIAGNOSIS — M79675 Pain in left toe(s): Secondary | ICD-10-CM

## 2023-03-17 DIAGNOSIS — M79674 Pain in right toe(s): Secondary | ICD-10-CM

## 2023-03-17 NOTE — Progress Notes (Signed)
  Subjective:  Patient ID: Matthew Ferguson, male    DOB: 01-06-1946,  MRN: [id number]  Chief Complaint  Patient presents with   diabetic foot care     Last seen PCP Amin 2 months ago A1C 8.3 BS 105    77 y.o. male presents with the above complaint. History confirmed with patient. Patient presenting with pain related to dystrophic thickened elongated nails. Patient is unable to trim own nails related to nail dystrophy and/or mobility issues. Patient does have a history of T2DM.   Objective:  Physical Exam: warm, good capillary refill nail exam onychomycosis of the toenails, onycholysis, and dystrophic nails DP pulses palpable, PT pulses palpable, and protective sensation absent Left Foot:  Pain with palpation of nails due to elongation and dystrophic growth.  Right Foot: Pain with palpation of nails due to elongation and dystrophic growth.   Assessment:   1. Pain due to onychomycosis of toenails of both feet   2. Type 2 diabetes mellitus with diabetic neuropathy, without long-term current use of insulin ([facility])       Plan:  Patient was evaluated and treated and all questions answered.   #Onychomycosis with pain  -Nails palliatively debrided as below. -Educated on self-care  Procedure: Nail Debridement Rationale: Pain Type of Debridement: manual, sharp debridement. Instrumentation: Nail nipper, rotary burr. Number of Nails: 10  Return in about 3 months (around 06/17/2023) for [facility]         Corinna Gab, DPM [facility] / [facility]

## 2023-03-24 ENCOUNTER — Other Ambulatory Visit: Payer: Self-pay

## 2023-03-24 DIAGNOSIS — E1122 Type 2 diabetes mellitus with diabetic chronic kidney disease: Secondary | ICD-10-CM | POA: Diagnosis not present

## 2023-03-24 DIAGNOSIS — R609 Edema, unspecified: Secondary | ICD-10-CM | POA: Diagnosis not present

## 2023-03-24 DIAGNOSIS — D631 Anemia in chronic kidney disease: Secondary | ICD-10-CM | POA: Diagnosis not present

## 2023-03-24 DIAGNOSIS — N2581 Secondary hyperparathyroidism of renal origin: Secondary | ICD-10-CM | POA: Diagnosis not present

## 2023-03-24 DIAGNOSIS — K7460 Unspecified cirrhosis of liver: Secondary | ICD-10-CM | POA: Diagnosis not present

## 2023-03-24 DIAGNOSIS — N1832 Chronic kidney disease, stage 3b: Secondary | ICD-10-CM | POA: Diagnosis not present

## 2023-03-24 DIAGNOSIS — I129 Hypertensive chronic kidney disease with stage 1 through stage 4 chronic kidney disease, or unspecified chronic kidney disease: Secondary | ICD-10-CM | POA: Diagnosis not present

## 2023-03-24 MED ORDER — RIFAXIMIN 550 MG PO TABS: 550.0000 mg | ORAL_TABLET | Freq: Two times a day (BID) | ORAL | 2 refills | Status: DC

## 2023-04-13 ENCOUNTER — Ambulatory Visit (INDEPENDENT_AMBULATORY_CARE_PROVIDER_SITE_OTHER): Payer: Medicare Other | Admitting: Podiatry

## 2023-04-13 DIAGNOSIS — S99922A Unspecified injury of left foot, initial encounter: Secondary | ICD-10-CM | POA: Diagnosis not present

## 2023-04-13 NOTE — Progress Notes (Signed)
  Subjective:  Patient ID: Matthew Ferguson, male    DOB: 1946-08-28,  MRN: [id number]  Chief Complaint  Patient presents with   Foot Problem    Patient has a bruise to his 4th toe and nail on the left foot. Patient is unable to say if is painful due to having neuropathy. Patient noticed bruise on Saturday. No known injuries.     77 y.o. male presents today with some purplish discoloration to the medial border of the fourth toe of the left foot.  Patient does have a history of neuropathy.  He does have diabetes.  He noticed a bruise on Saturday.  Not sure how it started.  May have hit the toe and not notice it due to neuropathy.  Denies any drainage or open wound.  Purplish discoloration  Past Medical History:  Diagnosis Date   Ascites    Chronic kidney disease    Cirrhosis (HCC)    Complication of anesthesia    Post operative vomiting for "2 weeks" per pt after cholecystectomy   Coronary artery disease involving native coronary artery 05/29/2015   05/08/12 had PCI and Xience stent to RCA for Troponin normal unstable angina with residual 40-50% proximal and 30-40 % mid LAD stenosis, EF normal 55-60%   Depression    Essential hypertension 05/29/2015   GERD (gastroesophageal reflux disease)    Hepatitis-C 03/31/2021   undetectable virus on 03/31/2021.     Irritable bowel syndrome    Mixed hyperlipidemia 05/29/2015   Palpitations    PONV (postoperative nausea and vomiting)    Tinnitus    Type 2 diabetes mellitus (HCC) 05/29/2015    No Known Allergies  ROS: Negative except as per HPI above  Objective:  General: AAO x3, NAD  Dermatological: Consistent with ecchymosis at the medial aspect of the left fourth toe.  No open wound.  Vascular:  Dorsalis Pedis artery and Posterior Tibial artery pedal pulses are 2/4 bilateral.  Capillary fill time < 3 sec to all digits.   Neruologic: Grossly absent via light touch to the toes of the foot  Musculoskeletal: No gross boney pedal deformities  bilateral. No pain, crepitus, or limitation noted with foot and ankle range of motion bilateral. Muscular strength 5/5 in all groups tested bilateral.  Gait: Unassisted, Nonantalgic.   No images are attached to the encounter.  Radiographs:  Deferred Assessment:   1. Injury of toe on left foot, initial encounter      Plan:  Patient was evaluated and treated and all questions answered.  # Stub toe on left foot fourth toe with bruising -Discussed with patient there is no evidence of vascular issue -Likely be stopped in stone did not notice it due to neuropathy -Rec continue monitoring -Patient will call if there is any further issues with the toe but it seems to be improving with conservative management.  Return if symptoms worsen or fail to improve.          Corinna Gab, DPM [facility] / [facility]

## 2023-04-16 ENCOUNTER — Other Ambulatory Visit: Payer: Self-pay | Admitting: Gastroenterology

## 2023-04-16 ENCOUNTER — Other Ambulatory Visit: Payer: Self-pay | Admitting: Cardiology

## 2023-04-16 NOTE — Telephone Encounter (Signed)
What dose of spironolactone is this patient to be on? Refill request for 100mg  tablets med list states 25mg  a day. Please clarify the dosing.

## 2023-04-17 NOTE — Telephone Encounter (Signed)
Thank you for checking  This patient's diuretics are actually managed by his nephrologist, Dr. Anthony Sar, who I believe saw him fairly recently.  This patient and his pharmacy need to consult Dr. Thedore Mins regarding dosing of his spironolactone and/or furosemide.   - HD

## 2023-04-23 DIAGNOSIS — N1832 Chronic kidney disease, stage 3b: Secondary | ICD-10-CM | POA: Diagnosis not present

## 2023-04-23 DIAGNOSIS — N189 Chronic kidney disease, unspecified: Secondary | ICD-10-CM | POA: Diagnosis not present

## 2023-04-23 DIAGNOSIS — K7460 Unspecified cirrhosis of liver: Secondary | ICD-10-CM | POA: Diagnosis not present

## 2023-04-23 DIAGNOSIS — D631 Anemia in chronic kidney disease: Secondary | ICD-10-CM | POA: Diagnosis not present

## 2023-04-23 DIAGNOSIS — I129 Hypertensive chronic kidney disease with stage 1 through stage 4 chronic kidney disease, or unspecified chronic kidney disease: Secondary | ICD-10-CM | POA: Diagnosis not present

## 2023-04-23 DIAGNOSIS — E1122 Type 2 diabetes mellitus with diabetic chronic kidney disease: Secondary | ICD-10-CM | POA: Diagnosis not present

## 2023-04-24 DIAGNOSIS — H2513 Age-related nuclear cataract, bilateral: Secondary | ICD-10-CM | POA: Diagnosis not present

## 2023-04-24 DIAGNOSIS — H353131 Nonexudative age-related macular degeneration, bilateral, early dry stage: Secondary | ICD-10-CM | POA: Diagnosis not present

## 2023-04-24 DIAGNOSIS — E119 Type 2 diabetes mellitus without complications: Secondary | ICD-10-CM | POA: Diagnosis not present

## 2023-04-24 DIAGNOSIS — H524 Presbyopia: Secondary | ICD-10-CM | POA: Diagnosis not present

## 2023-04-24 DIAGNOSIS — H2512 Age-related nuclear cataract, left eye: Secondary | ICD-10-CM | POA: Diagnosis not present

## 2023-04-24 DIAGNOSIS — H2511 Age-related nuclear cataract, right eye: Secondary | ICD-10-CM | POA: Diagnosis not present

## 2023-04-24 DIAGNOSIS — H52223 Regular astigmatism, bilateral: Secondary | ICD-10-CM | POA: Diagnosis not present

## 2023-04-24 DIAGNOSIS — H5203 Hypermetropia, bilateral: Secondary | ICD-10-CM | POA: Diagnosis not present

## 2023-05-05 ENCOUNTER — Other Ambulatory Visit: Payer: Self-pay

## 2023-05-05 MED ORDER — ISOSORBIDE MONONITRATE ER 30 MG PO TB24: 30.0000 mg | ORAL_TABLET | Freq: Every day | ORAL | 3 refills | Status: DC

## 2023-05-05 MED ORDER — SPIRONOLACTONE 25 MG PO TABS: 25.0000 mg | ORAL_TABLET | Freq: Every day | ORAL | 1 refills | Status: DC

## 2023-05-05 MED ORDER — SERTRALINE HCL 25 MG PO TABS: 25.0000 mg | ORAL_TABLET | Freq: Every day | ORAL | 3 refills | Status: DC

## 2023-05-13 DIAGNOSIS — N1832 Chronic kidney disease, stage 3b: Secondary | ICD-10-CM | POA: Diagnosis not present

## 2023-05-22 DIAGNOSIS — H2511 Age-related nuclear cataract, right eye: Secondary | ICD-10-CM | POA: Diagnosis not present

## 2023-05-25 ENCOUNTER — Ambulatory Visit: Payer: Medicare Other | Admitting: Internal Medicine

## 2023-05-25 ENCOUNTER — Encounter: Payer: Self-pay | Admitting: Internal Medicine

## 2023-05-25 VITALS — BP 118/70 | HR 62 | Temp 98.0°F | Resp 18 | Ht 69.0 in | Wt 200.0 lb

## 2023-05-25 DIAGNOSIS — E1140 Type 2 diabetes mellitus with diabetic neuropathy, unspecified: Secondary | ICD-10-CM | POA: Diagnosis not present

## 2023-05-25 DIAGNOSIS — Z794 Long term (current) use of insulin: Secondary | ICD-10-CM

## 2023-05-25 DIAGNOSIS — N184 Chronic kidney disease, stage 4 (severe): Secondary | ICD-10-CM

## 2023-05-25 MED ORDER — INSULIN LISPRO (1 UNIT DIAL) 100 UNIT/ML (KWIKPEN): PEN_INJECTOR | SUBCUTANEOUS | 11 refills | Status: DC

## 2023-05-25 NOTE — Assessment & Plan Note (Signed)
He will increase lantus 3 units every 3rd day if fasting sugar is above 200 mg/dl. I will repeat HbA1c on next visit, I will stop glimepride and will add short acting insulin to take during lunch.

## 2023-05-25 NOTE — Progress Notes (Signed)
Office Visit  Subjective   Patient ID: Matthew Ferguson   DOB: January 18, 1946   Age: 77 y.o.   MRN: [id number]   Chief Complaint Chief Complaint  Patient presents with   Diabetes    Follow up High blood sugar     History of Present Illness The patient is a 77 year old Caucasian/White male who presents with follow up. He says that his sugar is getting worse and it stay between 200 to 500 mg/dl. He takes lantus 7 units daily, he also take glimepride 2 mg daily. He has cataract surgery few days ago in his right eye and 1 month ago, he has cataract surgery in his right eye.    He denies any further significant symptoms. He denies foot ulcers and recurrent urinary tract infections.   He states he exercises infrequently.  The patient's past medical history is: Angina, Stable, Diabetes Mellitus, Type II, and Hepatitis. There is no history of foot ulcers and pancreatitis.  He has CKD IV and he follows with [facility].   He also has cirrhosis of liver with hepatic encephalopathy and he also has hepatitis C.   He also has anemia and no further bleeding noted.   Past Medical History Past Medical History:  Diagnosis Date   Ascites    Chronic kidney disease    Cirrhosis (HCC)    Complication of anesthesia    Post operative vomiting for "2 weeks" per pt after cholecystectomy   Coronary artery disease involving native coronary artery 05/29/2015   05/08/12 had PCI and Xience stent to RCA for Troponin normal unstable angina with residual 40-50% proximal and 30-40 % mid LAD stenosis, EF normal 55-60%   Depression    Essential hypertension 05/29/2015   GERD (gastroesophageal reflux disease)    Hepatitis-C 03/31/2021   undetectable virus on 03/31/2021.     Irritable bowel syndrome    Mixed hyperlipidemia 05/29/2015   Palpitations    PONV (postoperative nausea and vomiting)    Tinnitus    Type 2 diabetes mellitus (HCC) 05/29/2015     Allergies No Known Allergies   Review of  Systems Review of Systems  Constitutional: Negative.   Respiratory: Negative.    Cardiovascular: Negative.   Gastrointestinal: Negative.        Objective:    Vitals BP 118/70 (BP Location: Left Arm, Patient Position: Sitting, Cuff Size: Normal)   Pulse 62   Temp 98 F (36.7 C)   Resp 18   Ht 5\' 9"  (1.753 m)   Wt 200 lb (90.7 kg)   SpO2 96%   BMI 29.53 kg/m    Physical Examination Physical Exam Constitutional:      Appearance: Normal appearance.  HENT:     Head: Normocephalic and atraumatic.  Cardiovascular:     Rate and Rhythm: Normal rate and regular rhythm.     Heart sounds: Normal heart sounds.  Pulmonary:     Effort: Pulmonary effort is normal.     Breath sounds: Normal breath sounds.  Abdominal:     General: Bowel sounds are normal.     Palpations: Abdomen is soft.  Neurological:     General: No focal deficit present.     Mental Status: He is alert.        Assessment & Plan:   Type 2 diabetes mellitus with diabetic neuropathy, unspecified (HCC) He will increase lantus 3 units every 3rd day if fasting sugar is above 200 mg/dl. I will repeat HbA1c on next visit, I  will stop glimepride and will add short acting insulin to take during lunch.    Chronic kidney disease He will continue to follow with nephrologist    Return in about 3 months (around 08/25/2023).   Eloisa Northern, MD

## 2023-05-25 NOTE — Assessment & Plan Note (Signed)
He will continue to follow with nephrologist

## 2023-05-28 DIAGNOSIS — N1832 Chronic kidney disease, stage 3b: Secondary | ICD-10-CM | POA: Diagnosis not present

## 2023-05-28 DIAGNOSIS — K7460 Unspecified cirrhosis of liver: Secondary | ICD-10-CM | POA: Diagnosis not present

## 2023-05-28 DIAGNOSIS — E1122 Type 2 diabetes mellitus with diabetic chronic kidney disease: Secondary | ICD-10-CM | POA: Diagnosis not present

## 2023-05-28 DIAGNOSIS — I129 Hypertensive chronic kidney disease with stage 1 through stage 4 chronic kidney disease, or unspecified chronic kidney disease: Secondary | ICD-10-CM | POA: Diagnosis not present

## 2023-05-28 DIAGNOSIS — D631 Anemia in chronic kidney disease: Secondary | ICD-10-CM | POA: Diagnosis not present

## 2023-05-28 DIAGNOSIS — R609 Edema, unspecified: Secondary | ICD-10-CM | POA: Diagnosis not present

## 2023-05-28 DIAGNOSIS — N2581 Secondary hyperparathyroidism of renal origin: Secondary | ICD-10-CM | POA: Diagnosis not present

## 2023-06-01 ENCOUNTER — Ambulatory Visit: Payer: Medicare Other | Admitting: Internal Medicine

## 2023-06-01 ENCOUNTER — Encounter: Payer: Self-pay | Admitting: Internal Medicine

## 2023-06-01 VITALS — BP 110/60 | HR 65 | Temp 98.0°F | Resp 18 | Ht 69.0 in | Wt 201.5 lb

## 2023-06-01 DIAGNOSIS — Z794 Long term (current) use of insulin: Secondary | ICD-10-CM

## 2023-06-01 DIAGNOSIS — E1165 Type 2 diabetes mellitus with hyperglycemia: Secondary | ICD-10-CM

## 2023-06-01 DIAGNOSIS — R3589 Other polyuria: Secondary | ICD-10-CM

## 2023-06-01 HISTORY — DX: Other polyuria: R35.89

## 2023-06-01 HISTORY — DX: Type 2 diabetes mellitus with hyperglycemia: E11.65

## 2023-06-01 LAB — POCT URINALYSIS DIPSTICK
Bilirubin, UA: NEGATIVE
Blood, UA: NEGATIVE
Glucose, UA: POSITIVE — AB
Ketones, UA: NEGATIVE
Leukocytes, UA: NEGATIVE
Nitrite, UA: NEGATIVE
Protein, UA: NEGATIVE
Spec Grav, UA: 1.015 (ref 1.010–1.025)
Urobilinogen, UA: 0.2 E.U./dL
pH, UA: 6 (ref 5.0–8.0)

## 2023-06-01 LAB — GLUCOSE, POCT (MANUAL RESULT ENTRY): POC Glucose: 560 mg/dl — AB (ref 70–99)

## 2023-06-01 MED ORDER — INSULIN LISPRO (1 UNIT DIAL) 100 UNIT/ML (KWIKPEN): PEN_INJECTOR | SUBCUTANEOUS | 11 refills | Status: DC

## 2023-06-01 MED ORDER — DEXCOM G7 SENSOR MISC: 2 refills | Status: AC

## 2023-06-01 NOTE — Assessment & Plan Note (Signed)
I will do UA to make he does not have UTI.

## 2023-06-01 NOTE — Progress Notes (Signed)
   Office Visit  Subjective   Patient ID: Matthew Ferguson   DOB: Jul 08, 1946   Age: 77 y.o.   MRN: [id number]   Chief Complaint Chief Complaint  Patient presents with   Diabetes    Type 2 diabetes Mellitus Office visit      History of Present Illness 77 years old male who is here because his sugar been 600 mg/dl. He was here last week, he went to [facility] doctor next day and they did labs and his sugar was 600 mg /dl. He called and and I have suggested to increase lantus 3 units every 3rd day for fasting sugar greater than 150 mg/dl. Per wife he has cut down sweat stuff, he stay thirsty, wake up 3-4 times at night. He check his sugar once day. He also started taking lactulose twice a day. His last HbA1c was 8.7% 5 months ago.   He says he stay tired all the times. He has CKD V and he follows with nephrologist. He was told that his kidney function is better.    Past Medical History Past Medical History:  Diagnosis Date   Ascites    Chronic kidney disease    Cirrhosis (HCC)    Complication of anesthesia    Post operative vomiting for "2 weeks" per pt after cholecystectomy   Coronary artery disease involving native coronary artery 05/29/2015   05/08/12 had PCI and Xience stent to RCA for Troponin normal unstable angina with residual 40-50% proximal and 30-40 % mid LAD stenosis, EF normal 55-60%   Depression    Essential hypertension 05/29/2015   GERD (gastroesophageal reflux disease)    Hepatitis-C 03/31/2021   undetectable virus on 03/31/2021.     Irritable bowel syndrome    Mixed hyperlipidemia 05/29/2015   Palpitations    PONV (postoperative nausea and vomiting)    Tinnitus    Type 2 diabetes mellitus (HCC) 05/29/2015     Allergies No Known Allergies   Review of Systems Review of Systems  Constitutional:  Positive for malaise/fatigue.  Respiratory: Negative.    Cardiovascular: Negative.   Genitourinary:  Positive for frequency.       Objective:    Vitals BP 110/60 (BP  Location: Left Arm, Patient Position: Sitting, Cuff Size: Normal)   Pulse 65   Temp 98 F (36.7 C)   Resp 18   Ht 5\' 9"  (1.753 m)   Wt 201 lb 8 oz (91.4 kg)   SpO2 99%   BMI 29.76 kg/m    Physical Examination Physical Exam Constitutional:      Appearance: Normal appearance.  Cardiovascular:     Rate and Rhythm: Normal rate and regular rhythm.     Heart sounds: Normal heart sounds.  Pulmonary:     Effort: Pulmonary effort is normal.     Breath sounds: Normal breath sounds.  Abdominal:     General: Bowel sounds are normal.     Palpations: Abdomen is soft.  Neurological:     Mental Status: He is alert.     Comments: weak        Assessment & Plan:   Type 2 diabetes mellitus with hyperglycemia (HCC) He will keep increasing lantus 3 units every 3rd day for fasting sugar greater than 150 mg/dl.  I will also add insulin Lispro 5 units with lunch.   Polyuria I will do UA to make he does not have UTI.    No follow-ups on file.   Eloisa Northern, MD

## 2023-06-01 NOTE — Addendum Note (Signed)
Addended by: Anuar Walgren on: 06/01/2023 03:20 PM   Modules accepted: Orders

## 2023-06-01 NOTE — Assessment & Plan Note (Signed)
He will keep increasing lantus 3 units every 3rd day for fasting sugar greater than 150 mg/dl.  I will also add insulin Lispro 5 units with lunch.

## 2023-06-15 NOTE — Progress Notes (Unsigned)
Cardiology Office Note:    Date:  06/16/2023   ID:  Matthew Ferguson, DOB April 28, 1946, MRN [id number]  PCP:  Matthew Northern, MD  Cardiologist:  Matthew Herrlich, MD    Referring MD: Matthew Northern, MD    ASSESSMENT:    1. Coronary artery disease involving native coronary artery of native heart, unspecified whether angina present   2. Sinus bradycardia   3. Cirrhosis of liver with ascites, unspecified hepatic cirrhosis type (HCC)   4. Type 2 diabetes mellitus without complication, without long-term current use of insulin (HCC)    PLAN:    In order of problems listed above:  Matthew Ferguson is improved with CAD continue his current beta-blocker nitroglycerin as needed and I would not consider him a candidate for elective cardiac revascularization with the severity of his liver disease Stable sinus rhythm he uses a smart watch and has had no heart rate alerts and checks heart rate and blood pressure daily and is not having rates less than 50 bpm Stable his current primary problem is encephalopathy and subsequent elevated blood sugar with his lactulose therapy Followed by [facility] for CAD stable   Next appointment: 6 months   Medication Adjustments/Labs and Tests Ordered: Current medicines are reviewed at length with the patient today.  Concerns regarding medicines are outlined above.  Orders Placed This Encounter  Procedures   EKG 12-Lead   No orders of the defined types were placed in this encounter.    History of Present Illness:    Matthew Ferguson is a 77 y.o. male with a hx of CAD with PCI of the right coronary artery 2013 type 2 diabetes hypertension hyperlipidemia hepatitis C infection with cirrhosis and portal venous hypertension and ascites and previous severe anemia requiring hospitalization and admission to the hospital with transfusion associated with GI bleed last seen 08/22/2022. His beta-blocker been discontinued with hypotension developed angina and put back on a low-dose  of a selective beta-blocker tolerated controlling his angina.  Compliance with diet, lifestyle and medications: Yes  He is seen in the office along with his wife Overall doing better has not required paracentesis peripheral edema is controlled with loop diuretic and his wife tells me labs done at [facility] show improved kidney function no longer taking spironolactone He tolerates his beta-blocker without hypotension He has occasional episodes of angina relieved with nitroglycerin stable pattern No edema orthopnea palpitation or syncope His primary problem is uncontrolled diabetes blood sugar averages 380 that is attributed to his lactulose did a quick literature search and there are variable effects ranging from little or none significant in patients with type 2 diabetes and lactulose therapy Past Medical History:  Diagnosis Date   Ascites    Chronic kidney disease    Cirrhosis (HCC)    Complication of anesthesia    Post operative vomiting for "2 weeks" per pt after cholecystectomy   Coronary artery disease involving native coronary artery 05/29/2015   05/08/12 had PCI and Xience stent to RCA for Troponin normal unstable angina with residual 40-50% proximal and 30-40 % mid LAD stenosis, EF normal 55-60%   Depression    Essential hypertension 05/29/2015   GERD (gastroesophageal reflux disease)    Hepatitis-C 03/31/2021   undetectable virus on 03/31/2021.     Irritable bowel syndrome    Mixed hyperlipidemia 05/29/2015   Palpitations    PONV (postoperative nausea and vomiting)    Tinnitus    Type 2 diabetes mellitus (HCC) 05/29/2015    Current Medications:  Current Meds  Medication Sig   acebutolol (SECTRAL) 200 MG capsule TAKE 1 CAPSULE EVERY EVENING (NEED APPOINTMENT FOR FUTURE REFILLS)   Continuous Glucose Sensor (DEXCOM G7 SENSOR) MISC Apply every 10 days.   fluticasone (FLONASE) 50 MCG/ACT nasal spray USE 1 TO 2 SPRAYS IN EACH NOSTRIL ONCE DAILY AS NEEDED    furosemide (LASIX) 20 MG tablet Take 1 tablet (20 mg total) by mouth every other day.   insulin glargine (LANTUS SOLOSTAR) 100 UNIT/ML Solostar Pen Inject 7 Units into the skin daily.   insulin lispro (HUMALOG KWIKPEN) 100 UNIT/ML KwikPen As per sliding scle that was given to patient   Insulin Pen Needle 31G X 8 MM MISC Use with Lantus pen as directed.   isosorbide mononitrate (IMDUR) 30 MG 24 hr tablet Take 1 tablet (30 mg total) by mouth daily.   lactulose (CHRONULAC) 10 GM/15ML solution Take 15 mLs (10 g total) by mouth 3 (three) times daily.   Microlet Lancets MISC USE AS DIRECTED   neomycin-polymyxin-hydrocortisone (CORTISPORIN) OTIC solution Apply 1 drop to both big toes at painful corners after bath/shower once a day until pain is better   nitroGLYCERIN (NITROSTAT) 0.4 MG SL tablet Place 1 tablet (0.4 mg total) under the tongue every 5 (five) minutes as needed for chest pain.   pantoprazole (PROTONIX) 40 MG tablet Take 1 tablet (40 mg total) by mouth in the morning.   rifaximin (XIFAXAN) 550 MG TABS tablet Take 1 tablet (550 mg total) by mouth 2 (two) times daily.   sertraline (ZOLOFT) 25 MG tablet Take 1 tablet (25 mg total) by mouth daily.      EKGs/Labs/Other Studies Reviewed:    The following studies were reviewed today:  Cardiac Studies & Procedures     STRESS TESTS  MYOCARDIAL PERFUSION IMAGING 02/09/2020  Narrative  The left ventricular ejection fraction is hyperdynamic (>65%).  Nuclear stress EF: 68%.  There was no ST segment deviation noted during stress.  This is a low risk study.  No evidence of ischemia or MI.  Normal EF.   ECHOCARDIOGRAM  ECHOCARDIOGRAM COMPLETE 03/10/2022  Narrative ECHOCARDIOGRAM REPORT    Patient Name:   Matthew Ferguson Date of Exam: 03/10/2022 Medical Rec #:  [id number]   Height:       69.0 in Accession #:    [id number]  Weight:       186.0 lb Date of Birth:  October 05, 1946  BSA:          2.003 m Patient Age:    4 years    BP:            137/81 mmHg Patient Gender: M           HR:           60 bpm. Exam Location:  Outpatient  Procedure: 2D Echo, Cardiac Doppler and Color Doppler  Indications:    Pre-Op Evaluation; Pre Tips procedure  History:        Patient has no prior history of Echocardiogram examinations. CAD; Risk Factors:Hypertension, Dyslipidemia and Diabetes.  Sonographer:    Thurman Coyer RDCS Referring Phys: [id number] [facility] S[facility]TLE  IMPRESSIONS   1. Left ventricular ejection fraction, by estimation, is 65 to 70%. The left ventricle has normal function. The left ventricle has no regional wall motion abnormalities. There is mild concentric left ventricular hypertrophy. Left ventricular diastolic parameters are consistent with Grade II diastolic dysfunction (pseudonormalization). 2. Right ventricular systolic function is normal. The right ventricular size is normal. Tricuspid regurgitation  signal is inadequate for assessing PA pressure. 3. Left atrial size was mildly dilated. 4. The mitral valve is normal in structure. Trivial mitral valve regurgitation. No evidence of mitral stenosis. 5. The aortic valve is tricuspid. Aortic valve regurgitation is not visualized. Aortic valve sclerosis/calcification is present, without any evidence of aortic stenosis. 6. Aortic dilatation noted. There is mild dilatation of the aortic root, measuring 38 mm.  FINDINGS Left Ventricle: Left ventricular ejection fraction, by estimation, is 65 to 70%. The left ventricle has normal function. The left ventricle has no regional wall motion abnormalities. The left ventricular internal cavity size was normal in size. There is mild concentric left ventricular hypertrophy. Left ventricular diastolic parameters are consistent with Grade II diastolic dysfunction (pseudonormalization). Normal left ventricular filling pressure.  Right Ventricle: The right ventricular size is normal. No increase in right ventricular wall thickness. Right  ventricular systolic function is normal. Tricuspid regurgitation signal is inadequate for assessing PA pressure.  Left Atrium: Left atrial size was mildly dilated.  Right Atrium: Right atrial size was normal in size.  Pericardium: There is no evidence of pericardial effusion.  Mitral Valve: The mitral valve is normal in structure. Trivial mitral valve regurgitation. No evidence of mitral valve stenosis.  Tricuspid Valve: The tricuspid valve is normal in structure. Tricuspid valve regurgitation is trivial. No evidence of tricuspid stenosis.  Aortic Valve: The aortic valve is tricuspid. Aortic valve regurgitation is not visualized. Aortic valve sclerosis/calcification is present, without any evidence of aortic stenosis.  Pulmonic Valve: The pulmonic valve was normal in structure. Pulmonic valve regurgitation is not visualized. No evidence of pulmonic stenosis.  Aorta: Aortic dilatation noted. There is mild dilatation of the aortic root, measuring 38 mm.  IAS/Shunts: No atrial level shunt detected by color flow Doppler.   LEFT VENTRICLE PLAX 2D LVIDd:         4.10 cm      Diastology LVIDs:         2.50 cm      LV e' medial:    6.57 cm/s LV PW:         1.20 cm      LV E/e' medial:  10.7 LV IVS:        1.20 cm      LV e' lateral:   8.06 cm/s LVOT diam:     2.50 cm      LV E/e' lateral: 8.7 LV SV:         111 LV SV Index:   56 LVOT Area:     4.91 cm  LV Volumes (MOD) LV vol d, MOD A2C: 144.0 ml LV vol d, MOD A4C: 84.8 ml LV vol s, MOD A2C: 51.6 ml LV vol s, MOD A4C: 32.0 ml LV SV MOD A2C:     92.4 ml LV SV MOD A4C:     84.8 ml LV SV MOD BP:      77.7 ml  RIGHT VENTRICLE RV Basal diam:  3.00 cm RV S prime:     11.22 cm/s TAPSE (M-mode): 2.0 cm  LEFT ATRIUM             Index        RIGHT ATRIUM           Index LA diam:        3.60 cm 1.80 cm/m   RA Area:     15.40 cm LA Vol (A2C):   66.5 ml 33.20 ml/m  RA Volume:   32.70 ml  16.32 ml/m LA Vol (A4C):   72.0 ml 35.94  ml/m LA Biplane Vol: 71.3 ml 35.59 ml/m AORTIC VALVE LVOT Vmax:   107.00 cm/s LVOT Vmean:  65.500 cm/s LVOT VTI:    0.227 m  AORTA Ao Root diam: 3.80 cm Ao Asc diam:  3.50 cm  MITRAL VALVE MV Area (PHT): 3.72 cm    SHUNTS MV Decel Time: 204 msec    Systemic VTI:  0.23 m MV E velocity: 70.30 cm/s  Systemic Diam: 2.50 cm MV A velocity: 63.00 cm/s MV E/A ratio:  1.12  Armanda Magic MD Electronically signed by Armanda Magic MD Signature Date/Time: 03/10/2022/2:35:40 PM    Final    MONITORS  LONG TERM MONITOR-LIVE TELEMETRY (3-14 DAYS) 01/21/2022  Narrative Patch Wear Time:  13 days and 18 hours (2023-03-01T15:52:30-0500 to 2023-03-15T11:48:22-0400)  Patient had a min HR of 44 bpm, max HR of 203 bpm, and avg HR of 67 bpm. Predominant underlying rhythm was Sinus Rhythm.  Isolated SVEs were occasional (1.3%, 17727), SVE Couplets were rare (<1.0%, 41), and SVE Triplets were rare (<1.0%, 5).  Isolated VEs were rare (<1.0%, 591), VE Couplets were rare (<1.0%, 9), and VE Triplets were rare (<1.0%, 1). Ventricular Bigeminy and Trigeminy were present. 1 run of Ventricular Tachycardia occurred lasting 6 beats with a max rate of 203 bpm (avg 120 bpm).  There were no episodes of atrial fibrillation or flutter.  There were 10 triggered and 8 diary events. 4 episodes were associated with frequent PVCs and bigeminy 2 episodes occurred frequent APCs The episodes of SVT were asymptomatic          EKG Interpretation Date/Time:  Tuesday June 16 2023 15:28:36 EDT Ventricular Rate:  63 PR Interval:  182 QRS Duration:  76 QT Interval:  410 QTC Calculation: 419 R Axis:   -22  Text Interpretation: Normal sinus rhythm Normal ECG When compared with ECG of 17-Mar-2022 07:38, No significant change was found Confirmed by Matthew Ferguson ([id number]) on 06/16/2023 3:33:52 PM   Recent Labs: 10/20/2022: Creatinine, Ser 2.36  Recent Lipid Panel No results found for: "CHOL", "TRIG", "HDL", "CHOLHDL",  "VLDL", "LDLCALC", "LDLDIRECT"  Physical Exam:    VS:  BP 122/82 (BP Location: Left Arm, Patient Position: Sitting, Cuff Size: Normal)   Pulse 63   Ht 5\' 9"  (1.753 m)   Wt 190 lb (86.2 kg)   SpO2 97%   BMI 28.06 kg/m     Wt Readings from Last 3 Encounters:  06/16/23 190 lb (86.2 kg)  06/01/23 201 lb 8 oz (91.4 kg)  05/25/23 200 lb (90.7 kg)     GEN: He no longer looks chronically ill he is quite alert today well nourished, well developed in no acute distress HEENT: Normal NECK: No JVD; No carotid bruits LYMPHATICS: No lymphadenopathy CARDIAC: RRR, no murmurs, rubs, gallops RESPIRATORY:  Clear to auscultation without rales, wheezing or rhonchi  ABDOMEN: Soft, non-tender, non-distended MUSCULOSKELETAL:  No edema; No deformity  SKIN: Warm and dry NEUROLOGIC:  Alert and oriented x 3 PSYCHIATRIC:  Normal affect    Signed, Matthew Herrlich, MD  06/16/2023 4:11 PM    [facility]

## 2023-06-16 ENCOUNTER — Ambulatory Visit: Payer: Medicare Other | Attending: Cardiology | Admitting: Cardiology

## 2023-06-16 ENCOUNTER — Other Ambulatory Visit: Payer: Self-pay

## 2023-06-16 ENCOUNTER — Ambulatory Visit (INDEPENDENT_AMBULATORY_CARE_PROVIDER_SITE_OTHER): Payer: Medicare Other | Admitting: Podiatry

## 2023-06-16 ENCOUNTER — Encounter: Payer: Self-pay | Admitting: Cardiology

## 2023-06-16 VITALS — BP 122/82 | HR 63 | Ht 69.0 in | Wt 190.0 lb

## 2023-06-16 DIAGNOSIS — M79674 Pain in right toe(s): Secondary | ICD-10-CM | POA: Diagnosis not present

## 2023-06-16 DIAGNOSIS — R188 Other ascites: Secondary | ICD-10-CM | POA: Diagnosis not present

## 2023-06-16 DIAGNOSIS — R001 Bradycardia, unspecified: Secondary | ICD-10-CM | POA: Insufficient documentation

## 2023-06-16 DIAGNOSIS — I2510 Atherosclerotic heart disease of native coronary artery without angina pectoris: Secondary | ICD-10-CM | POA: Insufficient documentation

## 2023-06-16 DIAGNOSIS — Z794 Long term (current) use of insulin: Secondary | ICD-10-CM

## 2023-06-16 DIAGNOSIS — B351 Tinea unguium: Secondary | ICD-10-CM

## 2023-06-16 DIAGNOSIS — K7460 Unspecified cirrhosis of liver: Secondary | ICD-10-CM | POA: Diagnosis not present

## 2023-06-16 DIAGNOSIS — M79675 Pain in left toe(s): Secondary | ICD-10-CM | POA: Diagnosis not present

## 2023-06-16 DIAGNOSIS — E1140 Type 2 diabetes mellitus with diabetic neuropathy, unspecified: Secondary | ICD-10-CM

## 2023-06-16 DIAGNOSIS — E119 Type 2 diabetes mellitus without complications: Secondary | ICD-10-CM | POA: Insufficient documentation

## 2023-06-16 MED ORDER — RIFAXIMIN 550 MG PO TABS: 550.0000 mg | ORAL_TABLET | Freq: Two times a day (BID) | ORAL | 2 refills | Status: DC

## 2023-06-16 NOTE — Progress Notes (Signed)
  Subjective:  Patient ID: Matthew Ferguson, male    DOB: 09-23-1946,  MRN: [id number]  Chief Complaint  Patient presents with   Nail Problem    Diabetic Foot Care-nail trim     77 y.o. male presents with the above complaint. History confirmed with patient. Patient presenting with pain related to dystrophic thickened elongated nails. Patient is unable to trim own nails related to nail dystrophy and/or mobility issues. Patient does have a history of T2DM.   Objective:  Physical Exam: warm, good capillary refill nail exam onychomycosis of the toenails, onycholysis, and dystrophic nails DP pulses palpable, PT pulses palpable, and protective sensation absent Left Foot:  Pain with palpation of nails due to elongation and dystrophic growth.  Right Foot: Pain with palpation of nails due to elongation and dystrophic growth.   Assessment:   1. Pain due to onychomycosis of toenails of both feet   2. Type 2 diabetes mellitus with diabetic neuropathy, without long-term current use of insulin (HCC)        Plan:  Patient was evaluated and treated and all questions answered.   #Onychomycosis with pain  -Nails palliatively debrided as below. -Educated on self-care  Procedure: Nail Debridement Rationale: Pain Type of Debridement: manual, sharp debridement. Instrumentation: Nail nipper, rotary burr. Number of Nails: 10  No follow-ups on file.         Corinna Gab, DPM [facility]

## 2023-06-16 NOTE — Patient Instructions (Signed)

## 2023-07-03 ENCOUNTER — Other Ambulatory Visit: Payer: Self-pay | Admitting: Internal Medicine

## 2023-07-17 ENCOUNTER — Other Ambulatory Visit: Payer: Self-pay | Admitting: Interventional Radiology

## 2023-07-17 DIAGNOSIS — K7469 Other cirrhosis of liver: Secondary | ICD-10-CM

## 2023-07-17 DIAGNOSIS — K766 Portal hypertension: Secondary | ICD-10-CM

## 2023-07-18 DIAGNOSIS — Z23 Encounter for immunization: Secondary | ICD-10-CM | POA: Diagnosis not present

## 2023-07-22 ENCOUNTER — Other Ambulatory Visit: Payer: Self-pay | Admitting: Internal Medicine

## 2023-07-30 ENCOUNTER — Other Ambulatory Visit: Payer: Self-pay | Admitting: Internal Medicine

## 2023-07-30 ENCOUNTER — Other Ambulatory Visit: Payer: Self-pay

## 2023-07-30 MED ORDER — LACTULOSE 10 GM/15ML PO SOLN: 10.0000 g | Freq: Three times a day (TID) | ORAL | 1 refills | Status: DC

## 2023-07-31 DIAGNOSIS — Z23 Encounter for immunization: Secondary | ICD-10-CM | POA: Diagnosis not present

## 2023-08-05 ENCOUNTER — Telehealth (HOSPITAL_COMMUNITY): Payer: Self-pay | Admitting: Student

## 2023-08-05 ENCOUNTER — Ambulatory Visit
Admission: RE | Admit: 2023-08-05 | Discharge: 2023-08-05 | Disposition: A | Discharge status: Home / Self Care | Payer: Medicare Other | Source: Ambulatory Visit | Attending: Interventional Radiology

## 2023-08-05 DIAGNOSIS — K766 Portal hypertension: Secondary | ICD-10-CM

## 2023-08-05 DIAGNOSIS — K7469 Other cirrhosis of liver: Secondary | ICD-10-CM | POA: Diagnosis not present

## 2023-08-05 HISTORY — PX: IR RADIOLOGIST EVAL & MGMT: IMG5224

## 2023-08-05 MED ORDER — RIFAXIMIN 550 MG PO TABS: 550.0000 mg | ORAL_TABLET | Freq: Two times a day (BID) | ORAL | 3 refills | Status: DC

## 2023-08-05 NOTE — Progress Notes (Signed)
Referring Physician(s): Amada Jupiter, MD   Reason for follow up:  9 month status post TIPS creation   History of present illness: 77 y.o. male with history of cryptogenic cirrhosis (Child Pugh B, MELD 16) with portal hypertension and recurrent ascites in the setting of decreased diuresis limited by renal function.  He is status post TIPS creation on 03/17/22 which was uncomplicated and achieved a portosystemic mean pressure gradient reduction from 15 to 7 mmHg without post-deployment balloon dilation.    He presents today via telephone virtual clinic visit.     Describes some intermittent chest pain centrally and to the back of his abdomen.  He takes nitrates and it typically resolves - he's discussed this with his Cardiologist.  He describes some poor memory.  He continues to take lactulose, typically 2-3 x per day, with 1-3 bowel movements per day.  He describes some urinary incontinence.  He is currently taking furosemide 40 mg daily, spironolactone 25 mg daily.  This is down from his prior dosages.  Improved leg swelling, some residual.  No recurrent ascites or requirement for additional paracentesis.  Steady weight 190 lbs.     Past Medical History:  Diagnosis Date   Ascites    Chronic kidney disease    Cirrhosis (HCC)    Complication of anesthesia    Post operative vomiting for "2 weeks" per pt after cholecystectomy   Coronary artery disease involving native coronary artery 05/29/2015   05/08/12 had PCI and Xience stent to RCA for Troponin normal unstable angina with residual 40-50% proximal and 30-40 % mid LAD stenosis, EF normal 55-60%   Depression    Essential hypertension 05/29/2015   GERD (gastroesophageal reflux disease)    Hepatitis-C 03/31/2021   undetectable virus on 03/31/2021.     Irritable bowel syndrome    Mixed hyperlipidemia 05/29/2015   Palpitations    PONV (postoperative nausea and vomiting)    Tinnitus    Type 2 diabetes mellitus (HCC) 05/29/2015     Past Surgical History:  Procedure Laterality Date   BACK SURGERY     CARDIAC CATHETERIZATION     CHOLECYSTECTOMY     COLONOSCOPY WITH PROPOFOL N/A 04/02/2021   Procedure: COLONOSCOPY WITH PROPOFOL;  Surgeon: Hilarie Fredrickson, MD;  Location: [facility] ENDOSCOPY;  Service: Endoscopy;  Laterality: N/A;   CORONARY ANGIOPLASTY WITH STENT PLACEMENT     ESOPHAGOGASTRODUODENOSCOPY (EGD) WITH PROPOFOL N/A 04/01/2021   Procedure: ESOPHAGOGASTRODUODENOSCOPY (EGD) WITH PROPOFOL;  Surgeon: Sherrilyn Rist, MD;  Location: [facility] ENDOSCOPY;  Service: Gastroenterology;  Laterality: N/A;   INGUINAL HERNIA REPAIR Bilateral 01/2021   IR INTRAVASCULAR ULTRASOUND NON CORONARY  03/17/2022   IR PARACENTESIS  10/17/2021   IR PARACENTESIS  01/24/2022   IR PARACENTESIS  02/19/2022   IR PARACENTESIS  03/05/2022   IR PARACENTESIS  03/17/2022   IR PARACENTESIS  05/05/2022   IR RADIOLOGIST EVAL & MGMT  02/24/2022   IR RADIOLOGIST EVAL & MGMT  04/17/2022   IR RADIOLOGIST EVAL & MGMT  05/14/2022   IR RADIOLOGIST EVAL & MGMT  07/23/2022   IR RADIOLOGIST EVAL & MGMT  01/12/2023   IR TIPS  03/17/2022   IR US GUIDE VASC ACCESS RIGHT  03/17/2022   KNEE SURGERY     RADIOLOGY WITH ANESTHESIA N/A 03/17/2022   Procedure: TIPS;  Surgeon: Bennie Dallas, MD;  Location: [facility] OR;  Service: Radiology;  Laterality: N/A;    Allergies: Patient has no known allergies.  Medications: Prior to  Admission medications   Medication Sig Start Date End Date Taking? Authorizing Provider  acebutolol (SECTRAL) 200 MG capsule TAKE 1 CAPSULE EVERY EVENING (NEED APPOINTMENT FOR FUTURE REFILLS) 04/16/23   Baldo Daub, MD  Continuous Glucose Sensor (DEXCOM G7 SENSOR) MISC Apply every 10 days. 06/01/23   Eloisa Northern, MD  fluticasone (FLONASE) 50 MCG/ACT nasal spray USE 1 TO 2 SPRAYS IN EACH NOSTRIL ONCE DAILY AS NEEDED 02/04/23   Eloisa Northern, MD  furosemide (LASIX) 20 MG tablet Take 1 tablet (20 mg total) by mouth every other day. 03/04/23   Eloisa Northern, MD  insulin  lispro (HUMALOG KWIKPEN) 100 UNIT/ML KwikPen As per sliding scle that was given to patient 06/01/23   Eloisa Northern, MD  Insulin Pen Needle (B-D ULTRAFINE III SHORT PEN) 31G X 8 MM MISC USE WITH LANTUS PEN AS DIRECTED 07/03/23   Eloisa Northern, MD  isosorbide mononitrate (IMDUR) 30 MG 24 hr tablet Take 1 tablet (30 mg total) by mouth daily. 05/05/23   Eloisa Northern, MD  lactulose (CHRONULAC) 10 GM/15ML solution Take 15 mLs (10 g total) by mouth 3 (three) times daily. 07/30/23   Eloisa Northern, MD  LANTUS SOLOSTAR 100 UNIT/ML Solostar Pen INJECT 7 UNITS UNDER THE SKIN DAILY 07/22/23   Eloisa Northern, MD  Microlet Lancets MISC USE AS DIRECTED 02/02/23   Eloisa Northern, MD  neomycin-polymyxin-hydrocortisone (CORTISPORIN) OTIC solution Apply 1 drop to both big toes at painful corners after bath/shower once a day until pain is better 10/24/22   Eloisa Northern, MD  nitroGLYCERIN (NITROSTAT) 0.4 MG SL tablet Place 1 tablet (0.4 mg total) under the tongue every 5 (five) minutes as needed for chest pain. 08/26/22   Baldo Daub, MD  pantoprazole (PROTONIX) 40 MG tablet Take 1 tablet (40 mg total) by mouth in the morning. 11/14/22   Eloisa Northern, MD  rifaximin (XIFAXAN) 550 MG TABS tablet Take 1 tablet (550 mg total) by mouth 2 (two) times daily. 06/16/23   Eloisa Northern, MD  sertraline (ZOLOFT) 25 MG tablet Take 1 tablet (25 mg total) by mouth daily. 05/05/23   Eloisa Northern, MD     Family History  Problem Relation Age of Onset   Other Mother        died with old age   Stroke Father    Diabetes Father    Heart disease Father    Congenital heart disease Daughter    Colon cancer Neg Hx    Esophageal cancer Neg Hx    Pancreatic cancer Neg Hx    Stomach cancer Neg Hx     Social History   Socioeconomic History   Marital status: Married    Spouse name: Garment/textile technologist   Number of children: 2   Years of education: Not on file   Highest education level: Not on file  Occupational History   Occupation: retired  Tobacco Use   Smoking status: Never     Passive exposure: Past   Smokeless tobacco: Never  Vaping Use   Vaping status: Never Used  Substance and Sexual Activity   Alcohol use: Never   Drug use: Never   Sexual activity: Not on file  Other Topics Concern   Not on file  Social History Narrative   Not on file   Social Determinants of Health   Financial Resource Strain: Not on file  Food Insecurity: Not on file  Transportation Needs: Not on file  Physical Activity: Not on file  Stress: Not on file  Social Connections:  Unknown (10/08/2022)   Received from [facility]   Social Network    Social Network: Not on file     Vital Signs: There were no vitals taken for this visit.  No physical examination was performed in lieu of virtual telephone clinic visit.   Imaging: TIPS creation 03/17/22:  8+2 Viatorr Portosystemic gradient of 15 mm Hg (absolute portal venous pressure 28 mm Hg) before shunt placement and 7 mm Hg (absolute portal venous pressure 19 mm Hg) after shunt placement.    TIPS Duplex 11/07/22  Patent TIPS, no ascites    Labs:  CBC: No results for input(s): "WBC", "HGB", "HCT", "PLT" in the last 8760 hours.  COAGS: Recent Labs    10/20/22 1039  INR 1.1*    BMP: Recent Labs    10/20/22 1039  CREATININE 2.36*    LIVER FUNCTION TESTS: Recent Labs    10/20/22 1039  BILITOT 0.9    Assessment and Plan: 77 y.o. male with history of cryptogenic cirrhosis (Child Pugh B, MELD 16 pre TIPS --> 13 current) with portal hypertension and recurrent ascites in the setting of decreased diuresis limited by renal function.  He is recovering well status post TIPS creation on 03/17/22 which was uncomplicated and achieved a portosystemic mean pressure gradient reduction from 15 to 7 mmHg without post-deployment balloon dilation. Ascites formation has completely stopped.  He is compliant with lactulose but is hoping he could transition to a different medication.  We will see if his insurance will  cover rifaximin.   -continue lactulose, titrating to 3-4 bowel movements per day for now until taking rifaximin (550 mg BID, PO) for at least 3 days -if there are financial issues with rifaximin, he will contact our office and we will help with financial assistance -agree with current diuretic dose -follow up in 6 months or sooner if needed   Marliss Coots, MD Pager: [phone] Clinic: [phone]    I spent a total of 25 Minutes in virtual telephone clinical consultation, greater than 50% of which was counseling/coordinating care for portal hypertension.

## 2023-08-05 NOTE — Telephone Encounter (Signed)
Patient s/p TIPS 03/17/22 with follow up tele-visit today with Dr. Elby Showers. Dr. Elby Showers had instructed patient to begin taking rifaximin and to discontinue lactulose after three days.   After further discussion with patient, he has a current prescription for Rifaximin from Dr. Nelson Chimes and he's been taking this drug since August. Patient instructed to discontinue lactulose today.   I spoke with the patient and his wife and discussed the signs of hepatic encephalopathy. Patient and his wife know to call me (cell phone number provided) if patient exhibits any signs of altered mentation. Patient and his wife know I will plan to call them next week for follow up.  Patient requested a Rifaximin refill and requested a 90 supply at a time. I e-prescribed a 90-day supply with 3 refills [facility]. Patient aware that due to insurance coverage he may only be able to obtain 30 days at a time.   Alwyn Ren, [place] [phone] 08/05/2023, 12:03 PM

## 2023-08-12 ENCOUNTER — Telehealth (HOSPITAL_COMMUNITY): Payer: Self-pay | Admitting: Student

## 2023-08-12 NOTE — Telephone Encounter (Signed)
Follow up phone call today to check in with Matthew Ferguson. We had him discontinue his lactulose a week ago and he is now only taking Rifaximin for HE prophylaxis. I spoke with Matthew Ferguson and his wife, Matthew Ferguson. Both of them report no changes in mental status. Matthew Ferguson feels the same and there are no concerns at this time.   I will call them next week for another check in but they have the number to reach me and will call me if anything changes between now and then.  Alwyn Ren, [place] [phone] 08/12/2023, 10:48 AM

## 2023-08-19 ENCOUNTER — Telehealth (HOSPITAL_COMMUNITY): Payer: Self-pay | Admitting: Student

## 2023-08-19 NOTE — Telephone Encounter (Signed)
Telephone follow up to assess for response to discontinuing lactulose several weeks ago. No answer, voice message left with request for return call.  Alwyn Ren, [place] [phone] 08/19/2023, 1:00 PM

## 2023-08-20 ENCOUNTER — Telehealth (HOSPITAL_COMMUNITY): Payer: Self-pay | Admitting: Student

## 2023-08-20 NOTE — Telephone Encounter (Signed)
Follow up phone call today to check in with Matthew Ferguson. We had him discontinue his lactulose several weeks ago and he is now only taking Rifaximin for HE prophylaxis. I spoke with Matthew Ferguson and his wife, Matthew Ferguson. Matthew Ferguson stated that she hasn't noticed any change in his mental status. Matthew Ferguson had no complaints other than frequent urination but Matthew Ferguson stated this is normal for him due to being on diuretics.  No current needs at this time - Matthew Ferguson knows she can call me with questions/concerns.  Matthew Ferguson, [place] [phone] 08/20/2023, 11:28 AM

## 2023-08-21 ENCOUNTER — Ambulatory Visit: Payer: Medicare Other | Admitting: Internal Medicine

## 2023-08-21 ENCOUNTER — Encounter: Payer: Self-pay | Admitting: Internal Medicine

## 2023-08-21 VITALS — BP 110/60 | HR 77 | Temp 97.9°F | Resp 18 | Ht 69.0 in | Wt 195.5 lb

## 2023-08-21 DIAGNOSIS — R2681 Unsteadiness on feet: Secondary | ICD-10-CM

## 2023-08-21 DIAGNOSIS — R188 Other ascites: Secondary | ICD-10-CM | POA: Diagnosis not present

## 2023-08-21 DIAGNOSIS — K7460 Unspecified cirrhosis of liver: Secondary | ICD-10-CM

## 2023-08-21 DIAGNOSIS — I10 Essential (primary) hypertension: Secondary | ICD-10-CM

## 2023-08-21 DIAGNOSIS — Z794 Long term (current) use of insulin: Secondary | ICD-10-CM | POA: Diagnosis not present

## 2023-08-21 DIAGNOSIS — E1140 Type 2 diabetes mellitus with diabetic neuropathy, unspecified: Secondary | ICD-10-CM

## 2023-08-21 DIAGNOSIS — N184 Chronic kidney disease, stage 4 (severe): Secondary | ICD-10-CM | POA: Diagnosis not present

## 2023-08-21 HISTORY — DX: Unsteadiness on feet: R26.81

## 2023-08-21 NOTE — Progress Notes (Signed)
Office Visit  Subjective   Patient ID: Matthew Ferguson   DOB: 07-Feb-1946   Age: 77 y.o.   MRN: [id number]   Chief Complaint Chief Complaint  Patient presents with   Follow up 3 month    Type 2 diabetes mellitus with hyperglycemia with long term use of insulin.     History of Present Illness 77 years old male who is here for follow up. He says he has not been using CGM for 2 weeks. ecause his sugar been 600 mg/dl. He was here last week, he went to Kidney doctor next day and they did labs and his sugar was 600 mg /dl. He called and and I have suggested to increase lantus 3 units every 3rd day for fasting sugar greater than 150 mg/dl. Per wife he has cut down sweat stuff, he stay thirsty, wake up 3-4 times at night. He check his sugar once day. He also started taking lactulose twice a day. His last HbA1c was 8.7% 5 months ago.    He says he stay tired all the times. He has CKD V and he follows with nephrologist. He was told that his kidney function is better.    Past Medical History Past Medical History:  Diagnosis Date   Ascites    Chronic kidney disease    Cirrhosis (HCC)    Complication of anesthesia    Post operative vomiting for "2 weeks" per pt after cholecystectomy   Coronary artery disease involving native coronary artery 05/29/2015   05/08/12 had PCI and Xience stent to RCA for Troponin normal unstable angina with residual 40-50% proximal and 30-40 % mid LAD stenosis, EF normal 55-60%   Depression    Essential hypertension 05/29/2015   GERD (gastroesophageal reflux disease)    Hepatitis-C 03/31/2021   undetectable virus on 03/31/2021.     Irritable bowel syndrome    Mixed hyperlipidemia 05/29/2015   Palpitations    PONV (postoperative nausea and vomiting)    Tinnitus    Type 2 diabetes mellitus (HCC) 05/29/2015     Allergies No Known Allergies   Review of Systems Review of Systems  Constitutional: Negative.   Respiratory: Negative.    Cardiovascular: Negative.    Gastrointestinal: Negative.   Neurological:  Positive for weakness.       Objective:    Vitals BP 110/60 (BP Location: Left Arm, Patient Position: Sitting, Cuff Size: Normal)   Pulse 77   Temp 97.9 F (36.6 C)   Resp 18   Ht 5\' 9"  (1.753 m)   Wt 195 lb 8 oz (88.7 kg)   SpO2 98%   BMI 28.87 kg/m    Physical Examination Physical Exam Constitutional:      Appearance: Normal appearance.  HENT:     Head: Normocephalic and atraumatic.  Cardiovascular:     Rate and Rhythm: Normal rate and regular rhythm.     Heart sounds: Normal heart sounds.  Pulmonary:     Effort: Pulmonary effort is normal.     Breath sounds: Normal breath sounds.  Abdominal:     General: Bowel sounds are normal.     Palpations: Abdomen is soft.  Neurological:     General: No focal deficit present.     Mental Status: He is alert and oriented to person, place, and time.        Assessment & Plan:   Essential hypertension Well controlled.  Cirrhosis (HCC) His ascites seems better, no leg swelling either.  Type 2 diabetes mellitus with  diabetic neuropathy, unspecified (HCC) His sugar has been controlled before but now his glucose readings have been high, I will do HbA1c and then reevaluate. He still has neuropathy pain.  Unsteady gait when walking I have discuss with him about PT to see if that helps. I will also do vitamin B level  Chronic kidney disease He will continue to follow with nephrologist.    Return in about 2 months (around 10/21/2023).   Eloisa Northern, MD

## 2023-08-24 LAB — LIPID PANEL
Chol/HDL Ratio: 4.3 {ratio} (ref 0.0–5.0)
Cholesterol, Total: 195 mg/dL (ref 100–199)
HDL: 45 mg/dL (ref >39)
LDL Chol Calc (NIH): 110 mg/dL — ABNORMAL HIGH (ref 0–99)
Triglycerides: 233 mg/dL — ABNORMAL HIGH (ref 0–149)
VLDL Cholesterol Cal: 40 mg/dL (ref 5–40)

## 2023-08-24 LAB — HEMOGLOBIN A1C
Est. average glucose Bld gHb Est-mCnc: 338 mg/dL
Hgb A1c MFr Bld: 13.4 % — ABNORMAL HIGH (ref 4.8–5.6)

## 2023-08-24 LAB — VITAMIN B12: Vitamin B-12: 566 pg/mL (ref 232–1245)

## 2023-08-25 DIAGNOSIS — I10 Essential (primary) hypertension: Secondary | ICD-10-CM | POA: Diagnosis not present

## 2023-08-25 DIAGNOSIS — N1832 Chronic kidney disease, stage 3b: Secondary | ICD-10-CM | POA: Diagnosis not present

## 2023-08-25 DIAGNOSIS — E119 Type 2 diabetes mellitus without complications: Secondary | ICD-10-CM | POA: Diagnosis not present

## 2023-08-26 NOTE — Progress Notes (Signed)
Patient called.  Left message for patient to call back.

## 2023-09-01 ENCOUNTER — Other Ambulatory Visit: Payer: Self-pay | Admitting: Cardiology

## 2023-09-01 ENCOUNTER — Other Ambulatory Visit: Payer: Self-pay

## 2023-09-02 NOTE — Assessment & Plan Note (Signed)
His ascites seems better, no leg swelling either.

## 2023-09-02 NOTE — Assessment & Plan Note (Signed)
His sugar has been controlled before but now his glucose readings have been high, I will do HbA1c and then reevaluate. He still has neuropathy pain.

## 2023-09-02 NOTE — Assessment & Plan Note (Addendum)
I have discuss with him about PT to see if that helps. I will also do vitamin B level

## 2023-09-02 NOTE — Assessment & Plan Note (Signed)
 He will continue to follow with nephrologist

## 2023-09-02 NOTE — Assessment & Plan Note (Signed)
Well controlled 

## 2023-09-03 DIAGNOSIS — E1122 Type 2 diabetes mellitus with diabetic chronic kidney disease: Secondary | ICD-10-CM | POA: Diagnosis not present

## 2023-09-03 DIAGNOSIS — K7460 Unspecified cirrhosis of liver: Secondary | ICD-10-CM | POA: Diagnosis not present

## 2023-09-03 DIAGNOSIS — D631 Anemia in chronic kidney disease: Secondary | ICD-10-CM | POA: Diagnosis not present

## 2023-09-03 DIAGNOSIS — N2581 Secondary hyperparathyroidism of renal origin: Secondary | ICD-10-CM | POA: Diagnosis not present

## 2023-09-03 DIAGNOSIS — I129 Hypertensive chronic kidney disease with stage 1 through stage 4 chronic kidney disease, or unspecified chronic kidney disease: Secondary | ICD-10-CM | POA: Diagnosis not present

## 2023-09-03 DIAGNOSIS — N1832 Chronic kidney disease, stage 3b: Secondary | ICD-10-CM | POA: Diagnosis not present

## 2023-09-03 DIAGNOSIS — E871 Hypo-osmolality and hyponatremia: Secondary | ICD-10-CM | POA: Diagnosis not present

## 2023-09-03 DIAGNOSIS — R350 Frequency of micturition: Secondary | ICD-10-CM | POA: Diagnosis not present

## 2023-09-04 ENCOUNTER — Ambulatory Visit: Payer: Medicare Other | Admitting: Internal Medicine

## 2023-09-04 ENCOUNTER — Encounter: Payer: Self-pay | Admitting: Internal Medicine

## 2023-09-04 VITALS — BP 120/68 | HR 59 | Temp 97.3°F | Resp 18 | Ht 69.0 in | Wt 191.0 lb

## 2023-09-04 DIAGNOSIS — E1165 Type 2 diabetes mellitus with hyperglycemia: Secondary | ICD-10-CM

## 2023-09-04 DIAGNOSIS — Z794 Long term (current) use of insulin: Secondary | ICD-10-CM | POA: Diagnosis not present

## 2023-09-04 MED ORDER — GVOKE HYPOPEN 1-PACK 0.5 MG/0.1ML ~~LOC~~ SOAJ: 0.5000 mg | SUBCUTANEOUS | 1 refills | Status: AC | PRN

## 2023-09-04 NOTE — Progress Notes (Signed)
Office Visit  Subjective   Patient ID: Matthew Ferguson   DOB: 1946/02/11   Age: 77 y.o.   MRN: [id number]   Chief Complaint Chief Complaint  Patient presents with   Elevated Bllod sugar     History of Present Illness 77 years old male is here because of high sugar. I have left messages for him and he says he replied back but I am not aware that he has called. He went to see nephrologist and his sugar was above 750 mg/dl.  He was told to come and see [place] this morning.  Patient does not like to check continuous glucose monitor.  He take Lantus 14 units daily and then get mealtime coverage with insulin aspart 5 to 7 unit with each meal.  His wife says that he drink more juices and he ate a lot of ice cream.  He says that his kidney functions are actually better than before.  No other complaint.  Past Medical History Past Medical History:  Diagnosis Date   Ascites    Chronic kidney disease    Cirrhosis (HCC)    Complication of anesthesia    Post operative vomiting for "2 weeks" per pt after cholecystectomy   Coronary artery disease involving native coronary artery 05/29/2015   05/08/12 had PCI and Xience stent to RCA for Troponin normal unstable angina with residual 40-50% proximal and 30-40 % mid LAD stenosis, EF normal 55-60%   Depression    Essential hypertension 05/29/2015   GERD (gastroesophageal reflux disease)    Hepatitis-C 03/31/2021   undetectable virus on 03/31/2021.     Irritable bowel syndrome    Mixed hyperlipidemia 05/29/2015   Palpitations    PONV (postoperative nausea and vomiting)    Tinnitus    Type 2 diabetes mellitus (HCC) 05/29/2015     Allergies No Known Allergies   Review of Systems Review of Systems  Constitutional: Negative.   Respiratory: Negative.    Cardiovascular: Negative.   Neurological: Negative.        Objective:    Vitals BP 120/68 (BP Location: Left Arm, Patient Position: Sitting, Cuff Size: Normal)   Pulse (!) 59   Temp (!) 97.3 F  (36.3 C)   Resp 18   Ht 5\' 9"  (1.753 m)   Wt 191 lb (86.6 kg)   SpO2 97%   BMI 28.21 kg/m    Physical Examination Physical Exam Constitutional:      Appearance: Normal appearance.  HENT:     Head: Normocephalic and atraumatic.  Cardiovascular:     Rate and Rhythm: Normal rate and regular rhythm.     Heart sounds: Normal heart sounds.  Pulmonary:     Effort: Pulmonary effort is normal.     Breath sounds: Normal breath sounds.  Neurological:     Mental Status: He is alert.        Assessment & Plan:   Type 2 diabetes mellitus with hyperglycemia (HCC) I have discussed with patient about cutting down on doses and ice cream.  I will increase Lantus to 14 units twice a day and he will increase Lantus 3 unit every third day if fasting sugar is above 150.  Plan was discussed with wife and patient both and we will monitor his blood sugar closely.  He will use continuous glucose monitor and will keep using continuous glucose monitor Dexcom G7.  I will also send glucagon prescription in case if she has hypoglycemia.    No follow-ups on file.  Eloisa Northern, MD

## 2023-09-04 NOTE — Assessment & Plan Note (Signed)
I have discussed with patient about cutting down on doses and ice cream.  I will increase Lantus to 14 units twice a day and he will increase Lantus 3 unit every third day if fasting sugar is above 150.  Plan was discussed with wife and patient both and we will monitor his blood sugar closely.  He will use continuous glucose monitor and will keep using continuous glucose monitor Dexcom G7.  I will also send glucagon prescription in case if she has hypoglycemia.

## 2023-09-08 ENCOUNTER — Encounter: Payer: Self-pay | Admitting: Podiatry

## 2023-09-08 ENCOUNTER — Ambulatory Visit (INDEPENDENT_AMBULATORY_CARE_PROVIDER_SITE_OTHER): Payer: Medicare Other | Admitting: Podiatry

## 2023-09-08 DIAGNOSIS — B351 Tinea unguium: Secondary | ICD-10-CM | POA: Diagnosis not present

## 2023-09-08 DIAGNOSIS — M79675 Pain in left toe(s): Secondary | ICD-10-CM

## 2023-09-08 DIAGNOSIS — M79674 Pain in right toe(s): Secondary | ICD-10-CM | POA: Diagnosis not present

## 2023-09-08 DIAGNOSIS — E1140 Type 2 diabetes mellitus with diabetic neuropathy, unspecified: Secondary | ICD-10-CM

## 2023-09-08 NOTE — Progress Notes (Signed)
  Subjective:  Patient ID: Matthew Ferguson, male    DOB: 27-Jun-1946,  MRN: [id number]  Chief Complaint  Patient presents with   [facility]    DM nail trim. Incurvating edges of right hallux nail    77 y.o. male presents with the above complaint. History confirmed with patient. Patient presenting with pain related to dystrophic thickened elongated nails. Patient is unable to trim own nails related to nail dystrophy and/or mobility issues. Patient does have a history of T2DM.   Objective:  Physical Exam: warm, good capillary refill nail exam onychomycosis of the toenails, onycholysis, and dystrophic nails DP pulses palpable, PT pulses palpable, and protective sensation absent Left Foot:  Pain with palpation of nails due to elongation and dystrophic growth.  Right Foot: Pain with palpation of nails due to elongation and dystrophic growth.   Assessment:   1. Pain due to onychomycosis of toenails of both feet   2. Type 2 diabetes mellitus with diabetic neuropathy, without long-term current use of insulin (HCC)         Plan:  Patient was evaluated and treated and all questions answered.   #Onychomycosis with pain  -Nails palliatively debrided as below. -Educated on self-care  Procedure: Nail Debridement Rationale: Pain Type of Debridement: manual, sharp debridement. Instrumentation: Nail nipper, rotary burr. Number of Nails: 10  Return in about 3 months (around 12/09/2023) for [facility].         Corinna Gab, DPM [facility] / [facility]

## 2023-10-05 ENCOUNTER — Other Ambulatory Visit: Payer: Self-pay

## 2023-10-05 MED ORDER — BD PEN NEEDLE SHORT U/F 31G X 8 MM MISC: 11 refills | Status: DC

## 2023-10-06 ENCOUNTER — Other Ambulatory Visit: Payer: Self-pay | Admitting: Internal Medicine

## 2023-10-06 DIAGNOSIS — K7460 Unspecified cirrhosis of liver: Secondary | ICD-10-CM

## 2023-10-06 MED ORDER — FUROSEMIDE 20 MG PO TABS: 20.0000 mg | ORAL_TABLET | ORAL | 1 refills | Status: DC

## 2023-10-09 ENCOUNTER — Other Ambulatory Visit: Payer: Self-pay

## 2023-10-09 ENCOUNTER — Other Ambulatory Visit: Payer: Self-pay | Admitting: Internal Medicine

## 2023-10-09 MED ORDER — BD PEN NEEDLE SHORT U/F 31G X 8 MM MISC: 11 refills | Status: DC

## 2023-10-09 MED ORDER — LANTUS SOLOSTAR 100 UNIT/ML ~~LOC~~ SOPN: 7.0000 [IU] | PEN_INJECTOR | Freq: Every day | SUBCUTANEOUS | 2 refills | Status: DC

## 2023-10-19 ENCOUNTER — Encounter: Payer: Medicare Other | Admitting: Internal Medicine

## 2023-11-06 ENCOUNTER — Other Ambulatory Visit: Payer: Self-pay | Admitting: Internal Medicine

## 2023-11-09 ENCOUNTER — Encounter: Payer: Medicare Other | Admitting: Internal Medicine

## 2023-11-17 ENCOUNTER — Encounter: Payer: Self-pay | Admitting: Podiatry

## 2023-11-17 ENCOUNTER — Ambulatory Visit (INDEPENDENT_AMBULATORY_CARE_PROVIDER_SITE_OTHER): Payer: Medicare Other | Admitting: Podiatry

## 2023-11-17 DIAGNOSIS — M79675 Pain in left toe(s): Secondary | ICD-10-CM | POA: Diagnosis not present

## 2023-11-17 DIAGNOSIS — M2142 Flat foot [pes planus] (acquired), left foot: Secondary | ICD-10-CM

## 2023-11-17 DIAGNOSIS — M2141 Flat foot [pes planus] (acquired), right foot: Secondary | ICD-10-CM | POA: Diagnosis not present

## 2023-11-17 DIAGNOSIS — M79674 Pain in right toe(s): Secondary | ICD-10-CM | POA: Diagnosis not present

## 2023-11-17 DIAGNOSIS — E1140 Type 2 diabetes mellitus with diabetic neuropathy, unspecified: Secondary | ICD-10-CM

## 2023-11-17 DIAGNOSIS — B351 Tinea unguium: Secondary | ICD-10-CM | POA: Diagnosis not present

## 2023-11-17 NOTE — Progress Notes (Signed)
  Subjective:  Patient ID: Matthew Ferguson, male    DOB: 07/09/1946,  MRN: [id number]  Chief Complaint  Patient presents with   [facility]    [facility] Last A1c was 13, but he is due for recheck tomorrow. No anticoags    78 y.o. male presents with the above complaint. History confirmed with patient. Patient presenting with pain related to dystrophic thickened elongated nails. Patient is unable to trim own nails related to nail dystrophy and/or mobility issues. Patient does have a history of T2DM.  Last recorded A1c was 13 however patient has started using glucometer, states that average reading on this is closer to 6.5. Patient is requesting diabetic shoes.  Objective:  Physical Exam: warm, good capillary refill.  Pedal hair growth absent.  Pedal skin atrophic.  Dry, xerotic, flaky pedal skin. nail exam onychomycosis of the toenails, onycholysis, and dystrophic nails DP pulses palpable, PT pulses faintly palpable, and protective sensation absent, vibratory sensation absent Left Foot:  Pain with palpation of nails due to elongation and dystrophic growth.  Right Foot: Pain with palpation of nails due to elongation and dystrophic growth.  Bilateral pes planus foot type with muscle strength 4/5 in dorsiflexion, plantarflexion, inversion, eversion.  Presenting in wheelchair today. Assessment:   1. Pain due to onychomycosis of toenails of both feet   2. Type 2 diabetes mellitus with diabetic neuropathy, without long-term current use of insulin  (HCC)   3. Pes planus of both feet         Plan:  Patient was evaluated and treated and all questions answered.   #Onychomycosis with pain  -Nails palliatively debrided as below. -Educated on self-care  Procedure: Nail Debridement Rationale: Pain Type of Debridement: manual, sharp debridement. Instrumentation: Nail nipper, rotary burr. Number of Nails: 10  # Diabetes with neuropathy # Pes planus -Patient educated on diabetes. Discussed proper diabetic  foot care and discussed risks and complications of disease. Educated patient in depth on reasons to return to the office immediately should he/she discover anything concerning or new on the feet. All questions answered. Discussed proper shoes as well.   -Patient would benefit from diabetic shoes due to neuropathy, pes planus, diminished PT pulses with trophic changes to skin and absent pedal hair growth. -Prescription written for diabetic shoes and inserts x 3.  Return in about 9 weeks (around 01/19/2024) for Diabetic Foot Care.         Ethan Saddler, DPM [facility] / [facility]

## 2023-11-18 ENCOUNTER — Ambulatory Visit: Payer: Medicare Other | Admitting: Internal Medicine

## 2023-11-18 VITALS — BP 122/60 | HR 56 | Temp 97.6°F | Resp 18 | Ht 69.0 in | Wt 196.2 lb

## 2023-11-18 DIAGNOSIS — Z0000 Encounter for general adult medical examination without abnormal findings: Secondary | ICD-10-CM | POA: Diagnosis not present

## 2023-11-18 DIAGNOSIS — Z794 Long term (current) use of insulin: Secondary | ICD-10-CM

## 2023-11-18 DIAGNOSIS — H903 Sensorineural hearing loss, bilateral: Secondary | ICD-10-CM

## 2023-11-18 DIAGNOSIS — Z6828 Body mass index (BMI) 28.0-28.9, adult: Secondary | ICD-10-CM

## 2023-11-18 DIAGNOSIS — I10 Essential (primary) hypertension: Secondary | ICD-10-CM

## 2023-11-18 DIAGNOSIS — E782 Mixed hyperlipidemia: Secondary | ICD-10-CM

## 2023-11-18 DIAGNOSIS — E1140 Type 2 diabetes mellitus with diabetic neuropathy, unspecified: Secondary | ICD-10-CM

## 2023-11-18 DIAGNOSIS — N184 Chronic kidney disease, stage 4 (severe): Secondary | ICD-10-CM | POA: Diagnosis not present

## 2023-11-18 DIAGNOSIS — F331 Major depressive disorder, recurrent, moderate: Secondary | ICD-10-CM | POA: Diagnosis not present

## 2023-11-18 NOTE — Progress Notes (Signed)
 Office Visit  Subjective   Patient ID: Matthew Ferguson   DOB: [birth year]   Age: 78 y.o.   MRN: [id number]   Chief Complaint Chief Complaint  Patient presents with   Annual Exam    Medicare annual wellness visit     History of Present Illness 78 years old male who is here for annual wellness examination. follow up.   He is married and live with his wife. He has leg weakness and use walker at home and motorized wheelchair at outside for mobility. He can transfer from bed to chair. He slide down to floor few times but now injury and can not get up by himself. He need help in bathing but he can dress himself. He does not drive.  He get flu shot every year, he also has shingle and pneumonia vaccine. He also has COVID vaccine. He does not remember when was his last tetanus shot was.  He has stage CKD-V and he follows with nephrologist. He does not need HD at this stage.   He has diabetes mellitus, he says CGM. His eye examination was with last 6 months. He also started taking lactulose  twice a day. His last HbA1c was 13 in October 24. He is due for repeat lipid panel.     He says he stay tired all the times. He has CKD V and he follows with nephrologist. He was told that his kidney function is better.    He has dyslipidemia and his LDL was 110 in 08/21/23.   Past Medical History Past Medical History:  Diagnosis Date   Ascites    Chronic kidney disease    Cirrhosis (HCC)    Complication of anesthesia    Post operative vomiting for "2 weeks" per pt after cholecystectomy   Coronary artery disease involving native coronary artery 05/29/2015   05/08/12 had PCI and Xience stent to RCA for Troponin normal unstable angina with residual 40-50% proximal and 30-40 % mid LAD stenosis, EF normal 55-60%   Depression    Essential hypertension 05/29/2015   GERD (gastroesophageal reflux disease)    Hepatitis-C 03/31/2021   undetectable virus on 03/31/2021.     Irritable bowel syndrome    Mixed  hyperlipidemia 05/29/2015   Palpitations    PONV (postoperative nausea and vomiting)    Tinnitus    Type 2 diabetes mellitus (HCC) 05/29/2015     Allergies No Known Allergies   Review of Systems Review of Systems  Constitutional: Negative.   HENT: Negative.    Respiratory: Negative.    Cardiovascular: Negative.   Gastrointestinal: Negative.   Neurological:  Positive for weakness.       Objective:    Vitals BP 122/60 (BP Location: Right Arm, Patient Position: Sitting, Cuff Size: Normal)   Pulse (!) 56   Temp 97.6 F (36.4 C)   Resp 18   Ht 5\' 9"  (1.753 m)   Wt 196 lb 4 oz (89 kg)   SpO2 99%   BMI 28.98 kg/m    Physical Examination Physical Exam Constitutional:      Appearance: Normal appearance.  HENT:     Head: Normocephalic and atraumatic.  Cardiovascular:     Rate and Rhythm: Normal rate and regular rhythm.     Heart sounds: Normal heart sounds.  Pulmonary:     Effort: Pulmonary effort is normal.     Breath sounds: Normal breath sounds.  Abdominal:     General: Bowel sounds are normal.     Palpations:  Abdomen is soft.  Neurological:     General: No focal deficit present.     Mental Status: He is alert and oriented to person, place, and time.        Assessment & Plan:   No problem-specific Assessment & Plan notes found for this encounter.    Return in about 3 months (around 02/16/2024).   Tita Form, MD

## 2023-11-20 ENCOUNTER — Other Ambulatory Visit: Payer: Self-pay | Admitting: Internal Medicine

## 2023-11-20 ENCOUNTER — Other Ambulatory Visit: Payer: Self-pay | Admitting: Cardiology

## 2023-11-20 LAB — CMP14 + ANION GAP
ALT: 22 [IU]/L (ref 0–44)
AST: 39 [IU]/L (ref 0–40)
Albumin: 3.4 g/dL — ABNORMAL LOW (ref 3.8–4.8)
Alkaline Phosphatase: 162 [IU]/L — ABNORMAL HIGH (ref 44–121)
Anion Gap: 17 mmol/L (ref 10.0–18.0)
BUN/Creatinine Ratio: 13 (ref 10–24)
BUN: 24 mg/dL (ref 8–27)
Bilirubin Total: 1.8 mg/dL — ABNORMAL HIGH (ref 0.0–1.2)
CO2: 19 mmol/L — ABNORMAL LOW (ref 20–29)
Calcium: 9.2 mg/dL (ref 8.6–10.2)
Chloride: 103 mmol/L (ref 96–106)
Creatinine, Ser: 1.78 mg/dL — ABNORMAL HIGH (ref 0.76–1.27)
Globulin, Total: 3.1 g/dL (ref 1.5–4.5)
Glucose: 148 mg/dL — ABNORMAL HIGH (ref 70–99)
Potassium: 4.5 mmol/L (ref 3.5–5.2)
Sodium: 139 mmol/L (ref 134–144)
Total Protein: 6.5 g/dL (ref 6.0–8.5)
eGFR: 38 mL/min/{1.73_m2} — ABNORMAL LOW (ref >59)

## 2023-11-20 LAB — CBC WITH DIFFERENTIAL/PLATELET
Basophils Absolute: 0.1 10*3/uL (ref 0.0–0.2)
Basos: 1 %
EOS (ABSOLUTE): 0.2 10*3/uL (ref 0.0–0.4)
Eos: 4 %
Hematocrit: 37.1 % — ABNORMAL LOW (ref 37.5–51.0)
Hemoglobin: 12.7 g/dL — ABNORMAL LOW (ref 13.0–17.7)
Immature Grans (Abs): 0 10*3/uL (ref 0.0–0.1)
Immature Granulocytes: 1 %
Lymphocytes Absolute: 1.6 10*3/uL (ref 0.7–3.1)
Lymphs: 24 %
MCH: 37.4 pg — ABNORMAL HIGH (ref 26.6–33.0)
MCHC: 34.2 g/dL (ref 31.5–35.7)
MCV: 109 fL — ABNORMAL HIGH (ref 79–97)
Monocytes Absolute: 0.8 10*3/uL (ref 0.1–0.9)
Monocytes: 11 %
Neutrophils Absolute: 4.2 10*3/uL (ref 1.4–7.0)
Neutrophils: 59 %
Platelets: 111 10*3/uL — ABNORMAL LOW (ref 150–450)
RBC: 3.4 x10E6/uL — ABNORMAL LOW (ref 4.14–5.80)
RDW: 12.6 % (ref 11.6–15.4)
WBC: 6.9 10*3/uL (ref 3.4–10.8)

## 2023-11-20 LAB — LIPID PANEL
Chol/HDL Ratio: 3.6 {ratio} (ref 0.0–5.0)
Cholesterol, Total: 200 mg/dL — ABNORMAL HIGH (ref 100–199)
HDL: 56 mg/dL (ref >39)
LDL Chol Calc (NIH): 122 mg/dL — ABNORMAL HIGH (ref 0–99)
Triglycerides: 124 mg/dL (ref 0–149)
VLDL Cholesterol Cal: 22 mg/dL (ref 5–40)

## 2023-11-20 LAB — MICROALBUMIN / CREATININE URINE RATIO
Creatinine, Urine: 22.8 mg/dL
Microalb/Creat Ratio: <13 mg/g{creat} (ref 0–29)
Microalbumin, Urine: <3 ug/mL

## 2023-11-20 LAB — HEMOGLOBIN A1C
Est. average glucose Bld gHb Est-mCnc: 171 mg/dL
Hgb A1c MFr Bld: 7.6 % — ABNORMAL HIGH (ref 4.8–5.6)

## 2023-11-20 MED ORDER — TRIAMCINOLONE ACETONIDE 0.1 % EX CREA: 1.0000 | TOPICAL_CREAM | Freq: Two times a day (BID) | CUTANEOUS | 0 refills | Status: AC

## 2023-11-25 ENCOUNTER — Other Ambulatory Visit: Payer: Self-pay

## 2023-11-25 MED ORDER — LANTUS SOLOSTAR 100 UNIT/ML ~~LOC~~ SOPN: 7.0000 [IU] | PEN_INJECTOR | Freq: Every day | SUBCUTANEOUS | 2 refills | Status: DC

## 2023-11-30 ENCOUNTER — Other Ambulatory Visit: Payer: Self-pay | Admitting: Internal Medicine

## 2023-12-07 ENCOUNTER — Other Ambulatory Visit: Payer: Self-pay | Admitting: Internal Medicine

## 2023-12-07 MED ORDER — LANTUS SOLOSTAR 100 UNIT/ML ~~LOC~~ SOPN: 7.0000 [IU] | PEN_INJECTOR | Freq: Every day | SUBCUTANEOUS | 2 refills | Status: DC

## 2023-12-08 ENCOUNTER — Other Ambulatory Visit: Payer: Self-pay | Admitting: Internal Medicine

## 2023-12-08 MED ORDER — LANTUS SOLOSTAR 100 UNIT/ML ~~LOC~~ SOPN: 7.0000 [IU] | PEN_INJECTOR | Freq: Every day | SUBCUTANEOUS | 2 refills | Status: DC

## 2023-12-08 MED ORDER — LANTUS SOLOSTAR 100 UNIT/ML ~~LOC~~ SOPN: 7.0000 [IU] | PEN_INJECTOR | Freq: Every day | SUBCUTANEOUS | 0 refills | Status: DC

## 2023-12-10 DIAGNOSIS — K7460 Unspecified cirrhosis of liver: Secondary | ICD-10-CM | POA: Insufficient documentation

## 2023-12-14 ENCOUNTER — Other Ambulatory Visit: Payer: Self-pay | Admitting: Gastroenterology

## 2023-12-14 DIAGNOSIS — N1832 Chronic kidney disease, stage 3b: Secondary | ICD-10-CM | POA: Diagnosis not present

## 2023-12-15 ENCOUNTER — Ambulatory Visit: Payer: Medicare Other | Admitting: Cardiology

## 2023-12-16 ENCOUNTER — Other Ambulatory Visit: Payer: Self-pay | Admitting: Internal Medicine

## 2023-12-16 MED ORDER — BD PEN NEEDLE SHORT U/F 31G X 8 MM MISC: 11 refills | Status: AC

## 2023-12-16 MED ORDER — INSULIN LISPRO (1 UNIT DIAL) 100 UNIT/ML (KWIKPEN): PEN_INJECTOR | SUBCUTANEOUS | 11 refills | Status: DC

## 2023-12-24 ENCOUNTER — Other Ambulatory Visit: Payer: Self-pay | Admitting: Internal Medicine

## 2023-12-24 MED ORDER — LANTUS SOLOSTAR 100 UNIT/ML ~~LOC~~ SOPN: 7.0000 [IU] | PEN_INJECTOR | Freq: Every day | SUBCUTANEOUS | 2 refills | Status: DC

## 2023-12-25 ENCOUNTER — Other Ambulatory Visit: Payer: Self-pay

## 2023-12-25 MED ORDER — INSULIN LISPRO (1 UNIT DIAL) 100 UNIT/ML (KWIKPEN): PEN_INJECTOR | SUBCUTANEOUS | 11 refills | Status: DC

## 2023-12-29 ENCOUNTER — Other Ambulatory Visit: Payer: Self-pay | Admitting: Internal Medicine

## 2023-12-29 MED ORDER — INSULIN LISPRO (1 UNIT DIAL) 100 UNIT/ML (KWIKPEN): PEN_INJECTOR | SUBCUTANEOUS | 11 refills | Status: DC

## 2023-12-29 MED ORDER — LANTUS SOLOSTAR 100 UNIT/ML ~~LOC~~ SOPN: 7.0000 [IU] | PEN_INJECTOR | Freq: Every day | SUBCUTANEOUS | 2 refills | Status: DC

## 2023-12-30 ENCOUNTER — Other Ambulatory Visit: Payer: Self-pay

## 2023-12-30 MED ORDER — INSULIN LISPRO (1 UNIT DIAL) 100 UNIT/ML (KWIKPEN): PEN_INJECTOR | SUBCUTANEOUS | 11 refills | Status: DC

## 2023-12-31 ENCOUNTER — Other Ambulatory Visit: Payer: Self-pay

## 2023-12-31 MED ORDER — INSULIN LISPRO (1 UNIT DIAL) 100 UNIT/ML (KWIKPEN): PEN_INJECTOR | SUBCUTANEOUS | 11 refills | Status: AC

## 2023-12-31 MED ORDER — LANTUS SOLOSTAR 100 UNIT/ML ~~LOC~~ SOPN: 7.0000 [IU] | PEN_INJECTOR | Freq: Every day | SUBCUTANEOUS | 2 refills | Status: DC

## 2024-01-04 NOTE — Progress Notes (Unsigned)
 Cardiology Office Note:    Date:  01/05/2024   ID:  Matthew Ferguson, DOB Oct 24, 1946, MRN [id number]  PCP:  Eloisa Northern, MD  Cardiologist:  Norman Herrlich, MD    Referring MD: Eloisa Northern, MD    ASSESSMENT:    1. Coronary artery disease involving native coronary artery of native heart, unspecified whether angina present   2. Sinus bradycardia   3. Cirrhosis of liver with ascites, unspecified hepatic cirrhosis type (HCC)   4. Hypotension due to drugs    PLAN:    In order of problems listed above:  [name] is compensated with his chronic CAD he takes a minimum dose of a selective beta-blocker we will continue it along with his oral nitrate and as needed nitroglycerin. Stable bradycardia has a smart watch and has had no alerts Stable liver disease he is not having ascites or edema I will have him take his furosemide only as needed to avoid hypotension and will continue spironolactone beneficial for his liver disease   Next appointment: 6 months   Medication Adjustments/Labs and Tests Ordered: Current medicines are reviewed at length with the patient today.  Concerns regarding medicines are outlined above.  No orders of the defined types were placed in this encounter.  Meds ordered this encounter  Medications   furosemide (LASIX) 20 MG tablet    Sig: Take 1 tablet (20 mg total) by mouth every other day. As needed    Dispense:  90 tablet    Refill:  3     History of Present Illness:    Matthew Ferguson is a 78 y.o. male with a hx of CAD with remote PCI of the right coronary artery 2013 type 2 diabetes hypertension hyperlipidemia hepatitis C infection with cirrhosis portal venous hypertension ascites and previous severe anemia associated with GI bleed last seen 06/16/2023.   Compliance with diet, lifestyle and medications: Yes  Fortunately his CAD is stable he has infrequent angina relieved with rest and nitroglycerin no change in his pattern He has difficulty with his thought process  that he attributes to his shunt and liver failure His wife decreased his spironolactone to 1 daily because of lightheadedness Takes furosemide but he is not having ascites or edema His blood pressure is mildly decreased in the office 98 systolic his wife pays close attention will make his furosemide as needed. He is not having syncope palpitation shortness of breath.  Past Medical History:  Diagnosis Date   Acute blood loss anemia 04/01/2021   Ascites    Bilateral leg edema 12/26/2022   Chest pain at rest 03/13/2022   Chronic back pain 12/11/2021   Chronic kidney disease    Cirrhosis (HCC)    Cirrhosis of liver (HCC) 12/30/2021   Complication of anesthesia    Post operative vomiting for "2 weeks" per pt after cholecystectomy   Coronary artery disease involving native coronary artery 05/29/2015   05/08/12 had PCI and Xience stent to RCA for Troponin normal unstable angina with residual 40-50% proximal and 30-40 % mid LAD stenosis, EF normal 55-60%   Depression    Diverticulosis of colon without hemorrhage    Essential hypertension 05/29/2015   GERD (gastroesophageal reflux disease)    Hearing loss 12/11/2021   Hepatic encephalopathy (HCC) 12/26/2022   Hepatitis-C 03/31/2021   undetectable virus on 03/31/2021.     Irritable bowel syndrome    Mixed hyperlipidemia 05/29/2015   Palpitations    Polyuria 06/01/2023   PONV (postoperative nausea and vomiting)  Pre-op evaluation 09/09/2019   Rectal bleeding    S/P left knee arthroscopy 08/10/2018   S/P TIPS (transjugular intrahepatic portosystemic shunt) 03/17/2022   Sensorineural hearing loss, bilateral 12/11/2021   Symptomatic anemia 03/31/2021   Tinnitus    Type 2 diabetes mellitus (HCC) 05/29/2015   Type 2 diabetes mellitus with diabetic neuropathy, unspecified (HCC) 12/11/2021   Type 2 diabetes mellitus with hyperglycemia (HCC) 06/01/2023   Unsteady gait when walking 08/21/2023    Current Medications: Current Meds   Medication Sig   acebutolol (SECTRAL) 200 MG capsule Take 1 capsule (200 mg total) by mouth daily.   Continuous Glucose Sensor (DEXCOM G7 SENSOR) MISC Apply every 10 days.   fluticasone (FLONASE) 50 MCG/ACT nasal spray USE 1 TO 2 SPRAYS IN EACH NOSTRIL ONCE DAILY AS NEEDED   furosemide (LASIX) 20 MG tablet Take 1 tablet (20 mg total) by mouth every other day. As needed   Glucagon (GVOKE HYPOPEN 1-PACK) 0.5 MG/0.1ML SOAJ Inject 0.5 mg into the skin as needed.   insulin glargine (LANTUS SOLOSTAR) 100 UNIT/ML Solostar Pen Inject 7 Units into the skin daily. INJECT 7 UNITS UNDER THE SKIN DAILY   insulin lispro (HUMALOG KWIKPEN) 100 UNIT/ML KwikPen As per sliding scle that was given to patient, max daily units 25   Insulin Pen Needle (B-D ULTRAFINE III SHORT PEN) 31G X 8 MM MISC USE WITH LANTUS PEN AS DIRECTED   isosorbide mononitrate (IMDUR) 30 MG 24 hr tablet Take 1 tablet (30 mg total) by mouth daily.   lactulose (CHRONULAC) 10 GM/15ML solution TAKE 15 ML ONCE DAILY   Microlet Lancets MISC USE AS DIRECTED   neomycin-polymyxin-hydrocortisone (CORTISPORIN) OTIC solution Apply 1 drop to both big toes at painful corners after bath/shower once a day until pain is better   nitroGLYCERIN (NITROSTAT) 0.4 MG SL tablet DISSOLVE 1 TABLET UNDER THE TONGUE EVERY 5 MINUTES AS NEEDED FOR CHEST PAIN   pantoprazole (PROTONIX) 40 MG tablet TAKE 1 TABLET IN THE MORNING   rifaximin (XIFAXAN) 550 MG TABS tablet Take 1 tablet (550 mg total) by mouth 2 (two) times daily.   sertraline (ZOLOFT) 25 MG tablet Take 1 tablet (25 mg total) by mouth daily.   spironolactone (ALDACTONE) 25 MG tablet Take 25 mg by mouth daily.   triamcinolone cream (KENALOG) 0.1 % Apply 1 Application topically 2 (two) times daily.   [DISCONTINUED] furosemide (LASIX) 20 MG tablet Take 1 tablet (20 mg total) by mouth every other day.      EKGs/Labs/Other Studies Reviewed:    The following studies were reviewed today:  Cardiac Studies &  Procedures   ______________________________________________________________________________________________   STRESS TESTS  MYOCARDIAL PERFUSION IMAGING 02/09/2020  Narrative  The left ventricular ejection fraction is hyperdynamic (>65%).  Nuclear stress EF: 68%.  There was no ST segment deviation noted during stress.  This is a low risk study.  No evidence of ischemia or MI.  Normal EF.   ECHOCARDIOGRAM  ECHOCARDIOGRAM COMPLETE 03/10/2022  Narrative ECHOCARDIOGRAM REPORT    Patient Name:   Matthew Ferguson Date of Exam: 03/10/2022 Medical Rec #:  [id number]   Height:       69.0 in Accession #:    [id number]  Weight:       186.0 lb Date of Birth:  22-Jan-1946  BSA:          2.003 m Patient Age:    75 years    BP:           137/81 mmHg Patient Gender:  M           HR:           60 bpm. Exam Location:  Outpatient  Procedure: 2D Echo, Cardiac Doppler and Color Doppler  Indications:    Pre-Op Evaluation; Pre Tips procedure  History:        Patient has no prior history of Echocardiogram examinations. CAD; Risk Factors:Hypertension, Dyslipidemia and Diabetes.  Sonographer:    Thurman Coyer RDCS Referring Phys: [id number] [facility] [address] [place]  IMPRESSIONS   1. Left ventricular ejection fraction, by estimation, is 65 to 70%. The left ventricle has normal function. The left ventricle has no regional wall motion abnormalities. There is mild concentric left ventricular hypertrophy. Left ventricular diastolic parameters are consistent with Grade II diastolic dysfunction (pseudonormalization). 2. Right ventricular systolic function is normal. The right ventricular size is normal. Tricuspid regurgitation signal is inadequate for assessing PA pressure. 3. Left atrial size was mildly dilated. 4. The mitral valve is normal in structure. Trivial mitral valve regurgitation. No evidence of mitral stenosis. 5. The aortic valve is tricuspid. Aortic valve regurgitation is not visualized. Aortic valve  sclerosis/calcification is present, without any evidence of aortic stenosis. 6. Aortic dilatation noted. There is mild dilatation of the aortic root, measuring 38 mm.  FINDINGS Left Ventricle: Left ventricular ejection fraction, by estimation, is 65 to 70%. The left ventricle has normal function. The left ventricle has no regional wall motion abnormalities. The left ventricular internal cavity size was normal in size. There is mild concentric left ventricular hypertrophy. Left ventricular diastolic parameters are consistent with Grade II diastolic dysfunction (pseudonormalization). Normal left ventricular filling pressure.  Right Ventricle: The right ventricular size is normal. No increase in right ventricular wall thickness. Right ventricular systolic function is normal. Tricuspid regurgitation signal is inadequate for assessing PA pressure.  Left Atrium: Left atrial size was mildly dilated.  Right Atrium: Right atrial size was normal in size.  Pericardium: There is no evidence of pericardial effusion.  Mitral Valve: The mitral valve is normal in structure. Trivial mitral valve regurgitation. No evidence of mitral valve stenosis.  Tricuspid Valve: The tricuspid valve is normal in structure. Tricuspid valve regurgitation is trivial. No evidence of tricuspid stenosis.  Aortic Valve: The aortic valve is tricuspid. Aortic valve regurgitation is not visualized. Aortic valve sclerosis/calcification is present, without any evidence of aortic stenosis.  Pulmonic Valve: The pulmonic valve was normal in structure. Pulmonic valve regurgitation is not visualized. No evidence of pulmonic stenosis.  Aorta: Aortic dilatation noted. There is mild dilatation of the aortic root, measuring 38 mm.  IAS/Shunts: No atrial level shunt detected by color flow Doppler.   LEFT VENTRICLE PLAX 2D LVIDd:         4.10 cm      Diastology LVIDs:         2.50 cm      LV e' medial:    6.57 cm/s LV PW:         1.20 cm       LV E/e' medial:  10.7 LV IVS:        1.20 cm      LV e' lateral:   8.06 cm/s LVOT diam:     2.50 cm      LV E/e' lateral: 8.7 LV SV:         111 LV SV Index:   56 LVOT Area:     4.91 cm  LV Volumes (MOD) LV vol d, MOD A2C: 144.0 ml LV  vol d, MOD A4C: 84.8 ml LV vol s, MOD A2C: 51.6 ml LV vol s, MOD A4C: 32.0 ml LV SV MOD A2C:     92.4 ml LV SV MOD A4C:     84.8 ml LV SV MOD BP:      77.7 ml  RIGHT VENTRICLE RV Basal diam:  3.00 cm RV S prime:     11.22 cm/s TAPSE (M-mode): 2.0 cm  LEFT ATRIUM             Index        RIGHT ATRIUM           Index LA diam:        3.60 cm 1.80 cm/m   RA Area:     15.40 cm LA Vol (A2C):   66.5 ml 33.20 ml/m  RA Volume:   32.70 ml  16.32 ml/m LA Vol (A4C):   72.0 ml 35.94 ml/m LA Biplane Vol: 71.3 ml 35.59 ml/m AORTIC VALVE LVOT Vmax:   107.00 cm/s LVOT Vmean:  65.500 cm/s LVOT VTI:    0.227 m  AORTA Ao Root diam: 3.80 cm Ao Asc diam:  3.50 cm  MITRAL VALVE MV Area (PHT): 3.72 cm    SHUNTS MV Decel Time: 204 msec    Systemic VTI:  0.23 m MV E velocity: 70.30 cm/s  Systemic Diam: 2.50 cm MV A velocity: 63.00 cm/s MV E/A ratio:  1.12  Armanda Magic MD Electronically signed by Armanda Magic MD Signature Date/Time: 03/10/2022/2:35:40 PM    Final    MONITORS  LONG TERM MONITOR-LIVE TELEMETRY (3-14 DAYS) 01/21/2022  Narrative Patch Wear Time:  13 days and 18 hours (2023-03-01T15:52:30-0500 to 2023-03-15T11:48:22-0400)  Patient had a min HR of 44 bpm, max HR of 203 bpm, and avg HR of 67 bpm. Predominant underlying rhythm was Sinus Rhythm.  Isolated SVEs were occasional (1.3%, 17727), SVE Couplets were rare (<1.0%, 41), and SVE Triplets were rare (<1.0%, 5).  Isolated VEs were rare (<1.0%, 591), VE Couplets were rare (<1.0%, 9), and VE Triplets were rare (<1.0%, 1). Ventricular Bigeminy and Trigeminy were present. 1 run of Ventricular Tachycardia occurred lasting 6 beats with a max rate of 203 bpm (avg 120 bpm).  There were no  episodes of atrial fibrillation or flutter.  There were 10 triggered and 8 diary events. 4 episodes were associated with frequent PVCs and bigeminy 2 episodes occurred frequent APCs The episodes of SVT were asymptomatic       ______________________________________________________________________________________________          Recent Labs: 11/18/2023: ALT 22; BUN 24; Creatinine, Ser 1.78; Hemoglobin 12.7; Platelets 111; Potassium 4.5; Sodium 139  Recent Lipid Panel    Component Value Date/Time   CHOL 200 (H) 11/18/2023 1146   TRIG 124 11/18/2023 1146   HDL 56 11/18/2023 1146   CHOLHDL 3.6 11/18/2023 1146   LDLCALC 122 (H) 11/18/2023 1146    Physical Exam:    VS:  BP (!) 98/56   Pulse (!) 58   Ht 5\' 9"  (1.753 m)   Wt 193 lb (87.5 kg)   SpO2 95%   BMI 28.50 kg/m     Wt Readings from Last 3 Encounters:  01/05/24 193 lb (87.5 kg)  11/18/23 196 lb 4 oz (89 kg)  09/04/23 191 lb (86.6 kg)     GEN: He does not look chronically ill or debilitated he does not have icterus well nourished, well developed in no acute distress HEENT: Normal NECK: No JVD; No carotid  bruits LYMPHATICS: No lymphadenopathy CARDIAC: RRR, no murmurs, rubs, gallops RESPIRATORY:  Clear to auscultation without rales, wheezing or rhonchi  ABDOMEN: Soft, non-tender, non-distended MUSCULOSKELETAL:  No edema; No deformity  SKIN: Warm and dry NEUROLOGIC:  Alert and oriented x 3 PSYCHIATRIC:  Normal affect    Signed, Norman Herrlich, MD  01/05/2024 12:12 PM    [facility]

## 2024-01-05 ENCOUNTER — Encounter: Payer: Self-pay | Admitting: Cardiology

## 2024-01-05 ENCOUNTER — Ambulatory Visit: Payer: Medicare Other | Attending: Cardiology | Admitting: Cardiology

## 2024-01-05 VITALS — BP 98/56 | HR 58 | Ht 69.0 in | Wt 193.0 lb

## 2024-01-05 DIAGNOSIS — I952 Hypotension due to drugs: Secondary | ICD-10-CM | POA: Diagnosis not present

## 2024-01-05 DIAGNOSIS — K7460 Unspecified cirrhosis of liver: Secondary | ICD-10-CM | POA: Diagnosis not present

## 2024-01-05 DIAGNOSIS — R188 Other ascites: Secondary | ICD-10-CM | POA: Insufficient documentation

## 2024-01-05 DIAGNOSIS — I2510 Atherosclerotic heart disease of native coronary artery without angina pectoris: Secondary | ICD-10-CM | POA: Diagnosis not present

## 2024-01-05 DIAGNOSIS — R001 Bradycardia, unspecified: Secondary | ICD-10-CM | POA: Insufficient documentation

## 2024-01-05 MED ORDER — FUROSEMIDE 20 MG PO TABS: 20.0000 mg | ORAL_TABLET | ORAL | 3 refills | Status: DC

## 2024-01-05 NOTE — Patient Instructions (Signed)
 Medication Instructions:  Your physician has recommended you make the following change in your medication:  START: Furosemide 20 mg every other day as needed  *If you need a refill on your cardiac medications before your next appointment, please call your pharmacy*   Lab Work: None If you have labs (blood work) drawn today and your tests are completely normal, you will receive your results only by: MyChart Message (if you have MyChart) OR A paper copy in the mail If you have any lab test that is abnormal or we need to change your treatment, we will call you to review the results.   Testing/Procedures: None   Follow-Up: At [facility], you and your health needs are our priority.  As part of our continuing mission to provide you with exceptional heart care, we have created designated Provider Care Teams.  These Care Teams include your primary Cardiologist (physician) and Advanced Practice Providers (APPs -  Physician Assistants and Nurse Practitioners) who all work together to provide you with the care you need, when you need it.  We recommend signing up for the patient portal called "MyChart".  Sign up information is provided on this After Visit Summary.  MyChart is used to connect with patients for Virtual Visits (Telemedicine).  Patients are able to view lab/test results, encounter notes, upcoming appointments, etc.  Non-urgent messages can be sent to your provider as well.   To learn more about what you can do with MyChart, go to [email or web address].    Your next appointment:   6 month(s)  Provider:   Norman Herrlich, MD    Other Instructions None

## 2024-01-06 DIAGNOSIS — E119 Type 2 diabetes mellitus without complications: Secondary | ICD-10-CM | POA: Diagnosis not present

## 2024-01-06 DIAGNOSIS — I10 Essential (primary) hypertension: Secondary | ICD-10-CM | POA: Diagnosis not present

## 2024-01-07 ENCOUNTER — Encounter: Payer: Self-pay | Admitting: Cardiology

## 2024-01-07 ENCOUNTER — Telehealth: Payer: Self-pay | Admitting: Cardiology

## 2024-01-07 ENCOUNTER — Telehealth: Payer: Self-pay

## 2024-01-07 NOTE — Telephone Encounter (Signed)
 Spoke with Matthew Ferguson pharmacist, clarified recent furosemide change per Dr. Hulen Shouts 01/05/2024 note:     Matthew Ferguson is compensated with his chronic CAD he takes a minimum dose of a selective beta-blocker we will continue it along with his oral nitrate and as needed nitroglycerin. Stable bradycardia has a smart watch and has had no alerts Stable liver disease he is not having ascites or edema I will have him take his furosemide only as needed to avoid hypotension and will continue spironolactone beneficial for his liver disease    furosemide (LASIX) 20 MG tablet [[id number]]   Order Details Dose: 20 mg Route: Oral Frequency: Every other day  Dispense Quantity: 90 tablet Refills: 3        Sig: Take 1 tablet (20 mg total) by mouth every other day. As needed       Start Date: 01/05/24 End Date: --  Written Date: 01/05/24 Expiration Date: 01/04/25  Providers  Ordering and Authorizing Provider: Baldo Daub, MD [address] [place], [place] [address] Phone: [phone]   Fax: [phone] DEA #: [id number]   NPI: [id number]      Ordering User: Matthew Frederic, RN      Pharmacy  [facility] - [place], [place] - [address], [place] [address] Phone: [phone]  Fax: [phone] DEA #: --  DAW Reason: --

## 2024-01-07 NOTE — Telephone Encounter (Signed)
 Patient is setting up [employer] app to send ECG results to Dr. Dulce Sellar. He would like to know the best method to do so. Please advise.

## 2024-01-07 NOTE — Telephone Encounter (Signed)
 Advised pt that his strip came through via MyChart.

## 2024-01-07 NOTE — Telephone Encounter (Signed)
 Instructions sent via MyChart for pt. Pt verbalized understanding and had no other questions.

## 2024-01-11 ENCOUNTER — Encounter: Payer: Self-pay | Admitting: Cardiology

## 2024-01-13 ENCOUNTER — Other Ambulatory Visit: Payer: Self-pay

## 2024-01-14 ENCOUNTER — Encounter: Payer: Self-pay | Admitting: Cardiology

## 2024-01-14 ENCOUNTER — Other Ambulatory Visit: Payer: Self-pay

## 2024-01-14 MED ORDER — LANTUS SOLOSTAR 100 UNIT/ML ~~LOC~~ SOPN: 7.0000 [IU] | PEN_INJECTOR | Freq: Every day | SUBCUTANEOUS | 2 refills | Status: DC

## 2024-01-19 DIAGNOSIS — I129 Hypertensive chronic kidney disease with stage 1 through stage 4 chronic kidney disease, or unspecified chronic kidney disease: Secondary | ICD-10-CM | POA: Diagnosis not present

## 2024-01-19 DIAGNOSIS — D631 Anemia in chronic kidney disease: Secondary | ICD-10-CM | POA: Diagnosis not present

## 2024-01-19 DIAGNOSIS — N1832 Chronic kidney disease, stage 3b: Secondary | ICD-10-CM | POA: Diagnosis not present

## 2024-01-19 DIAGNOSIS — N2581 Secondary hyperparathyroidism of renal origin: Secondary | ICD-10-CM | POA: Diagnosis not present

## 2024-01-19 DIAGNOSIS — E1129 Type 2 diabetes mellitus with other diabetic kidney complication: Secondary | ICD-10-CM | POA: Diagnosis not present

## 2024-01-19 DIAGNOSIS — K7460 Unspecified cirrhosis of liver: Secondary | ICD-10-CM | POA: Diagnosis not present

## 2024-01-19 DIAGNOSIS — R609 Edema, unspecified: Secondary | ICD-10-CM | POA: Diagnosis not present

## 2024-01-19 DIAGNOSIS — E1122 Type 2 diabetes mellitus with diabetic chronic kidney disease: Secondary | ICD-10-CM | POA: Diagnosis not present

## 2024-01-19 DIAGNOSIS — N189 Chronic kidney disease, unspecified: Secondary | ICD-10-CM | POA: Diagnosis not present

## 2024-01-22 ENCOUNTER — Other Ambulatory Visit: Payer: Self-pay

## 2024-01-22 MED ORDER — LANTUS SOLOSTAR 100 UNIT/ML ~~LOC~~ SOPN: 7.0000 [IU] | PEN_INJECTOR | Freq: Every day | SUBCUTANEOUS | 2 refills | Status: DC

## 2024-01-23 ENCOUNTER — Encounter: Payer: Self-pay | Admitting: Cardiology

## 2024-01-25 ENCOUNTER — Other Ambulatory Visit: Payer: Self-pay

## 2024-01-25 MED ORDER — LANTUS SOLOSTAR 100 UNIT/ML ~~LOC~~ SOPN: 7.0000 [IU] | PEN_INJECTOR | Freq: Every day | SUBCUTANEOUS | 0 refills | Status: DC

## 2024-01-25 MED ORDER — LANTUS SOLOSTAR 100 UNIT/ML ~~LOC~~ SOPN: 7.0000 [IU] | PEN_INJECTOR | Freq: Every day | SUBCUTANEOUS | 2 refills | Status: DC

## 2024-01-26 ENCOUNTER — Ambulatory Visit: Payer: Medicare Other | Admitting: Podiatry

## 2024-01-26 ENCOUNTER — Ambulatory Visit (INDEPENDENT_AMBULATORY_CARE_PROVIDER_SITE_OTHER): Payer: Medicare Other | Admitting: Podiatry

## 2024-01-26 ENCOUNTER — Ambulatory Visit: Payer: Medicare Other

## 2024-01-26 ENCOUNTER — Encounter: Payer: Self-pay | Admitting: Podiatry

## 2024-01-26 DIAGNOSIS — M2141 Flat foot [pes planus] (acquired), right foot: Secondary | ICD-10-CM

## 2024-01-26 DIAGNOSIS — B351 Tinea unguium: Secondary | ICD-10-CM | POA: Diagnosis not present

## 2024-01-26 DIAGNOSIS — E1140 Type 2 diabetes mellitus with diabetic neuropathy, unspecified: Secondary | ICD-10-CM

## 2024-01-26 DIAGNOSIS — M79674 Pain in right toe(s): Secondary | ICD-10-CM

## 2024-01-26 DIAGNOSIS — M79675 Pain in left toe(s): Secondary | ICD-10-CM

## 2024-01-26 DIAGNOSIS — M2040 Other hammer toe(s) (acquired), unspecified foot: Secondary | ICD-10-CM

## 2024-01-26 NOTE — Progress Notes (Unsigned)
  Subjective:  Patient ID: Matthew Ferguson, male    DOB: 1946/03/28,  MRN: [id number]  Chief Complaint  Patient [facility] with   [facility] with out callous today. Last A1c was in Jan it was 7.6. and no anti coag.     78 y.o. male presents with the above complaint. History confirmed with patient. Patient presenting with pain related to dystrophic thickened elongated nails. Patient is unable to trim own nails related to nail dystrophy and/or mobility issues. Patient does have a history of T2DM.  Last recorded A1c 7.6.  Getting fitted for diabetic shoes later today.  Patient, using wheelchair.  Objective:  Physical Exam: warm, good capillary refill.  Pedal hair growth absent.  Pedal skin atrophic.  Dry, xerotic, flaky pedal skin. nail exam onychomycosis of the toenails, onycholysis, and dystrophic nails DP pulses palpable, PT pulses faintly palpable, and protective sensation absent, vibratory sensation absent Left Foot:  Pain with palpation of nails due to elongation and dystrophic growth.  Right Foot: Pain with palpation of nails due to elongation and dystrophic growth.  Bilateral pes planus foot type with muscle strength 4/5 in dorsiflexion, plantarflexion, inversion, eversion.  Presenting in wheelchair today. Assessment:   1. Pain due to onychomycosis of toenails of both feet   2. Type 2 diabetes mellitus with diabetic neuropathy, without long-term current use of insulin (HCC)         Plan:  Patient was evaluated and treated and all questions answered.   #Onychomycosis with pain  -Nails palliatively debrided as below. -Educated on self-care  Procedure: Nail Debridement Rationale: Pain Type of Debridement: manual, sharp debridement. Instrumentation: Nail nipper, rotary burr. Number of Nails: 10  # Diabetes with neuropathy -Patient educated on diabetes. Discussed proper diabetic foot care and discussed risks and complications of disease. Educated patient in depth on reasons to  return to the office immediately should he/she discover anything concerning or new on the feet. All questions answered. Discussed proper shoes as well.    Return in about 9 weeks (around 03/29/2024) for Diabetic Foot Care.         Bronwen Betters, DPM [facility]

## 2024-01-26 NOTE — Progress Notes (Signed)
 Patient presents to the office today for diabetic shoe and insole measuring.  Patient was measured with brannock device to determine size and width for 1 pair of extra depth shoes and foam casted for 3 pair of insoles.   Documentation of medical necessity will be sent to patient's treating diabetic doctor to verify and sign.   Patient's diabetic provider: Eloisa Northern MD   Shoes and insoles will be ordered at that time and patient will be notified for an appointment for fitting when they arrive.   Shoe size (per patient): 11W Shoe choice:  LT200M / 1260W Shoe size ordered: 11WD  PPW and ABN signed

## 2024-01-30 ENCOUNTER — Encounter: Payer: Self-pay | Admitting: Cardiology

## 2024-02-05 ENCOUNTER — Other Ambulatory Visit: Payer: Self-pay | Admitting: Internal Medicine

## 2024-02-06 DIAGNOSIS — I10 Essential (primary) hypertension: Secondary | ICD-10-CM | POA: Diagnosis not present

## 2024-02-06 DIAGNOSIS — E119 Type 2 diabetes mellitus without complications: Secondary | ICD-10-CM | POA: Diagnosis not present

## 2024-02-07 ENCOUNTER — Other Ambulatory Visit: Payer: Self-pay | Admitting: Internal Medicine

## 2024-02-09 ENCOUNTER — Encounter: Payer: Self-pay | Admitting: Cardiology

## 2024-02-12 ENCOUNTER — Encounter: Payer: Self-pay | Admitting: Internal Medicine

## 2024-02-12 ENCOUNTER — Ambulatory Visit: Payer: Medicare Other | Admitting: Internal Medicine

## 2024-02-12 VITALS — BP 120/63 | HR 61 | Temp 98.3°F | Resp 18 | Ht 69.0 in | Wt 202.0 lb

## 2024-02-12 DIAGNOSIS — E782 Mixed hyperlipidemia: Secondary | ICD-10-CM | POA: Diagnosis not present

## 2024-02-12 DIAGNOSIS — E1140 Type 2 diabetes mellitus with diabetic neuropathy, unspecified: Secondary | ICD-10-CM | POA: Diagnosis not present

## 2024-02-12 DIAGNOSIS — Z794 Long term (current) use of insulin: Secondary | ICD-10-CM

## 2024-02-12 DIAGNOSIS — K7460 Unspecified cirrhosis of liver: Secondary | ICD-10-CM | POA: Diagnosis not present

## 2024-02-12 DIAGNOSIS — N1832 Chronic kidney disease, stage 3b: Secondary | ICD-10-CM | POA: Diagnosis not present

## 2024-02-12 DIAGNOSIS — I10 Essential (primary) hypertension: Secondary | ICD-10-CM | POA: Diagnosis not present

## 2024-02-12 DIAGNOSIS — R188 Other ascites: Secondary | ICD-10-CM | POA: Diagnosis not present

## 2024-02-12 MED ORDER — LANTUS SOLOSTAR 100 UNIT/ML ~~LOC~~ SOPN: 24.0000 [IU] | PEN_INJECTOR | Freq: Two times a day (BID) | SUBCUTANEOUS | 6 refills | Status: DC

## 2024-02-12 MED ORDER — ROSUVASTATIN CALCIUM 10 MG PO TABS: 10.0000 mg | ORAL_TABLET | Freq: Every day | ORAL | 6 refills | Status: DC

## 2024-02-12 NOTE — Progress Notes (Signed)
 Office Visit  Subjective   Patient ID: Matthew Ferguson   DOB: [birth year]   Age: 78 y.o.   MRN: [id number]   Chief Complaint Chief Complaint  Patient presents with   Hypertension    3 month follow up     History of Present Illness 78 years old male is here for follow up. He says he takes lantus  24 units in the morning and 24 units in the evening. He does not get enough insulin  from pharmacy so he ran out of insulin  and his sugar goes high. He has stage CKD-IIIb to CKD IV. His GFR was 38 on 11/18/2023. He follows with nephrologist.    He has diabetes mellitus, he is not using CGM and he is using telemonitor so his readings will come to our office. His Hb A1c was 7.6% on 11/18/2023.      He has cirrhosis of liver with ascites.   He says that his ascites are better.    He has dyslipidemia and his LDL was 122 in 11/18/2023.     He also has chronic kidney disease but his kidney function was better in January his GFR was 38. He follows with nephrologist.  Past Medical History Past Medical History:  Diagnosis Date   Acute blood loss anemia 04/01/2021   Ascites    Bilateral leg edema 12/26/2022   Chest pain at rest 03/13/2022   Chronic back pain 12/11/2021   Chronic kidney disease    Cirrhosis (HCC)    Cirrhosis of liver (HCC) 12/30/2021   Complication of anesthesia    Post operative vomiting for "2 weeks" per pt after cholecystectomy   Coronary artery disease involving native coronary artery 05/29/2015   05/08/12 had PCI and Xience stent to RCA for Troponin normal unstable angina with residual 40-50% proximal and 30-40 % mid LAD stenosis, EF normal 55-60%   Depression    Diverticulosis of colon without hemorrhage    Essential hypertension 05/29/2015   GERD (gastroesophageal reflux disease)    Hearing loss 12/11/2021   Hepatic encephalopathy (HCC) 12/26/2022   Hepatitis-C 03/31/2021   undetectable virus on 03/31/2021.     Irritable bowel syndrome    Mixed hyperlipidemia 05/29/2015    Palpitations    Polyuria 06/01/2023   PONV (postoperative nausea and vomiting)    Pre-op evaluation 09/09/2019   Rectal bleeding    S/P left knee arthroscopy 08/10/2018   S/P TIPS (transjugular intrahepatic portosystemic shunt) 03/17/2022   Sensorineural hearing loss, bilateral 12/11/2021   Symptomatic anemia 03/31/2021   Tinnitus    Type 2 diabetes mellitus (HCC) 05/29/2015   Type 2 diabetes mellitus with diabetic neuropathy, unspecified (HCC) 12/11/2021   Type 2 diabetes mellitus with hyperglycemia (HCC) 06/01/2023   Unsteady gait when walking 08/21/2023     Allergies No Known Allergies   Review of Systems Review of Systems  Constitutional: Negative.   Respiratory: Negative.    Cardiovascular: Negative.   Gastrointestinal: Negative.   Neurological: Negative.        Objective:    Vitals BP 120/63 (BP Location: Left Arm, Patient Position: Sitting, Cuff Size: Normal)   Pulse 61   Temp 98.3 F (36.8 C)   Resp 18   Ht 5\' 9"  (1.753 m)   Wt 202 lb (91.6 kg)   SpO2 97%   BMI 29.83 kg/m    Physical Examination Physical Exam Constitutional:      Appearance: Normal appearance.  HENT:     Head: Normocephalic and atraumatic.  Cardiovascular:  Rate and Rhythm: Normal rate and regular rhythm.     Heart sounds: Normal heart sounds.  Pulmonary:     Effort: Pulmonary effort is normal.     Breath sounds: Normal breath sounds.  Abdominal:     General: Bowel sounds are normal.     Palpations: Abdomen is soft.  Neurological:     General: No focal deficit present.     Mental Status: He is alert and oriented to person, place, and time.        Assessment & Plan:   Essential hypertension   His blood pressure is better.  Cirrhosis of liver (HCC)   His ascites are better he will continue with spironolactone  25 mg and Lasix  20 mg daily.  He will continue with lactulose  as well.  Type 2 diabetes mellitus with diabetic neuropathy, unspecified (HCC)   His hemoglobin A1c  was 7.08 November 2023. He will continue with Lantus .  Chronic kidney disease   His kidney function was better with GFR of 38 that make it CKD 3B.  Mixed hyperlipidemia   His LDL is not at target control.  I will start him on rosuvastatin  10 mg daily.    Return in about 3 months (around 05/13/2024).   Tita Form, MD

## 2024-02-15 ENCOUNTER — Other Ambulatory Visit: Payer: Self-pay

## 2024-02-15 MED ORDER — LANTUS SOLOSTAR 100 UNIT/ML ~~LOC~~ SOPN: 24.0000 [IU] | PEN_INJECTOR | Freq: Two times a day (BID) | SUBCUTANEOUS | 6 refills | Status: DC

## 2024-02-17 ENCOUNTER — Other Ambulatory Visit (HOSPITAL_COMMUNITY): Payer: Self-pay

## 2024-02-22 ENCOUNTER — Telehealth: Payer: Self-pay

## 2024-02-22 NOTE — Telephone Encounter (Signed)
 Lm for patient that shoes are in / patient has appt for RFC on 5/27 1:15 with Dr Marvis Sluder asked him to [facility] if he needs to be fit sooner  Britton Cane CPed, CFo, CFm

## 2024-03-03 ENCOUNTER — Other Ambulatory Visit: Payer: Self-pay | Admitting: Internal Medicine

## 2024-03-04 ENCOUNTER — Other Ambulatory Visit: Payer: Self-pay

## 2024-03-04 MED ORDER — ROSUVASTATIN CALCIUM 10 MG PO TABS: 10.0000 mg | ORAL_TABLET | Freq: Every day | ORAL | 6 refills | Status: DC

## 2024-03-07 DIAGNOSIS — I10 Essential (primary) hypertension: Secondary | ICD-10-CM

## 2024-03-07 DIAGNOSIS — E119 Type 2 diabetes mellitus without complications: Secondary | ICD-10-CM

## 2024-03-15 ENCOUNTER — Other Ambulatory Visit: Payer: Self-pay

## 2024-03-15 MED ORDER — LANTUS SOLOSTAR 100 UNIT/ML ~~LOC~~ SOPN: 24.0000 [IU] | PEN_INJECTOR | Freq: Two times a day (BID) | SUBCUTANEOUS | 6 refills | Status: DC

## 2024-03-22 DIAGNOSIS — L820 Inflamed seborrheic keratosis: Secondary | ICD-10-CM | POA: Diagnosis not present

## 2024-03-22 DIAGNOSIS — D485 Neoplasm of uncertain behavior of skin: Secondary | ICD-10-CM | POA: Diagnosis not present

## 2024-03-22 DIAGNOSIS — L821 Other seborrheic keratosis: Secondary | ICD-10-CM | POA: Diagnosis not present

## 2024-03-25 ENCOUNTER — Encounter (INDEPENDENT_AMBULATORY_CARE_PROVIDER_SITE_OTHER): Admitting: Podiatry

## 2024-03-25 DIAGNOSIS — Z91199 Patient's noncompliance with other medical treatment and regimen due to unspecified reason: Secondary | ICD-10-CM

## 2024-03-25 NOTE — Progress Notes (Signed)
Patient did not show for scheduled appointment today.

## 2024-03-29 ENCOUNTER — Ambulatory Visit: Admitting: Podiatry

## 2024-03-30 DIAGNOSIS — D0439 Carcinoma in situ of skin of other parts of face: Secondary | ICD-10-CM | POA: Diagnosis not present

## 2024-03-30 NOTE — Assessment & Plan Note (Signed)
His blood pressure is better.

## 2024-03-30 NOTE — Assessment & Plan Note (Signed)
 His hemoglobin A1c was 7.08 November 2023. He will continue with Lantus .

## 2024-03-30 NOTE — Assessment & Plan Note (Signed)
 His kidney function was better with GFR of 38 that make it CKD 3B.

## 2024-03-30 NOTE — Assessment & Plan Note (Signed)
 His LDL is not at target control.  I will start him on rosuvastatin  10 mg daily.

## 2024-03-30 NOTE — Assessment & Plan Note (Signed)
 His ascites are better he will continue with spironolactone  25 mg and Lasix  20 mg daily.  He will continue with lactulose  as well.

## 2024-03-31 ENCOUNTER — Other Ambulatory Visit: Payer: Self-pay | Admitting: Interventional Radiology

## 2024-03-31 DIAGNOSIS — K766 Portal hypertension: Secondary | ICD-10-CM

## 2024-04-01 ENCOUNTER — Ambulatory Visit
Admission: RE | Admit: 2024-04-01 | Discharge: 2024-04-01 | Disposition: A | Discharge status: Home / Self Care | Source: Ambulatory Visit | Attending: Interventional Radiology | Admitting: Interventional Radiology

## 2024-04-01 DIAGNOSIS — K766 Portal hypertension: Secondary | ICD-10-CM | POA: Diagnosis not present

## 2024-04-01 NOTE — Progress Notes (Signed)
 This encounter was conducted via the [employer] providing interactive audio and visual communication.  The patient provided verbal consent to conduct a virtual appointment.  The patient was located at their primary residence during this encounter.  Referring Physician(s): Dr. Dominic Friendly  Supervising Physician: Creasie Doctor  Chief Complaint: The patient is seen in follow up today s/p TIPS creation 03/17/22  History of present illness: HPI from last clinic visit 08/05/23 78 y.o. male with history of cryptogenic cirrhosis (Child Pugh B, MELD 16) with portal hypertension and recurrent ascites in the setting of decreased diuresis limited by renal function.  He is status post TIPS creation on 03/17/22 which was uncomplicated and achieved a portosystemic mean pressure gradient reduction from 15 to 7 mmHg without post-deployment balloon dilation.    He presents today via telephone virtual clinic visit.     Describes some intermittent chest pain centrally and to the back of his abdomen.  He takes nitrates and it typically resolves - he's discussed this with his Cardiologist.  He describes some poor memory.  He continues to take lactulose , typically 2-3 x per day, with 1-3 bowel movements per day.  He describes some urinary incontinence.  He is currently taking furosemide  40 mg daily, spironolactone  25 mg daily.  This is down from his prior dosages.  Improved leg swelling, some residual.  No recurrent ascites or requirement for additional paracentesis.  Steady weight 190 lbs.    He was compliant with lactulose  but requested to transition to a different medication and was started on Rifaximin  550 mg BID. He was encouraged to continue taking his diuretics as prescribed and we made plans to follow up in 6 months. Mr. Luhman followed up with our team today via virtual visit and is doing very well with the exception of frequent urination. He describes nocturia 3-4 time nightly and having to void frequently  during the day. He has some incontinence which he states makes it very difficult to leave the house. He is taking his medications as directed. He denies any abdominal swelling, lower extremity edema or shortness of breath. His mental status is at baseline per his wife Deatra Face who participated in the visit.   Past Medical History:  Diagnosis Date   Acute blood loss anemia 04/01/2021   Ascites    Bilateral leg edema 12/26/2022   Chest pain at rest 03/13/2022   Chronic back pain 12/11/2021   Chronic kidney disease    Cirrhosis (HCC)    Cirrhosis of liver (HCC) 12/30/2021   Complication of anesthesia    Post operative vomiting for "2 weeks" per pt after cholecystectomy   Coronary artery disease involving native coronary artery 05/29/2015   05/08/12 had PCI and Xience stent to RCA for Troponin normal unstable angina with residual 40-50% proximal and 30-40 % mid LAD stenosis, EF normal 55-60%   Depression    Diverticulosis of colon without hemorrhage    Essential hypertension 05/29/2015   GERD (gastroesophageal reflux disease)    Hearing loss 12/11/2021   Hepatic encephalopathy (HCC) 12/26/2022   Hepatitis-C 03/31/2021   undetectable virus on 03/31/2021.     Irritable bowel syndrome    Mixed hyperlipidemia 05/29/2015   Palpitations    Polyuria 06/01/2023   PONV (postoperative nausea and vomiting)    Pre-op evaluation 09/09/2019   Rectal bleeding    S/P left knee arthroscopy 08/10/2018   S/P TIPS (transjugular intrahepatic portosystemic shunt) 03/17/2022   Sensorineural hearing loss, bilateral 12/11/2021   Symptomatic anemia 03/31/2021  Tinnitus    Type 2 diabetes mellitus (HCC) 05/29/2015   Type 2 diabetes mellitus with diabetic neuropathy, unspecified (HCC) 12/11/2021   Type 2 diabetes mellitus with hyperglycemia (HCC) 06/01/2023   Unsteady gait when walking 08/21/2023    Past Surgical History:  Procedure Laterality Date   BACK SURGERY     CARDIAC CATHETERIZATION      CHOLECYSTECTOMY     COLONOSCOPY WITH PROPOFOL  N/A 04/02/2021   Procedure: COLONOSCOPY WITH PROPOFOL ;  Surgeon: Tobin Forts, MD;  Location: [facility] ENDOSCOPY;  Service: Endoscopy;  Laterality: N/A;   CORONARY ANGIOPLASTY WITH STENT PLACEMENT     ESOPHAGOGASTRODUODENOSCOPY (EGD) WITH PROPOFOL  N/A 04/01/2021   Procedure: ESOPHAGOGASTRODUODENOSCOPY (EGD) WITH PROPOFOL ;  Surgeon: Albertina Hugger, MD;  Location: [facility] ENDOSCOPY;  Service: Gastroenterology;  Laterality: N/A;   INGUINAL HERNIA REPAIR Bilateral 01/2021   IR INTRAVASCULAR ULTRASOUND NON CORONARY  03/17/2022   IR PARACENTESIS  10/17/2021   IR PARACENTESIS  01/24/2022   IR PARACENTESIS  02/19/2022   IR PARACENTESIS  03/05/2022   IR PARACENTESIS  03/17/2022   IR PARACENTESIS  05/05/2022   IR RADIOLOGIST EVAL & MGMT  02/24/2022   IR RADIOLOGIST EVAL & MGMT  04/17/2022   IR RADIOLOGIST EVAL & MGMT  05/14/2022   IR RADIOLOGIST EVAL & MGMT  07/23/2022   IR RADIOLOGIST EVAL & MGMT  01/12/2023   IR RADIOLOGIST EVAL & MGMT  08/05/2023   IR TIPS  03/17/2022   IR US  GUIDE VASC ACCESS RIGHT  03/17/2022   KNEE SURGERY     RADIOLOGY WITH ANESTHESIA N/A 03/17/2022   Procedure: TIPS;  Surgeon: Federico Hopkins, MD;  Location: [facility] OR;  Service: Radiology;  Laterality: N/A;    Allergies: Patient has no known allergies.  Medications: Prior to Admission medications   Medication Sig Start Date End Date Taking? Authorizing Provider  acebutolol  (SECTRAL ) 200 MG capsule Take 1 capsule (200 mg total) by mouth daily. 09/01/23   Hassan Links, MD  Continuous Glucose Sensor (DEXCOM G7 SENSOR) MISC Apply every 10 days. 06/01/23   Amin, Saad, MD  fluticasone (FLONASE) 50 MCG/ACT nasal spray USE 1 TO 2 SPRAYS IN EACH NOSTRIL ONCE DAILY AS NEEDED 03/04/24   Amin, Saad, MD  FREESTYLE LITE test strip USE TO CHECK FINGER STICK BLOOD SUGAR THREE TIMES A DAY 02/08/24   Tita Form, MD  furosemide  (LASIX ) 20 MG tablet Take 1 tablet (20 mg total) by mouth every other day. As needed 01/05/24    Hassan Links, MD  Glucagon  (GVOKE HYPOPEN  1-PACK) 0.5 MG/0.1ML SOAJ Inject 0.5 mg into the skin as needed. 09/04/23   Amin, Saad, MD  insulin  glargine (LANTUS  SOLOSTAR) 100 UNIT/ML Solostar Pen Inject 24 Units into the skin 2 (two) times daily. 03/15/24   Amin, Saad, MD  insulin  lispro (HUMALOG  KWIKPEN) 100 UNIT/ML KwikPen As per sliding scle that was given to patient, max daily units 25 12/31/23   Amin, Saad, MD  Insulin  Pen Needle (B-D ULTRAFINE III SHORT PEN) 31G X 8 MM MISC USE WITH LANTUS  PEN AS DIRECTED 12/16/23   Amin, Saad, MD  isosorbide  mononitrate (IMDUR ) 30 MG 24 hr tablet Take 1 tablet (30 mg total) by mouth daily. 05/05/23   Amin, Saad, MD  lactulose  (CHRONULAC ) 10 GM/15ML solution TAKE 15 ML ONCE DAILY 12/14/23   Albertina Hugger, MD  Microlet Lancets MISC USE AS DIRECTED 02/02/23   Tita Form, MD  neomycin -polymyxin-hydrocortisone  (CORTISPORIN) OTIC solution Apply 1 drop to both big toes  at painful corners after bath/shower once a day until pain is better 10/24/22   Tita Form, MD  nitroGLYCERIN  (NITROSTAT ) 0.4 MG SL tablet DISSOLVE 1 TABLET UNDER THE TONGUE EVERY 5 MINUTES AS NEEDED FOR CHEST PAIN 11/20/23   Hassan Links, MD  pantoprazole  (PROTONIX ) 40 MG tablet TAKE 1 TABLET IN THE MORNING 12/16/23   Amin, Saad, MD  rifaximin  (XIFAXAN ) 550 MG TABS tablet Take 1 tablet (550 mg total) by mouth 2 (two) times daily. 08/05/23 07/30/24  Fawn Hooks, NP  rosuvastatin  (CRESTOR ) 10 MG tablet Take 1 tablet (10 mg total) by mouth daily. 03/04/24   Amin, Saad, MD  sertraline  (ZOLOFT ) 25 MG tablet Take 1 tablet (25 mg total) by mouth daily. 05/05/23   Amin, Saad, MD  spironolactone  (ALDACTONE ) 25 MG tablet TAKE 1 TABLET DAILY 02/05/24   Amin, Saad, MD  triamcinolone  cream (KENALOG ) 0.1 % Apply 1 Application topically 2 (two) times daily. 11/20/23   Tita Form, MD     Family History  Problem Relation Age of Onset   Other Mother        died with old age   Stroke Father    Diabetes Father     Heart disease Father    Congenital heart disease Daughter    Colon cancer Neg Hx    Esophageal cancer Neg Hx    Pancreatic cancer Neg Hx    Stomach cancer Neg Hx     Social History   Socioeconomic History   Marital status: Married    Spouse name: Garment/textile technologist   Number of children: 2   Years of education: Not on file   Highest education level: Not on file  Occupational History   Occupation: retired  Tobacco Use   Smoking status: Never    Passive exposure: Past   Smokeless tobacco: Never  Vaping Use   Vaping status: Never Used  Substance and Sexual Activity   Alcohol use: Never   Drug use: Never   Sexual activity: Not on file  Other Topics Concern   Not on file  Social History Narrative   Not on file   Social Drivers of Health   Financial Resource Strain: Not on file  Food Insecurity: Not on file  Transportation Needs: Not on file  Physical Activity: Not on file  Stress: Not on file  Social Connections: Unknown (10/08/2022)   Received from [facility]   Social Network    Social Network: Not on file     Vital Signs: There were no vitals taken for this visit.  Physical Exam Patient is alert, oriented and able to participate fully in the conversation. No apparent discomfort or distress observed. He appears appropriately dressed.   Imaging: No results found.  Labs:  CBC: Recent Labs    11/18/23 1146  WBC 6.9  HGB 12.7*  HCT 37.1*  PLT 111*    COAGS: No results for input(s): "INR", "APTT" in the last 8760 hours.  BMP: Recent Labs    11/18/23 1146  NA 139  K 4.5  CL 103  CO2 19*  GLUCOSE 148*  BUN 24  CALCIUM  9.2  CREATININE 1.78*    LIVER FUNCTION TESTS: Recent Labs    11/18/23 1146  BILITOT 1.8*  AST 39  ALT 22  ALKPHOS 162*  PROT 6.5  ALBUMIN  3.4*    Assessment and Plan:  78 y.o. male with history of cryptogenic cirrhosis (Child Pugh B, MELD 16 pre TIPS --> 13 current) with portal  hypertension and recurrent ascites  in the setting of decreased diuresis limited by renal function.  He has recovered well status post TIPS creation on 03/17/22 which was uncomplicated and achieved a portosystemic mean pressure gradient reduction from 15 to 7 mmHg without post-deployment balloon dilation. Ascites formation has completely stopped.   He has been compliant with rifaximin  and is taking 40 mg lasix  every other day and spironolactone  daily. He was encouraged to continue taking his medications as directed but I did recommend that he reach out to his nephrologist (Dr. Zelda Hickman) to discuss his urinary issues. He may need his diuretics titrated or he may need further work up to assess for an enlarged prostate.   - Follow up with IR in 6 months or sooner if needed.   Electronically Signed: Fawn Hooks 04/01/2024, 3:36 PM   I spent a total of 25 Minutes in face to face in clinical consultation, greater than 50% of which was counseling/coordinating care for portal hypertension.

## 2024-04-05 ENCOUNTER — Other Ambulatory Visit: Payer: Self-pay

## 2024-04-05 MED ORDER — LANTUS SOLOSTAR 100 UNIT/ML ~~LOC~~ SOPN: 24.0000 [IU] | PEN_INJECTOR | Freq: Two times a day (BID) | SUBCUTANEOUS | 1 refills | Status: DC

## 2024-04-06 DIAGNOSIS — E119 Type 2 diabetes mellitus without complications: Secondary | ICD-10-CM

## 2024-04-06 DIAGNOSIS — I10 Essential (primary) hypertension: Secondary | ICD-10-CM

## 2024-04-12 ENCOUNTER — Ambulatory Visit (INDEPENDENT_AMBULATORY_CARE_PROVIDER_SITE_OTHER): Admitting: Podiatry

## 2024-04-12 DIAGNOSIS — M2142 Flat foot [pes planus] (acquired), left foot: Secondary | ICD-10-CM

## 2024-04-12 DIAGNOSIS — M2141 Flat foot [pes planus] (acquired), right foot: Secondary | ICD-10-CM | POA: Diagnosis not present

## 2024-04-12 DIAGNOSIS — M79675 Pain in left toe(s): Secondary | ICD-10-CM | POA: Diagnosis not present

## 2024-04-12 DIAGNOSIS — B351 Tinea unguium: Secondary | ICD-10-CM | POA: Diagnosis not present

## 2024-04-12 DIAGNOSIS — M2040 Other hammer toe(s) (acquired), unspecified foot: Secondary | ICD-10-CM

## 2024-04-12 DIAGNOSIS — E1140 Type 2 diabetes mellitus with diabetic neuropathy, unspecified: Secondary | ICD-10-CM | POA: Diagnosis not present

## 2024-04-12 DIAGNOSIS — M79674 Pain in right toe(s): Secondary | ICD-10-CM

## 2024-04-12 NOTE — Progress Notes (Signed)
  Subjective:  Patient ID: Matthew Ferguson, male    DOB: 09/13/1946,  MRN: [id number]  Chief Complaint  Patient presents with   [facility]    [facility], with out callous. Last A1c was 7.6 in Jan no anti coag.     78 y.o. male presents with the above complaint. History confirmed with patient. Patient presenting with pain related to dystrophic thickened elongated nails. Patient is unable to trim own nails related to nail dystrophy and/or mobility issues. Patient does have a history of T2DM.  Last recorded A1c 7.6.  Tenderness on palpation to bilateral first toenails medial borders.  Objective:  Physical Exam: warm, good capillary refill.  Pedal hair growth absent.  Pedal skin atrophic.  Dry, xerotic, flaky pedal skin. nail exam onychomycosis of the toenails, onycholysis, and dystrophic nails, some incurvation and tenderness to bilateral first toes medial borders. DP pulses palpable, PT pulses faintly palpable, and protective sensation absent, vibratory sensation absent Left Foot:  Pain with palpation of nails due to elongation and dystrophic growth.  Right Foot: Pain with palpation of nails due to elongation and dystrophic growth.  Bilateral pes planus foot type with muscle strength 4/5 in dorsiflexion, plantarflexion, inversion, eversion.  Presenting in wheelchair today. Assessment:   1. Pain due to onychomycosis of toenails of both feet   2. Type 2 diabetes mellitus with diabetic neuropathy, without long-term current use of insulin  [facility])     Plan:  Patient was evaluated and treated and all questions answered.   #Onychomycosis with pain  -Nails palliatively debrided as below. -Educated on self-care -Medial border great toenails bilaterally mild incurvation and tender, slant back nail trim today. Mupirocin and bandaids applied -Follow up as needed if ingrown borders recur.   Procedure: Nail Debridement Rationale: Pain Type of Debridement: manual, sharp debridement. Instrumentation: Nail nipper,  rotary burr. Number of Nails: 10  # Diabetes with neuropathy -Patient educated on diabetes. Discussed proper diabetic foot care and discussed risks and complications of disease. Educated patient in depth on reasons to return to the office immediately should he/she discover anything concerning or new on the feet. All questions answered. Discussed proper shoes as well.    Return in about 3 months (around 07/13/2024) for Diabetic Foot Care.         Eve Hinders, DPM [facility]

## 2024-04-27 DIAGNOSIS — R410 Disorientation, unspecified: Secondary | ICD-10-CM | POA: Diagnosis not present

## 2024-04-27 DIAGNOSIS — I2510 Atherosclerotic heart disease of native coronary artery without angina pectoris: Secondary | ICD-10-CM | POA: Diagnosis not present

## 2024-04-27 DIAGNOSIS — E785 Hyperlipidemia, unspecified: Secondary | ICD-10-CM | POA: Diagnosis not present

## 2024-04-27 DIAGNOSIS — Z7401 Bed confinement status: Secondary | ICD-10-CM | POA: Diagnosis not present

## 2024-04-27 DIAGNOSIS — E1122 Type 2 diabetes mellitus with diabetic chronic kidney disease: Secondary | ICD-10-CM | POA: Diagnosis not present

## 2024-04-27 DIAGNOSIS — M6259 Muscle wasting and atrophy, not elsewhere classified, multiple sites: Secondary | ICD-10-CM | POA: Diagnosis not present

## 2024-04-27 DIAGNOSIS — D696 Thrombocytopenia, unspecified: Secondary | ICD-10-CM | POA: Diagnosis not present

## 2024-04-27 DIAGNOSIS — E46 Unspecified protein-calorie malnutrition: Secondary | ICD-10-CM | POA: Diagnosis not present

## 2024-04-27 DIAGNOSIS — Z0389 Encounter for observation for other suspected diseases and conditions ruled out: Secondary | ICD-10-CM | POA: Diagnosis not present

## 2024-04-27 DIAGNOSIS — E872 Acidosis, unspecified: Secondary | ICD-10-CM | POA: Diagnosis not present

## 2024-04-27 DIAGNOSIS — R110 Nausea: Secondary | ICD-10-CM | POA: Diagnosis not present

## 2024-04-27 DIAGNOSIS — R531 Weakness: Secondary | ICD-10-CM | POA: Diagnosis not present

## 2024-04-27 DIAGNOSIS — D692 Other nonthrombocytopenic purpura: Secondary | ICD-10-CM | POA: Diagnosis not present

## 2024-04-27 DIAGNOSIS — D638 Anemia in other chronic diseases classified elsewhere: Secondary | ICD-10-CM | POA: Diagnosis not present

## 2024-04-27 DIAGNOSIS — R1111 Vomiting without nausea: Secondary | ICD-10-CM | POA: Diagnosis not present

## 2024-04-27 DIAGNOSIS — R41841 Cognitive communication deficit: Secondary | ICD-10-CM | POA: Diagnosis not present

## 2024-04-27 DIAGNOSIS — R609 Edema, unspecified: Secondary | ICD-10-CM | POA: Diagnosis not present

## 2024-04-27 DIAGNOSIS — N184 Chronic kidney disease, stage 4 (severe): Secondary | ICD-10-CM | POA: Diagnosis not present

## 2024-04-27 DIAGNOSIS — R112 Nausea with vomiting, unspecified: Secondary | ICD-10-CM | POA: Diagnosis not present

## 2024-04-27 DIAGNOSIS — I700 Atherosclerosis of aorta: Secondary | ICD-10-CM | POA: Diagnosis not present

## 2024-04-27 DIAGNOSIS — R5381 Other malaise: Secondary | ICD-10-CM | POA: Diagnosis not present

## 2024-04-27 DIAGNOSIS — E871 Hypo-osmolality and hyponatremia: Secondary | ICD-10-CM | POA: Diagnosis not present

## 2024-04-27 DIAGNOSIS — F0393 Unspecified dementia, unspecified severity, with mood disturbance: Secondary | ICD-10-CM | POA: Diagnosis not present

## 2024-04-27 DIAGNOSIS — B182 Chronic viral hepatitis C: Secondary | ICD-10-CM | POA: Diagnosis not present

## 2024-04-27 DIAGNOSIS — K7460 Unspecified cirrhosis of liver: Secondary | ICD-10-CM | POA: Diagnosis not present

## 2024-04-27 DIAGNOSIS — D631 Anemia in chronic kidney disease: Secondary | ICD-10-CM | POA: Diagnosis not present

## 2024-04-27 DIAGNOSIS — M109 Gout, unspecified: Secondary | ICD-10-CM | POA: Diagnosis not present

## 2024-04-27 DIAGNOSIS — K529 Noninfective gastroenteritis and colitis, unspecified: Secondary | ICD-10-CM | POA: Diagnosis not present

## 2024-04-27 DIAGNOSIS — R197 Diarrhea, unspecified: Secondary | ICD-10-CM | POA: Diagnosis not present

## 2024-04-27 DIAGNOSIS — E1129 Type 2 diabetes mellitus with other diabetic kidney complication: Secondary | ICD-10-CM | POA: Diagnosis not present

## 2024-04-27 DIAGNOSIS — Z794 Long term (current) use of insulin: Secondary | ICD-10-CM | POA: Diagnosis not present

## 2024-04-27 DIAGNOSIS — Z1152 Encounter for screening for COVID-19: Secondary | ICD-10-CM | POA: Diagnosis not present

## 2024-04-27 DIAGNOSIS — D509 Iron deficiency anemia, unspecified: Secondary | ICD-10-CM | POA: Diagnosis not present

## 2024-04-27 DIAGNOSIS — N179 Acute kidney failure, unspecified: Secondary | ICD-10-CM | POA: Diagnosis not present

## 2024-04-27 DIAGNOSIS — K219 Gastro-esophageal reflux disease without esophagitis: Secondary | ICD-10-CM | POA: Diagnosis not present

## 2024-04-27 DIAGNOSIS — Z79899 Other long term (current) drug therapy: Secondary | ICD-10-CM | POA: Diagnosis not present

## 2024-04-27 DIAGNOSIS — R4189 Other symptoms and signs involving cognitive functions and awareness: Secondary | ICD-10-CM | POA: Diagnosis not present

## 2024-04-27 DIAGNOSIS — Z6829 Body mass index (BMI) 29.0-29.9, adult: Secondary | ICD-10-CM | POA: Diagnosis not present

## 2024-04-27 DIAGNOSIS — R2681 Unsteadiness on feet: Secondary | ICD-10-CM | POA: Diagnosis not present

## 2024-04-27 DIAGNOSIS — M1990 Unspecified osteoarthritis, unspecified site: Secondary | ICD-10-CM | POA: Diagnosis not present

## 2024-04-27 DIAGNOSIS — I129 Hypertensive chronic kidney disease with stage 1 through stage 4 chronic kidney disease, or unspecified chronic kidney disease: Secondary | ICD-10-CM | POA: Diagnosis not present

## 2024-04-27 DIAGNOSIS — Z66 Do not resuscitate: Secondary | ICD-10-CM | POA: Diagnosis not present

## 2024-04-27 DIAGNOSIS — R1311 Dysphagia, oral phase: Secondary | ICD-10-CM | POA: Diagnosis not present

## 2024-04-27 DIAGNOSIS — A419 Sepsis, unspecified organism: Secondary | ICD-10-CM | POA: Diagnosis not present

## 2024-04-27 DIAGNOSIS — A0811 Acute gastroenteropathy due to Norwalk agent: Secondary | ICD-10-CM | POA: Diagnosis not present

## 2024-04-27 DIAGNOSIS — I252 Old myocardial infarction: Secondary | ICD-10-CM | POA: Diagnosis not present

## 2024-05-02 DIAGNOSIS — M6259 Muscle wasting and atrophy, not elsewhere classified, multiple sites: Secondary | ICD-10-CM | POA: Diagnosis not present

## 2024-05-02 DIAGNOSIS — A0811 Acute gastroenteropathy due to Norwalk agent: Secondary | ICD-10-CM | POA: Diagnosis not present

## 2024-05-02 DIAGNOSIS — D638 Anemia in other chronic diseases classified elsewhere: Secondary | ICD-10-CM | POA: Diagnosis not present

## 2024-05-02 DIAGNOSIS — R1311 Dysphagia, oral phase: Secondary | ICD-10-CM | POA: Diagnosis not present

## 2024-05-02 DIAGNOSIS — R4189 Other symptoms and signs involving cognitive functions and awareness: Secondary | ICD-10-CM | POA: Diagnosis not present

## 2024-05-02 DIAGNOSIS — K7460 Unspecified cirrhosis of liver: Secondary | ICD-10-CM | POA: Diagnosis not present

## 2024-05-02 DIAGNOSIS — R41841 Cognitive communication deficit: Secondary | ICD-10-CM | POA: Insufficient documentation

## 2024-05-02 DIAGNOSIS — R1111 Vomiting without nausea: Secondary | ICD-10-CM | POA: Diagnosis not present

## 2024-05-02 DIAGNOSIS — E46 Unspecified protein-calorie malnutrition: Secondary | ICD-10-CM | POA: Diagnosis not present

## 2024-05-02 DIAGNOSIS — E639 Nutritional deficiency, unspecified: Secondary | ICD-10-CM | POA: Insufficient documentation

## 2024-05-02 DIAGNOSIS — R5381 Other malaise: Secondary | ICD-10-CM | POA: Insufficient documentation

## 2024-05-02 DIAGNOSIS — N184 Chronic kidney disease, stage 4 (severe): Secondary | ICD-10-CM | POA: Diagnosis not present

## 2024-05-02 DIAGNOSIS — I700 Atherosclerosis of aorta: Secondary | ICD-10-CM | POA: Diagnosis not present

## 2024-05-02 DIAGNOSIS — D692 Other nonthrombocytopenic purpura: Secondary | ICD-10-CM | POA: Diagnosis not present

## 2024-05-02 DIAGNOSIS — K769 Liver disease, unspecified: Secondary | ICD-10-CM | POA: Insufficient documentation

## 2024-05-02 DIAGNOSIS — E1129 Type 2 diabetes mellitus with other diabetic kidney complication: Secondary | ICD-10-CM | POA: Diagnosis not present

## 2024-05-02 DIAGNOSIS — N179 Acute kidney failure, unspecified: Secondary | ICD-10-CM | POA: Diagnosis not present

## 2024-05-02 DIAGNOSIS — R609 Edema, unspecified: Secondary | ICD-10-CM | POA: Insufficient documentation

## 2024-05-02 DIAGNOSIS — R2681 Unsteadiness on feet: Secondary | ICD-10-CM | POA: Insufficient documentation

## 2024-05-02 DIAGNOSIS — Z7401 Bed confinement status: Secondary | ICD-10-CM | POA: Diagnosis not present

## 2024-05-02 DIAGNOSIS — E871 Hypo-osmolality and hyponatremia: Secondary | ICD-10-CM | POA: Diagnosis not present

## 2024-05-02 DIAGNOSIS — E785 Hyperlipidemia, unspecified: Secondary | ICD-10-CM | POA: Diagnosis not present

## 2024-05-02 DIAGNOSIS — I2510 Atherosclerotic heart disease of native coronary artery without angina pectoris: Secondary | ICD-10-CM | POA: Insufficient documentation

## 2024-05-02 DIAGNOSIS — M6250 Muscle wasting and atrophy, not elsewhere classified, unspecified site: Secondary | ICD-10-CM | POA: Diagnosis not present

## 2024-05-02 DIAGNOSIS — R197 Diarrhea, unspecified: Secondary | ICD-10-CM | POA: Diagnosis not present

## 2024-05-02 DIAGNOSIS — Z794 Long term (current) use of insulin: Secondary | ICD-10-CM | POA: Diagnosis not present

## 2024-05-02 DIAGNOSIS — R410 Disorientation, unspecified: Secondary | ICD-10-CM | POA: Diagnosis not present

## 2024-05-02 DIAGNOSIS — R531 Weakness: Secondary | ICD-10-CM | POA: Diagnosis not present

## 2024-05-02 DIAGNOSIS — B182 Chronic viral hepatitis C: Secondary | ICD-10-CM | POA: Diagnosis not present

## 2024-05-03 DIAGNOSIS — M6250 Muscle wasting and atrophy, not elsewhere classified, unspecified site: Secondary | ICD-10-CM | POA: Diagnosis not present

## 2024-05-03 DIAGNOSIS — B182 Chronic viral hepatitis C: Secondary | ICD-10-CM | POA: Diagnosis not present

## 2024-05-03 DIAGNOSIS — K7460 Unspecified cirrhosis of liver: Secondary | ICD-10-CM | POA: Diagnosis not present

## 2024-05-03 DIAGNOSIS — R5381 Other malaise: Secondary | ICD-10-CM | POA: Diagnosis not present

## 2024-05-04 DIAGNOSIS — R609 Edema, unspecified: Secondary | ICD-10-CM | POA: Diagnosis not present

## 2024-05-11 ENCOUNTER — Ambulatory Visit: Admitting: Internal Medicine

## 2024-05-16 DIAGNOSIS — N184 Chronic kidney disease, stage 4 (severe): Secondary | ICD-10-CM | POA: Diagnosis not present

## 2024-05-16 DIAGNOSIS — B182 Chronic viral hepatitis C: Secondary | ICD-10-CM | POA: Diagnosis not present

## 2024-05-16 DIAGNOSIS — R5381 Other malaise: Secondary | ICD-10-CM | POA: Diagnosis not present

## 2024-05-16 DIAGNOSIS — K7460 Unspecified cirrhosis of liver: Secondary | ICD-10-CM | POA: Diagnosis not present

## 2024-05-17 DIAGNOSIS — M6250 Muscle wasting and atrophy, not elsewhere classified, unspecified site: Secondary | ICD-10-CM | POA: Insufficient documentation

## 2024-05-20 DIAGNOSIS — R5381 Other malaise: Secondary | ICD-10-CM | POA: Diagnosis not present

## 2024-05-20 DIAGNOSIS — E1129 Type 2 diabetes mellitus with other diabetic kidney complication: Secondary | ICD-10-CM | POA: Diagnosis not present

## 2024-05-20 DIAGNOSIS — R4189 Other symptoms and signs involving cognitive functions and awareness: Secondary | ICD-10-CM | POA: Diagnosis not present

## 2024-05-20 DIAGNOSIS — Z794 Long term (current) use of insulin: Secondary | ICD-10-CM | POA: Diagnosis not present

## 2024-05-30 DIAGNOSIS — R609 Edema, unspecified: Secondary | ICD-10-CM | POA: Diagnosis not present

## 2024-05-30 DIAGNOSIS — R5381 Other malaise: Secondary | ICD-10-CM | POA: Diagnosis not present

## 2024-05-30 DIAGNOSIS — E46 Unspecified protein-calorie malnutrition: Secondary | ICD-10-CM | POA: Diagnosis not present

## 2024-05-30 DIAGNOSIS — R1311 Dysphagia, oral phase: Secondary | ICD-10-CM | POA: Diagnosis not present

## 2024-05-30 DIAGNOSIS — D631 Anemia in chronic kidney disease: Secondary | ICD-10-CM | POA: Diagnosis not present

## 2024-05-30 DIAGNOSIS — E785 Hyperlipidemia, unspecified: Secondary | ICD-10-CM | POA: Diagnosis not present

## 2024-05-30 DIAGNOSIS — K7460 Unspecified cirrhosis of liver: Secondary | ICD-10-CM | POA: Diagnosis not present

## 2024-05-30 DIAGNOSIS — B182 Chronic viral hepatitis C: Secondary | ICD-10-CM | POA: Diagnosis not present

## 2024-05-30 DIAGNOSIS — I700 Atherosclerosis of aorta: Secondary | ICD-10-CM | POA: Diagnosis not present

## 2024-05-30 DIAGNOSIS — E1122 Type 2 diabetes mellitus with diabetic chronic kidney disease: Secondary | ICD-10-CM | POA: Diagnosis not present

## 2024-05-30 DIAGNOSIS — Z9181 History of falling: Secondary | ICD-10-CM | POA: Diagnosis not present

## 2024-05-30 DIAGNOSIS — M6259 Muscle wasting and atrophy, not elsewhere classified, multiple sites: Secondary | ICD-10-CM | POA: Diagnosis not present

## 2024-05-30 DIAGNOSIS — I2510 Atherosclerotic heart disease of native coronary artery without angina pectoris: Secondary | ICD-10-CM | POA: Diagnosis not present

## 2024-05-30 DIAGNOSIS — Z794 Long term (current) use of insulin: Secondary | ICD-10-CM | POA: Diagnosis not present

## 2024-05-30 DIAGNOSIS — N184 Chronic kidney disease, stage 4 (severe): Secondary | ICD-10-CM | POA: Diagnosis not present

## 2024-05-30 DIAGNOSIS — R4189 Other symptoms and signs involving cognitive functions and awareness: Secondary | ICD-10-CM | POA: Diagnosis not present

## 2024-05-30 DIAGNOSIS — A0811 Acute gastroenteropathy due to Norwalk agent: Secondary | ICD-10-CM | POA: Diagnosis not present

## 2024-05-30 DIAGNOSIS — D692 Other nonthrombocytopenic purpura: Secondary | ICD-10-CM | POA: Diagnosis not present

## 2024-06-01 DIAGNOSIS — E1122 Type 2 diabetes mellitus with diabetic chronic kidney disease: Secondary | ICD-10-CM | POA: Diagnosis not present

## 2024-06-01 DIAGNOSIS — A0811 Acute gastroenteropathy due to Norwalk agent: Secondary | ICD-10-CM | POA: Diagnosis not present

## 2024-06-01 DIAGNOSIS — D631 Anemia in chronic kidney disease: Secondary | ICD-10-CM | POA: Diagnosis not present

## 2024-06-01 DIAGNOSIS — N184 Chronic kidney disease, stage 4 (severe): Secondary | ICD-10-CM | POA: Diagnosis not present

## 2024-06-01 DIAGNOSIS — M6259 Muscle wasting and atrophy, not elsewhere classified, multiple sites: Secondary | ICD-10-CM | POA: Diagnosis not present

## 2024-06-01 DIAGNOSIS — E46 Unspecified protein-calorie malnutrition: Secondary | ICD-10-CM | POA: Diagnosis not present

## 2024-06-03 DIAGNOSIS — A0811 Acute gastroenteropathy due to Norwalk agent: Secondary | ICD-10-CM | POA: Diagnosis not present

## 2024-06-03 DIAGNOSIS — D631 Anemia in chronic kidney disease: Secondary | ICD-10-CM | POA: Diagnosis not present

## 2024-06-03 DIAGNOSIS — E46 Unspecified protein-calorie malnutrition: Secondary | ICD-10-CM | POA: Diagnosis not present

## 2024-06-03 DIAGNOSIS — E1122 Type 2 diabetes mellitus with diabetic chronic kidney disease: Secondary | ICD-10-CM | POA: Diagnosis not present

## 2024-06-03 DIAGNOSIS — N184 Chronic kidney disease, stage 4 (severe): Secondary | ICD-10-CM | POA: Diagnosis not present

## 2024-06-03 DIAGNOSIS — M6259 Muscle wasting and atrophy, not elsewhere classified, multiple sites: Secondary | ICD-10-CM | POA: Diagnosis not present

## 2024-06-06 DIAGNOSIS — E1122 Type 2 diabetes mellitus with diabetic chronic kidney disease: Secondary | ICD-10-CM | POA: Diagnosis not present

## 2024-06-06 DIAGNOSIS — D631 Anemia in chronic kidney disease: Secondary | ICD-10-CM | POA: Diagnosis not present

## 2024-06-06 DIAGNOSIS — A0811 Acute gastroenteropathy due to Norwalk agent: Secondary | ICD-10-CM | POA: Diagnosis not present

## 2024-06-06 DIAGNOSIS — E46 Unspecified protein-calorie malnutrition: Secondary | ICD-10-CM | POA: Diagnosis not present

## 2024-06-06 DIAGNOSIS — M6259 Muscle wasting and atrophy, not elsewhere classified, multiple sites: Secondary | ICD-10-CM | POA: Diagnosis not present

## 2024-06-06 DIAGNOSIS — N184 Chronic kidney disease, stage 4 (severe): Secondary | ICD-10-CM | POA: Diagnosis not present

## 2024-06-07 DIAGNOSIS — M6259 Muscle wasting and atrophy, not elsewhere classified, multiple sites: Secondary | ICD-10-CM | POA: Diagnosis not present

## 2024-06-07 DIAGNOSIS — E1122 Type 2 diabetes mellitus with diabetic chronic kidney disease: Secondary | ICD-10-CM | POA: Diagnosis not present

## 2024-06-07 DIAGNOSIS — N184 Chronic kidney disease, stage 4 (severe): Secondary | ICD-10-CM | POA: Diagnosis not present

## 2024-06-07 DIAGNOSIS — E46 Unspecified protein-calorie malnutrition: Secondary | ICD-10-CM | POA: Diagnosis not present

## 2024-06-07 DIAGNOSIS — A0811 Acute gastroenteropathy due to Norwalk agent: Secondary | ICD-10-CM | POA: Diagnosis not present

## 2024-06-07 DIAGNOSIS — D631 Anemia in chronic kidney disease: Secondary | ICD-10-CM | POA: Diagnosis not present

## 2024-06-09 DIAGNOSIS — M6259 Muscle wasting and atrophy, not elsewhere classified, multiple sites: Secondary | ICD-10-CM | POA: Diagnosis not present

## 2024-06-09 DIAGNOSIS — A0811 Acute gastroenteropathy due to Norwalk agent: Secondary | ICD-10-CM | POA: Diagnosis not present

## 2024-06-09 DIAGNOSIS — E1122 Type 2 diabetes mellitus with diabetic chronic kidney disease: Secondary | ICD-10-CM | POA: Diagnosis not present

## 2024-06-09 DIAGNOSIS — N184 Chronic kidney disease, stage 4 (severe): Secondary | ICD-10-CM | POA: Diagnosis not present

## 2024-06-09 DIAGNOSIS — E46 Unspecified protein-calorie malnutrition: Secondary | ICD-10-CM | POA: Diagnosis not present

## 2024-06-09 DIAGNOSIS — D631 Anemia in chronic kidney disease: Secondary | ICD-10-CM | POA: Diagnosis not present

## 2024-06-13 DIAGNOSIS — D631 Anemia in chronic kidney disease: Secondary | ICD-10-CM | POA: Diagnosis not present

## 2024-06-13 DIAGNOSIS — E1122 Type 2 diabetes mellitus with diabetic chronic kidney disease: Secondary | ICD-10-CM | POA: Diagnosis not present

## 2024-06-13 DIAGNOSIS — A0811 Acute gastroenteropathy due to Norwalk agent: Secondary | ICD-10-CM | POA: Diagnosis not present

## 2024-06-13 DIAGNOSIS — M6259 Muscle wasting and atrophy, not elsewhere classified, multiple sites: Secondary | ICD-10-CM | POA: Diagnosis not present

## 2024-06-13 DIAGNOSIS — N184 Chronic kidney disease, stage 4 (severe): Secondary | ICD-10-CM | POA: Diagnosis not present

## 2024-06-13 DIAGNOSIS — E46 Unspecified protein-calorie malnutrition: Secondary | ICD-10-CM | POA: Diagnosis not present

## 2024-06-15 DIAGNOSIS — M6259 Muscle wasting and atrophy, not elsewhere classified, multiple sites: Secondary | ICD-10-CM | POA: Diagnosis not present

## 2024-06-15 DIAGNOSIS — E46 Unspecified protein-calorie malnutrition: Secondary | ICD-10-CM | POA: Diagnosis not present

## 2024-06-15 DIAGNOSIS — N184 Chronic kidney disease, stage 4 (severe): Secondary | ICD-10-CM | POA: Diagnosis not present

## 2024-06-15 DIAGNOSIS — E1122 Type 2 diabetes mellitus with diabetic chronic kidney disease: Secondary | ICD-10-CM | POA: Diagnosis not present

## 2024-06-15 DIAGNOSIS — D631 Anemia in chronic kidney disease: Secondary | ICD-10-CM | POA: Diagnosis not present

## 2024-06-15 DIAGNOSIS — A0811 Acute gastroenteropathy due to Norwalk agent: Secondary | ICD-10-CM | POA: Diagnosis not present

## 2024-06-20 DIAGNOSIS — D631 Anemia in chronic kidney disease: Secondary | ICD-10-CM | POA: Diagnosis not present

## 2024-06-20 DIAGNOSIS — N184 Chronic kidney disease, stage 4 (severe): Secondary | ICD-10-CM | POA: Diagnosis not present

## 2024-06-20 DIAGNOSIS — E46 Unspecified protein-calorie malnutrition: Secondary | ICD-10-CM | POA: Diagnosis not present

## 2024-06-20 DIAGNOSIS — M6259 Muscle wasting and atrophy, not elsewhere classified, multiple sites: Secondary | ICD-10-CM | POA: Diagnosis not present

## 2024-06-20 DIAGNOSIS — E1122 Type 2 diabetes mellitus with diabetic chronic kidney disease: Secondary | ICD-10-CM | POA: Diagnosis not present

## 2024-06-20 DIAGNOSIS — A0811 Acute gastroenteropathy due to Norwalk agent: Secondary | ICD-10-CM | POA: Diagnosis not present

## 2024-06-21 DIAGNOSIS — M6259 Muscle wasting and atrophy, not elsewhere classified, multiple sites: Secondary | ICD-10-CM | POA: Diagnosis not present

## 2024-06-21 DIAGNOSIS — E46 Unspecified protein-calorie malnutrition: Secondary | ICD-10-CM | POA: Diagnosis not present

## 2024-06-21 DIAGNOSIS — D631 Anemia in chronic kidney disease: Secondary | ICD-10-CM | POA: Diagnosis not present

## 2024-06-21 DIAGNOSIS — E1122 Type 2 diabetes mellitus with diabetic chronic kidney disease: Secondary | ICD-10-CM | POA: Diagnosis not present

## 2024-06-21 DIAGNOSIS — N184 Chronic kidney disease, stage 4 (severe): Secondary | ICD-10-CM | POA: Diagnosis not present

## 2024-06-21 DIAGNOSIS — A0811 Acute gastroenteropathy due to Norwalk agent: Secondary | ICD-10-CM | POA: Diagnosis not present

## 2024-06-22 DIAGNOSIS — D631 Anemia in chronic kidney disease: Secondary | ICD-10-CM | POA: Diagnosis not present

## 2024-06-22 DIAGNOSIS — E46 Unspecified protein-calorie malnutrition: Secondary | ICD-10-CM | POA: Diagnosis not present

## 2024-06-22 DIAGNOSIS — E1122 Type 2 diabetes mellitus with diabetic chronic kidney disease: Secondary | ICD-10-CM | POA: Diagnosis not present

## 2024-06-22 DIAGNOSIS — M6259 Muscle wasting and atrophy, not elsewhere classified, multiple sites: Secondary | ICD-10-CM | POA: Diagnosis not present

## 2024-06-22 DIAGNOSIS — N184 Chronic kidney disease, stage 4 (severe): Secondary | ICD-10-CM | POA: Diagnosis not present

## 2024-06-22 DIAGNOSIS — A0811 Acute gastroenteropathy due to Norwalk agent: Secondary | ICD-10-CM | POA: Diagnosis not present

## 2024-06-27 DIAGNOSIS — E46 Unspecified protein-calorie malnutrition: Secondary | ICD-10-CM | POA: Diagnosis not present

## 2024-06-27 DIAGNOSIS — N184 Chronic kidney disease, stage 4 (severe): Secondary | ICD-10-CM | POA: Diagnosis not present

## 2024-06-27 DIAGNOSIS — A0811 Acute gastroenteropathy due to Norwalk agent: Secondary | ICD-10-CM | POA: Diagnosis not present

## 2024-06-27 DIAGNOSIS — M6259 Muscle wasting and atrophy, not elsewhere classified, multiple sites: Secondary | ICD-10-CM | POA: Diagnosis not present

## 2024-06-27 DIAGNOSIS — E1122 Type 2 diabetes mellitus with diabetic chronic kidney disease: Secondary | ICD-10-CM | POA: Diagnosis not present

## 2024-06-27 DIAGNOSIS — D631 Anemia in chronic kidney disease: Secondary | ICD-10-CM | POA: Diagnosis not present

## 2024-06-29 DIAGNOSIS — B182 Chronic viral hepatitis C: Secondary | ICD-10-CM | POA: Diagnosis not present

## 2024-06-29 DIAGNOSIS — E785 Hyperlipidemia, unspecified: Secondary | ICD-10-CM | POA: Diagnosis not present

## 2024-06-29 DIAGNOSIS — Z794 Long term (current) use of insulin: Secondary | ICD-10-CM | POA: Diagnosis not present

## 2024-06-29 DIAGNOSIS — R5381 Other malaise: Secondary | ICD-10-CM | POA: Diagnosis not present

## 2024-06-29 DIAGNOSIS — K7460 Unspecified cirrhosis of liver: Secondary | ICD-10-CM | POA: Diagnosis not present

## 2024-06-29 DIAGNOSIS — D631 Anemia in chronic kidney disease: Secondary | ICD-10-CM | POA: Diagnosis not present

## 2024-06-29 DIAGNOSIS — I700 Atherosclerosis of aorta: Secondary | ICD-10-CM | POA: Diagnosis not present

## 2024-06-29 DIAGNOSIS — R609 Edema, unspecified: Secondary | ICD-10-CM | POA: Diagnosis not present

## 2024-06-29 DIAGNOSIS — M6259 Muscle wasting and atrophy, not elsewhere classified, multiple sites: Secondary | ICD-10-CM | POA: Diagnosis not present

## 2024-06-29 DIAGNOSIS — R4189 Other symptoms and signs involving cognitive functions and awareness: Secondary | ICD-10-CM | POA: Diagnosis not present

## 2024-06-29 DIAGNOSIS — E1122 Type 2 diabetes mellitus with diabetic chronic kidney disease: Secondary | ICD-10-CM | POA: Diagnosis not present

## 2024-06-29 DIAGNOSIS — I2510 Atherosclerotic heart disease of native coronary artery without angina pectoris: Secondary | ICD-10-CM | POA: Diagnosis not present

## 2024-06-29 DIAGNOSIS — R1311 Dysphagia, oral phase: Secondary | ICD-10-CM | POA: Diagnosis not present

## 2024-06-29 DIAGNOSIS — N184 Chronic kidney disease, stage 4 (severe): Secondary | ICD-10-CM | POA: Diagnosis not present

## 2024-06-29 DIAGNOSIS — A0811 Acute gastroenteropathy due to Norwalk agent: Secondary | ICD-10-CM | POA: Diagnosis not present

## 2024-06-29 DIAGNOSIS — D692 Other nonthrombocytopenic purpura: Secondary | ICD-10-CM | POA: Diagnosis not present

## 2024-06-29 DIAGNOSIS — E46 Unspecified protein-calorie malnutrition: Secondary | ICD-10-CM | POA: Diagnosis not present

## 2024-06-29 DIAGNOSIS — Z9181 History of falling: Secondary | ICD-10-CM | POA: Diagnosis not present

## 2024-07-07 DIAGNOSIS — N184 Chronic kidney disease, stage 4 (severe): Secondary | ICD-10-CM | POA: Diagnosis not present

## 2024-07-07 DIAGNOSIS — E46 Unspecified protein-calorie malnutrition: Secondary | ICD-10-CM | POA: Diagnosis not present

## 2024-07-07 DIAGNOSIS — M6259 Muscle wasting and atrophy, not elsewhere classified, multiple sites: Secondary | ICD-10-CM | POA: Diagnosis not present

## 2024-07-07 DIAGNOSIS — D631 Anemia in chronic kidney disease: Secondary | ICD-10-CM | POA: Diagnosis not present

## 2024-07-07 DIAGNOSIS — E1122 Type 2 diabetes mellitus with diabetic chronic kidney disease: Secondary | ICD-10-CM | POA: Diagnosis not present

## 2024-07-07 DIAGNOSIS — A0811 Acute gastroenteropathy due to Norwalk agent: Secondary | ICD-10-CM | POA: Diagnosis not present

## 2024-07-11 DIAGNOSIS — N184 Chronic kidney disease, stage 4 (severe): Secondary | ICD-10-CM | POA: Diagnosis not present

## 2024-07-11 DIAGNOSIS — N1832 Chronic kidney disease, stage 3b: Secondary | ICD-10-CM | POA: Diagnosis not present

## 2024-07-11 DIAGNOSIS — E1122 Type 2 diabetes mellitus with diabetic chronic kidney disease: Secondary | ICD-10-CM | POA: Diagnosis not present

## 2024-07-11 DIAGNOSIS — D631 Anemia in chronic kidney disease: Secondary | ICD-10-CM | POA: Diagnosis not present

## 2024-07-11 DIAGNOSIS — M6259 Muscle wasting and atrophy, not elsewhere classified, multiple sites: Secondary | ICD-10-CM | POA: Diagnosis not present

## 2024-07-11 DIAGNOSIS — E46 Unspecified protein-calorie malnutrition: Secondary | ICD-10-CM | POA: Diagnosis not present

## 2024-07-11 DIAGNOSIS — A0811 Acute gastroenteropathy due to Norwalk agent: Secondary | ICD-10-CM | POA: Diagnosis not present

## 2024-07-12 ENCOUNTER — Ambulatory Visit: Admitting: Podiatry

## 2024-07-18 DIAGNOSIS — N184 Chronic kidney disease, stage 4 (severe): Secondary | ICD-10-CM | POA: Diagnosis not present

## 2024-07-18 DIAGNOSIS — E1122 Type 2 diabetes mellitus with diabetic chronic kidney disease: Secondary | ICD-10-CM | POA: Diagnosis not present

## 2024-07-18 DIAGNOSIS — M6259 Muscle wasting and atrophy, not elsewhere classified, multiple sites: Secondary | ICD-10-CM | POA: Diagnosis not present

## 2024-07-18 DIAGNOSIS — A0811 Acute gastroenteropathy due to Norwalk agent: Secondary | ICD-10-CM | POA: Diagnosis not present

## 2024-07-18 DIAGNOSIS — E46 Unspecified protein-calorie malnutrition: Secondary | ICD-10-CM | POA: Diagnosis not present

## 2024-07-18 DIAGNOSIS — D631 Anemia in chronic kidney disease: Secondary | ICD-10-CM | POA: Diagnosis not present

## 2024-07-20 DIAGNOSIS — E46 Unspecified protein-calorie malnutrition: Secondary | ICD-10-CM | POA: Diagnosis not present

## 2024-07-20 DIAGNOSIS — A0811 Acute gastroenteropathy due to Norwalk agent: Secondary | ICD-10-CM | POA: Diagnosis not present

## 2024-07-20 DIAGNOSIS — N184 Chronic kidney disease, stage 4 (severe): Secondary | ICD-10-CM | POA: Diagnosis not present

## 2024-07-20 DIAGNOSIS — E1122 Type 2 diabetes mellitus with diabetic chronic kidney disease: Secondary | ICD-10-CM | POA: Diagnosis not present

## 2024-07-20 DIAGNOSIS — D631 Anemia in chronic kidney disease: Secondary | ICD-10-CM | POA: Diagnosis not present

## 2024-07-20 DIAGNOSIS — M6259 Muscle wasting and atrophy, not elsewhere classified, multiple sites: Secondary | ICD-10-CM | POA: Diagnosis not present

## 2024-07-21 DIAGNOSIS — N1832 Chronic kidney disease, stage 3b: Secondary | ICD-10-CM | POA: Diagnosis not present

## 2024-07-21 DIAGNOSIS — E1122 Type 2 diabetes mellitus with diabetic chronic kidney disease: Secondary | ICD-10-CM | POA: Diagnosis not present

## 2024-07-21 DIAGNOSIS — D631 Anemia in chronic kidney disease: Secondary | ICD-10-CM | POA: Diagnosis not present

## 2024-07-21 DIAGNOSIS — K5900 Constipation, unspecified: Secondary | ICD-10-CM | POA: Diagnosis not present

## 2024-07-21 DIAGNOSIS — N2581 Secondary hyperparathyroidism of renal origin: Secondary | ICD-10-CM | POA: Diagnosis not present

## 2024-07-21 DIAGNOSIS — R609 Edema, unspecified: Secondary | ICD-10-CM | POA: Diagnosis not present

## 2024-07-21 DIAGNOSIS — R319 Hematuria, unspecified: Secondary | ICD-10-CM | POA: Diagnosis not present

## 2024-07-21 DIAGNOSIS — R350 Frequency of micturition: Secondary | ICD-10-CM | POA: Diagnosis not present

## 2024-07-21 DIAGNOSIS — I129 Hypertensive chronic kidney disease with stage 1 through stage 4 chronic kidney disease, or unspecified chronic kidney disease: Secondary | ICD-10-CM | POA: Diagnosis not present

## 2024-07-21 DIAGNOSIS — K7460 Unspecified cirrhosis of liver: Secondary | ICD-10-CM | POA: Diagnosis not present

## 2024-07-26 DIAGNOSIS — E46 Unspecified protein-calorie malnutrition: Secondary | ICD-10-CM | POA: Diagnosis not present

## 2024-07-26 DIAGNOSIS — E1122 Type 2 diabetes mellitus with diabetic chronic kidney disease: Secondary | ICD-10-CM | POA: Diagnosis not present

## 2024-07-26 DIAGNOSIS — M6259 Muscle wasting and atrophy, not elsewhere classified, multiple sites: Secondary | ICD-10-CM | POA: Diagnosis not present

## 2024-07-26 DIAGNOSIS — N184 Chronic kidney disease, stage 4 (severe): Secondary | ICD-10-CM | POA: Diagnosis not present

## 2024-07-26 DIAGNOSIS — D631 Anemia in chronic kidney disease: Secondary | ICD-10-CM | POA: Diagnosis not present

## 2024-07-26 DIAGNOSIS — A0811 Acute gastroenteropathy due to Norwalk agent: Secondary | ICD-10-CM | POA: Diagnosis not present

## 2024-07-27 DIAGNOSIS — A0811 Acute gastroenteropathy due to Norwalk agent: Secondary | ICD-10-CM | POA: Diagnosis not present

## 2024-07-27 DIAGNOSIS — D631 Anemia in chronic kidney disease: Secondary | ICD-10-CM | POA: Diagnosis not present

## 2024-07-27 DIAGNOSIS — M6259 Muscle wasting and atrophy, not elsewhere classified, multiple sites: Secondary | ICD-10-CM | POA: Diagnosis not present

## 2024-07-27 DIAGNOSIS — E1122 Type 2 diabetes mellitus with diabetic chronic kidney disease: Secondary | ICD-10-CM | POA: Diagnosis not present

## 2024-07-27 DIAGNOSIS — E46 Unspecified protein-calorie malnutrition: Secondary | ICD-10-CM | POA: Diagnosis not present

## 2024-07-27 DIAGNOSIS — N184 Chronic kidney disease, stage 4 (severe): Secondary | ICD-10-CM | POA: Diagnosis not present

## 2024-07-29 DIAGNOSIS — E1122 Type 2 diabetes mellitus with diabetic chronic kidney disease: Secondary | ICD-10-CM | POA: Diagnosis not present

## 2024-07-29 DIAGNOSIS — E785 Hyperlipidemia, unspecified: Secondary | ICD-10-CM | POA: Diagnosis not present

## 2024-07-29 DIAGNOSIS — E46 Unspecified protein-calorie malnutrition: Secondary | ICD-10-CM | POA: Diagnosis not present

## 2024-07-29 DIAGNOSIS — D631 Anemia in chronic kidney disease: Secondary | ICD-10-CM | POA: Diagnosis not present

## 2024-07-29 DIAGNOSIS — R1311 Dysphagia, oral phase: Secondary | ICD-10-CM | POA: Diagnosis not present

## 2024-07-29 DIAGNOSIS — R5381 Other malaise: Secondary | ICD-10-CM | POA: Diagnosis not present

## 2024-07-29 DIAGNOSIS — Z9181 History of falling: Secondary | ICD-10-CM | POA: Diagnosis not present

## 2024-07-29 DIAGNOSIS — I700 Atherosclerosis of aorta: Secondary | ICD-10-CM | POA: Diagnosis not present

## 2024-07-29 DIAGNOSIS — I129 Hypertensive chronic kidney disease with stage 1 through stage 4 chronic kidney disease, or unspecified chronic kidney disease: Secondary | ICD-10-CM | POA: Diagnosis not present

## 2024-07-29 DIAGNOSIS — D692 Other nonthrombocytopenic purpura: Secondary | ICD-10-CM | POA: Diagnosis not present

## 2024-07-29 DIAGNOSIS — A0811 Acute gastroenteropathy due to Norwalk agent: Secondary | ICD-10-CM | POA: Diagnosis not present

## 2024-07-29 DIAGNOSIS — I2510 Atherosclerotic heart disease of native coronary artery without angina pectoris: Secondary | ICD-10-CM | POA: Diagnosis not present

## 2024-07-29 DIAGNOSIS — Z794 Long term (current) use of insulin: Secondary | ICD-10-CM | POA: Diagnosis not present

## 2024-07-29 DIAGNOSIS — M6259 Muscle wasting and atrophy, not elsewhere classified, multiple sites: Secondary | ICD-10-CM | POA: Diagnosis not present

## 2024-07-29 DIAGNOSIS — K7460 Unspecified cirrhosis of liver: Secondary | ICD-10-CM | POA: Diagnosis not present

## 2024-07-29 DIAGNOSIS — N184 Chronic kidney disease, stage 4 (severe): Secondary | ICD-10-CM | POA: Diagnosis not present

## 2024-07-29 DIAGNOSIS — B182 Chronic viral hepatitis C: Secondary | ICD-10-CM | POA: Diagnosis not present

## 2024-07-29 DIAGNOSIS — R609 Edema, unspecified: Secondary | ICD-10-CM | POA: Diagnosis not present

## 2024-08-02 DIAGNOSIS — N184 Chronic kidney disease, stage 4 (severe): Secondary | ICD-10-CM | POA: Diagnosis not present

## 2024-08-02 DIAGNOSIS — D631 Anemia in chronic kidney disease: Secondary | ICD-10-CM | POA: Diagnosis not present

## 2024-08-02 DIAGNOSIS — M6259 Muscle wasting and atrophy, not elsewhere classified, multiple sites: Secondary | ICD-10-CM | POA: Diagnosis not present

## 2024-08-02 DIAGNOSIS — I129 Hypertensive chronic kidney disease with stage 1 through stage 4 chronic kidney disease, or unspecified chronic kidney disease: Secondary | ICD-10-CM | POA: Diagnosis not present

## 2024-08-02 DIAGNOSIS — A0811 Acute gastroenteropathy due to Norwalk agent: Secondary | ICD-10-CM | POA: Diagnosis not present

## 2024-08-02 DIAGNOSIS — E1122 Type 2 diabetes mellitus with diabetic chronic kidney disease: Secondary | ICD-10-CM | POA: Diagnosis not present

## 2024-08-04 DIAGNOSIS — M6259 Muscle wasting and atrophy, not elsewhere classified, multiple sites: Secondary | ICD-10-CM | POA: Diagnosis not present

## 2024-08-04 DIAGNOSIS — D631 Anemia in chronic kidney disease: Secondary | ICD-10-CM | POA: Diagnosis not present

## 2024-08-04 DIAGNOSIS — I129 Hypertensive chronic kidney disease with stage 1 through stage 4 chronic kidney disease, or unspecified chronic kidney disease: Secondary | ICD-10-CM | POA: Diagnosis not present

## 2024-08-04 DIAGNOSIS — N184 Chronic kidney disease, stage 4 (severe): Secondary | ICD-10-CM | POA: Diagnosis not present

## 2024-08-04 DIAGNOSIS — E1122 Type 2 diabetes mellitus with diabetic chronic kidney disease: Secondary | ICD-10-CM | POA: Diagnosis not present

## 2024-08-04 DIAGNOSIS — A0811 Acute gastroenteropathy due to Norwalk agent: Secondary | ICD-10-CM | POA: Diagnosis not present

## 2024-08-04 NOTE — Progress Notes (Signed)
 Patient received DM shoes at this visit with Dr Lamount  Charges added  Matthew Ferguson

## 2024-08-10 DIAGNOSIS — N184 Chronic kidney disease, stage 4 (severe): Secondary | ICD-10-CM | POA: Diagnosis not present

## 2024-08-10 DIAGNOSIS — D631 Anemia in chronic kidney disease: Secondary | ICD-10-CM | POA: Diagnosis not present

## 2024-08-10 DIAGNOSIS — A0811 Acute gastroenteropathy due to Norwalk agent: Secondary | ICD-10-CM | POA: Diagnosis not present

## 2024-08-10 DIAGNOSIS — M6259 Muscle wasting and atrophy, not elsewhere classified, multiple sites: Secondary | ICD-10-CM | POA: Diagnosis not present

## 2024-08-10 DIAGNOSIS — I129 Hypertensive chronic kidney disease with stage 1 through stage 4 chronic kidney disease, or unspecified chronic kidney disease: Secondary | ICD-10-CM | POA: Diagnosis not present

## 2024-08-10 DIAGNOSIS — E1122 Type 2 diabetes mellitus with diabetic chronic kidney disease: Secondary | ICD-10-CM | POA: Diagnosis not present

## 2024-08-12 ENCOUNTER — Other Ambulatory Visit: Payer: Self-pay | Admitting: Cardiology

## 2024-08-12 ENCOUNTER — Other Ambulatory Visit: Payer: Self-pay | Admitting: Internal Medicine

## 2024-08-16 DIAGNOSIS — N184 Chronic kidney disease, stage 4 (severe): Secondary | ICD-10-CM | POA: Diagnosis not present

## 2024-08-16 DIAGNOSIS — M6259 Muscle wasting and atrophy, not elsewhere classified, multiple sites: Secondary | ICD-10-CM | POA: Diagnosis not present

## 2024-08-16 DIAGNOSIS — D631 Anemia in chronic kidney disease: Secondary | ICD-10-CM | POA: Diagnosis not present

## 2024-08-16 DIAGNOSIS — A0811 Acute gastroenteropathy due to Norwalk agent: Secondary | ICD-10-CM | POA: Diagnosis not present

## 2024-08-16 DIAGNOSIS — I129 Hypertensive chronic kidney disease with stage 1 through stage 4 chronic kidney disease, or unspecified chronic kidney disease: Secondary | ICD-10-CM | POA: Diagnosis not present

## 2024-08-16 DIAGNOSIS — E1122 Type 2 diabetes mellitus with diabetic chronic kidney disease: Secondary | ICD-10-CM | POA: Diagnosis not present

## 2024-08-18 DIAGNOSIS — E1122 Type 2 diabetes mellitus with diabetic chronic kidney disease: Secondary | ICD-10-CM | POA: Diagnosis not present

## 2024-08-18 DIAGNOSIS — M6259 Muscle wasting and atrophy, not elsewhere classified, multiple sites: Secondary | ICD-10-CM | POA: Diagnosis not present

## 2024-08-18 DIAGNOSIS — A0811 Acute gastroenteropathy due to Norwalk agent: Secondary | ICD-10-CM | POA: Diagnosis not present

## 2024-08-18 DIAGNOSIS — D631 Anemia in chronic kidney disease: Secondary | ICD-10-CM | POA: Diagnosis not present

## 2024-08-18 DIAGNOSIS — I129 Hypertensive chronic kidney disease with stage 1 through stage 4 chronic kidney disease, or unspecified chronic kidney disease: Secondary | ICD-10-CM | POA: Diagnosis not present

## 2024-08-18 DIAGNOSIS — N184 Chronic kidney disease, stage 4 (severe): Secondary | ICD-10-CM | POA: Diagnosis not present

## 2024-08-22 DIAGNOSIS — A0811 Acute gastroenteropathy due to Norwalk agent: Secondary | ICD-10-CM | POA: Diagnosis not present

## 2024-08-22 DIAGNOSIS — I129 Hypertensive chronic kidney disease with stage 1 through stage 4 chronic kidney disease, or unspecified chronic kidney disease: Secondary | ICD-10-CM | POA: Diagnosis not present

## 2024-08-22 DIAGNOSIS — E1122 Type 2 diabetes mellitus with diabetic chronic kidney disease: Secondary | ICD-10-CM | POA: Diagnosis not present

## 2024-08-22 DIAGNOSIS — M6259 Muscle wasting and atrophy, not elsewhere classified, multiple sites: Secondary | ICD-10-CM | POA: Diagnosis not present

## 2024-08-22 DIAGNOSIS — D631 Anemia in chronic kidney disease: Secondary | ICD-10-CM | POA: Diagnosis not present

## 2024-08-22 DIAGNOSIS — N184 Chronic kidney disease, stage 4 (severe): Secondary | ICD-10-CM | POA: Diagnosis not present

## 2024-08-25 DIAGNOSIS — N184 Chronic kidney disease, stage 4 (severe): Secondary | ICD-10-CM | POA: Diagnosis not present

## 2024-08-25 DIAGNOSIS — A0811 Acute gastroenteropathy due to Norwalk agent: Secondary | ICD-10-CM | POA: Diagnosis not present

## 2024-08-25 DIAGNOSIS — E1122 Type 2 diabetes mellitus with diabetic chronic kidney disease: Secondary | ICD-10-CM | POA: Diagnosis not present

## 2024-08-25 DIAGNOSIS — I129 Hypertensive chronic kidney disease with stage 1 through stage 4 chronic kidney disease, or unspecified chronic kidney disease: Secondary | ICD-10-CM | POA: Diagnosis not present

## 2024-08-25 DIAGNOSIS — M6259 Muscle wasting and atrophy, not elsewhere classified, multiple sites: Secondary | ICD-10-CM | POA: Diagnosis not present

## 2024-08-25 DIAGNOSIS — D631 Anemia in chronic kidney disease: Secondary | ICD-10-CM | POA: Diagnosis not present

## 2024-08-26 DIAGNOSIS — Z23 Encounter for immunization: Secondary | ICD-10-CM | POA: Diagnosis not present

## 2024-08-28 DIAGNOSIS — A0811 Acute gastroenteropathy due to Norwalk agent: Secondary | ICD-10-CM | POA: Diagnosis not present

## 2024-08-28 DIAGNOSIS — I129 Hypertensive chronic kidney disease with stage 1 through stage 4 chronic kidney disease, or unspecified chronic kidney disease: Secondary | ICD-10-CM | POA: Diagnosis not present

## 2024-08-28 DIAGNOSIS — S71101A Unspecified open wound, right thigh, initial encounter: Secondary | ICD-10-CM | POA: Diagnosis not present

## 2024-08-28 DIAGNOSIS — M79651 Pain in right thigh: Secondary | ICD-10-CM | POA: Diagnosis not present

## 2024-08-28 DIAGNOSIS — R1311 Dysphagia, oral phase: Secondary | ICD-10-CM | POA: Diagnosis not present

## 2024-08-28 DIAGNOSIS — D631 Anemia in chronic kidney disease: Secondary | ICD-10-CM | POA: Diagnosis not present

## 2024-08-28 DIAGNOSIS — M6259 Muscle wasting and atrophy, not elsewhere classified, multiple sites: Secondary | ICD-10-CM | POA: Diagnosis not present

## 2024-08-28 DIAGNOSIS — Z794 Long term (current) use of insulin: Secondary | ICD-10-CM | POA: Diagnosis not present

## 2024-08-28 DIAGNOSIS — B182 Chronic viral hepatitis C: Secondary | ICD-10-CM | POA: Diagnosis not present

## 2024-08-28 DIAGNOSIS — E785 Hyperlipidemia, unspecified: Secondary | ICD-10-CM | POA: Diagnosis not present

## 2024-08-28 DIAGNOSIS — Z9181 History of falling: Secondary | ICD-10-CM | POA: Diagnosis not present

## 2024-08-28 DIAGNOSIS — I2510 Atherosclerotic heart disease of native coronary artery without angina pectoris: Secondary | ICD-10-CM | POA: Diagnosis not present

## 2024-08-28 DIAGNOSIS — R5381 Other malaise: Secondary | ICD-10-CM | POA: Diagnosis not present

## 2024-08-28 DIAGNOSIS — E1122 Type 2 diabetes mellitus with diabetic chronic kidney disease: Secondary | ICD-10-CM | POA: Diagnosis not present

## 2024-08-28 DIAGNOSIS — I700 Atherosclerosis of aorta: Secondary | ICD-10-CM | POA: Diagnosis not present

## 2024-08-28 DIAGNOSIS — D692 Other nonthrombocytopenic purpura: Secondary | ICD-10-CM | POA: Diagnosis not present

## 2024-08-28 DIAGNOSIS — E46 Unspecified protein-calorie malnutrition: Secondary | ICD-10-CM | POA: Diagnosis not present

## 2024-08-28 DIAGNOSIS — R609 Edema, unspecified: Secondary | ICD-10-CM | POA: Diagnosis not present

## 2024-08-28 DIAGNOSIS — K7460 Unspecified cirrhosis of liver: Secondary | ICD-10-CM | POA: Diagnosis not present

## 2024-08-28 DIAGNOSIS — N184 Chronic kidney disease, stage 4 (severe): Secondary | ICD-10-CM | POA: Diagnosis not present

## 2024-08-29 DIAGNOSIS — I129 Hypertensive chronic kidney disease with stage 1 through stage 4 chronic kidney disease, or unspecified chronic kidney disease: Secondary | ICD-10-CM | POA: Diagnosis not present

## 2024-08-29 DIAGNOSIS — D631 Anemia in chronic kidney disease: Secondary | ICD-10-CM | POA: Diagnosis not present

## 2024-08-29 DIAGNOSIS — A0811 Acute gastroenteropathy due to Norwalk agent: Secondary | ICD-10-CM | POA: Diagnosis not present

## 2024-08-29 DIAGNOSIS — N184 Chronic kidney disease, stage 4 (severe): Secondary | ICD-10-CM | POA: Diagnosis not present

## 2024-08-29 DIAGNOSIS — M6259 Muscle wasting and atrophy, not elsewhere classified, multiple sites: Secondary | ICD-10-CM | POA: Diagnosis not present

## 2024-08-29 DIAGNOSIS — E1122 Type 2 diabetes mellitus with diabetic chronic kidney disease: Secondary | ICD-10-CM | POA: Diagnosis not present

## 2024-08-30 DIAGNOSIS — D631 Anemia in chronic kidney disease: Secondary | ICD-10-CM | POA: Diagnosis not present

## 2024-08-30 DIAGNOSIS — A0811 Acute gastroenteropathy due to Norwalk agent: Secondary | ICD-10-CM | POA: Diagnosis not present

## 2024-08-30 DIAGNOSIS — N184 Chronic kidney disease, stage 4 (severe): Secondary | ICD-10-CM | POA: Diagnosis not present

## 2024-08-30 DIAGNOSIS — M6259 Muscle wasting and atrophy, not elsewhere classified, multiple sites: Secondary | ICD-10-CM | POA: Diagnosis not present

## 2024-08-30 DIAGNOSIS — I129 Hypertensive chronic kidney disease with stage 1 through stage 4 chronic kidney disease, or unspecified chronic kidney disease: Secondary | ICD-10-CM | POA: Diagnosis not present

## 2024-08-30 DIAGNOSIS — E1122 Type 2 diabetes mellitus with diabetic chronic kidney disease: Secondary | ICD-10-CM | POA: Diagnosis not present

## 2024-09-01 DIAGNOSIS — D631 Anemia in chronic kidney disease: Secondary | ICD-10-CM | POA: Diagnosis not present

## 2024-09-01 DIAGNOSIS — A0811 Acute gastroenteropathy due to Norwalk agent: Secondary | ICD-10-CM | POA: Diagnosis not present

## 2024-09-01 DIAGNOSIS — I129 Hypertensive chronic kidney disease with stage 1 through stage 4 chronic kidney disease, or unspecified chronic kidney disease: Secondary | ICD-10-CM | POA: Diagnosis not present

## 2024-09-01 DIAGNOSIS — E1122 Type 2 diabetes mellitus with diabetic chronic kidney disease: Secondary | ICD-10-CM | POA: Diagnosis not present

## 2024-09-01 DIAGNOSIS — N184 Chronic kidney disease, stage 4 (severe): Secondary | ICD-10-CM | POA: Diagnosis not present

## 2024-09-01 DIAGNOSIS — M6259 Muscle wasting and atrophy, not elsewhere classified, multiple sites: Secondary | ICD-10-CM | POA: Diagnosis not present

## 2024-09-05 DIAGNOSIS — A0811 Acute gastroenteropathy due to Norwalk agent: Secondary | ICD-10-CM | POA: Diagnosis not present

## 2024-09-05 DIAGNOSIS — N184 Chronic kidney disease, stage 4 (severe): Secondary | ICD-10-CM | POA: Diagnosis not present

## 2024-09-05 DIAGNOSIS — D631 Anemia in chronic kidney disease: Secondary | ICD-10-CM | POA: Diagnosis not present

## 2024-09-05 DIAGNOSIS — M6259 Muscle wasting and atrophy, not elsewhere classified, multiple sites: Secondary | ICD-10-CM | POA: Diagnosis not present

## 2024-09-05 DIAGNOSIS — I129 Hypertensive chronic kidney disease with stage 1 through stage 4 chronic kidney disease, or unspecified chronic kidney disease: Secondary | ICD-10-CM | POA: Diagnosis not present

## 2024-09-05 DIAGNOSIS — E1122 Type 2 diabetes mellitus with diabetic chronic kidney disease: Secondary | ICD-10-CM | POA: Diagnosis not present

## 2024-09-07 ENCOUNTER — Other Ambulatory Visit: Payer: Self-pay | Admitting: Interventional Radiology

## 2024-09-07 DIAGNOSIS — M6259 Muscle wasting and atrophy, not elsewhere classified, multiple sites: Secondary | ICD-10-CM | POA: Diagnosis not present

## 2024-09-07 DIAGNOSIS — N184 Chronic kidney disease, stage 4 (severe): Secondary | ICD-10-CM | POA: Diagnosis not present

## 2024-09-07 DIAGNOSIS — D631 Anemia in chronic kidney disease: Secondary | ICD-10-CM | POA: Diagnosis not present

## 2024-09-07 DIAGNOSIS — I129 Hypertensive chronic kidney disease with stage 1 through stage 4 chronic kidney disease, or unspecified chronic kidney disease: Secondary | ICD-10-CM | POA: Diagnosis not present

## 2024-09-07 DIAGNOSIS — A0811 Acute gastroenteropathy due to Norwalk agent: Secondary | ICD-10-CM | POA: Diagnosis not present

## 2024-09-07 DIAGNOSIS — K766 Portal hypertension: Secondary | ICD-10-CM

## 2024-09-07 DIAGNOSIS — E1122 Type 2 diabetes mellitus with diabetic chronic kidney disease: Secondary | ICD-10-CM | POA: Diagnosis not present

## 2024-09-08 DIAGNOSIS — I129 Hypertensive chronic kidney disease with stage 1 through stage 4 chronic kidney disease, or unspecified chronic kidney disease: Secondary | ICD-10-CM | POA: Diagnosis not present

## 2024-09-08 DIAGNOSIS — E1122 Type 2 diabetes mellitus with diabetic chronic kidney disease: Secondary | ICD-10-CM | POA: Diagnosis not present

## 2024-09-08 DIAGNOSIS — N184 Chronic kidney disease, stage 4 (severe): Secondary | ICD-10-CM | POA: Diagnosis not present

## 2024-09-08 DIAGNOSIS — D631 Anemia in chronic kidney disease: Secondary | ICD-10-CM | POA: Diagnosis not present

## 2024-09-08 DIAGNOSIS — M6259 Muscle wasting and atrophy, not elsewhere classified, multiple sites: Secondary | ICD-10-CM | POA: Diagnosis not present

## 2024-09-08 DIAGNOSIS — A0811 Acute gastroenteropathy due to Norwalk agent: Secondary | ICD-10-CM | POA: Diagnosis not present

## 2024-09-12 DIAGNOSIS — D631 Anemia in chronic kidney disease: Secondary | ICD-10-CM | POA: Diagnosis not present

## 2024-09-12 DIAGNOSIS — E1122 Type 2 diabetes mellitus with diabetic chronic kidney disease: Secondary | ICD-10-CM | POA: Diagnosis not present

## 2024-09-12 DIAGNOSIS — I129 Hypertensive chronic kidney disease with stage 1 through stage 4 chronic kidney disease, or unspecified chronic kidney disease: Secondary | ICD-10-CM | POA: Diagnosis not present

## 2024-09-12 DIAGNOSIS — A0811 Acute gastroenteropathy due to Norwalk agent: Secondary | ICD-10-CM | POA: Diagnosis not present

## 2024-09-12 DIAGNOSIS — N184 Chronic kidney disease, stage 4 (severe): Secondary | ICD-10-CM | POA: Diagnosis not present

## 2024-09-12 DIAGNOSIS — M6259 Muscle wasting and atrophy, not elsewhere classified, multiple sites: Secondary | ICD-10-CM | POA: Diagnosis not present

## 2024-09-13 ENCOUNTER — Other Ambulatory Visit: Payer: Self-pay

## 2024-09-13 ENCOUNTER — Telehealth: Payer: Self-pay | Admitting: Gastroenterology

## 2024-09-13 DIAGNOSIS — A0811 Acute gastroenteropathy due to Norwalk agent: Secondary | ICD-10-CM | POA: Diagnosis not present

## 2024-09-13 DIAGNOSIS — E1122 Type 2 diabetes mellitus with diabetic chronic kidney disease: Secondary | ICD-10-CM | POA: Diagnosis not present

## 2024-09-13 DIAGNOSIS — N184 Chronic kidney disease, stage 4 (severe): Secondary | ICD-10-CM | POA: Diagnosis not present

## 2024-09-13 DIAGNOSIS — D631 Anemia in chronic kidney disease: Secondary | ICD-10-CM | POA: Diagnosis not present

## 2024-09-13 DIAGNOSIS — M6259 Muscle wasting and atrophy, not elsewhere classified, multiple sites: Secondary | ICD-10-CM | POA: Diagnosis not present

## 2024-09-13 DIAGNOSIS — I129 Hypertensive chronic kidney disease with stage 1 through stage 4 chronic kidney disease, or unspecified chronic kidney disease: Secondary | ICD-10-CM | POA: Diagnosis not present

## 2024-09-13 NOTE — Telephone Encounter (Signed)
 Yes, it can be refilled with current dosing instructions because it is for his cirrhosis-related hepatic encephalopathy.  He is also long overdue for clinic follow up with me regarding his cirrhosis  - H. Danis

## 2024-09-13 NOTE — Telephone Encounter (Signed)
Is refill appropriate? Please advise. 

## 2024-09-13 NOTE — Telephone Encounter (Signed)
 PT is calling to have a new prescription sent to [facility] for Xifaxin 550MG . Please advise.

## 2024-09-14 ENCOUNTER — Other Ambulatory Visit: Payer: Self-pay

## 2024-09-14 DIAGNOSIS — K7682 Hepatic encephalopathy: Secondary | ICD-10-CM

## 2024-09-14 DIAGNOSIS — K7460 Unspecified cirrhosis of liver: Secondary | ICD-10-CM

## 2024-09-14 MED ORDER — RIFAXIMIN 550 MG PO TABS: 550.0000 mg | ORAL_TABLET | Freq: Two times a day (BID) | ORAL | 3 refills | Status: AC

## 2024-09-14 NOTE — Telephone Encounter (Signed)
 Rx sent to pt preferred pharmacy and pt informed via VM.

## 2024-09-16 DIAGNOSIS — A0811 Acute gastroenteropathy due to Norwalk agent: Secondary | ICD-10-CM | POA: Diagnosis not present

## 2024-09-16 DIAGNOSIS — I129 Hypertensive chronic kidney disease with stage 1 through stage 4 chronic kidney disease, or unspecified chronic kidney disease: Secondary | ICD-10-CM | POA: Diagnosis not present

## 2024-09-16 DIAGNOSIS — M6259 Muscle wasting and atrophy, not elsewhere classified, multiple sites: Secondary | ICD-10-CM | POA: Diagnosis not present

## 2024-09-16 DIAGNOSIS — E1122 Type 2 diabetes mellitus with diabetic chronic kidney disease: Secondary | ICD-10-CM | POA: Diagnosis not present

## 2024-09-16 DIAGNOSIS — N184 Chronic kidney disease, stage 4 (severe): Secondary | ICD-10-CM | POA: Diagnosis not present

## 2024-09-16 DIAGNOSIS — D631 Anemia in chronic kidney disease: Secondary | ICD-10-CM | POA: Diagnosis not present

## 2024-09-20 ENCOUNTER — Ambulatory Visit: Admitting: Podiatry

## 2024-09-20 ENCOUNTER — Encounter: Payer: Self-pay | Admitting: Podiatry

## 2024-09-20 DIAGNOSIS — M79674 Pain in right toe(s): Secondary | ICD-10-CM

## 2024-09-20 DIAGNOSIS — M79675 Pain in left toe(s): Secondary | ICD-10-CM | POA: Diagnosis not present

## 2024-09-20 DIAGNOSIS — E1140 Type 2 diabetes mellitus with diabetic neuropathy, unspecified: Secondary | ICD-10-CM | POA: Diagnosis not present

## 2024-09-20 DIAGNOSIS — Z658 Other specified problems related to psychosocial circumstances: Secondary | ICD-10-CM | POA: Insufficient documentation

## 2024-09-20 DIAGNOSIS — B351 Tinea unguium: Secondary | ICD-10-CM | POA: Diagnosis not present

## 2024-09-20 NOTE — Progress Notes (Signed)
  Subjective:  Patient ID: Matthew Ferguson, male    DOB: [birth year],  MRN: [id number]  Chief Complaint  Patient presents with   Nail Problem    Thick painful toenails, 3 month follow up    Diabetes   Peripheral Neuropathy    78 y.o. male presents with the above complaint. History confirmed with patient. Patient presenting with pain related to dystrophic thickened elongated nails. Patient is unable to trim own nails related to nail dystrophy and/or mobility issues. Patient does have a history of T2DM.  Tenderness on palpation to bilateral first toenails medial borders left greater than right  Objective:  Physical Exam: warm, good capillary refill.  Pedal hair growth absent.  Pedal skin atrophic.  Dry, xerotic, flaky pedal skin. nail exam onychomycosis of the toenails, onycholysis, and dystrophic nails, some incurvation and tenderness to bilateral first toes medial borders. DP pulses palpable, PT pulses faintly palpable, and protective sensation absent, vibratory sensation absent Left Foot:  Pain with palpation of nails due to elongation and dystrophic growth.  Right Foot: Pain with palpation of nails due to elongation and dystrophic growth.  Bilateral pes planus foot type with muscle strength 4/5 in dorsiflexion, plantarflexion, inversion, eversion.  Presenting in wheelchair today. Assessment:   1. Pain due to onychomycosis of toenails of both feet   2. Type 2 diabetes mellitus with diabetic neuropathy, without long-term current use of insulin  (HCC)     Plan:  Patient was evaluated and treated and all questions answered.   #Onychomycosis with pain  -Nails palliatively debrided as below. -Educated on self-care - Left hallux medial border slant back nail trim performed today - Follow-up as needed if this recurs or if there is concern for infection - Apply antibiotic ointment and bandage to affected area for approximately 1 week.  Procedure: Nail Debridement Rationale: Pain Type of  Debridement: manual, sharp debridement. Instrumentation: Nail nipper, rotary burr. Number of Nails: 10   # Diabetes with neuropathy -Patient educated on diabetes. Discussed proper diabetic foot care and discussed risks and complications of disease. Educated patient in depth on reasons to return to the office immediately should he/she discover anything concerning or new on the feet. All questions answered. Discussed proper shoes as well.    Return in about 3 months (around 12/21/2024) for Diabetic Foot Care.         Ethan Saddler, DPM [facility] / [facility]

## 2024-09-21 DIAGNOSIS — I129 Hypertensive chronic kidney disease with stage 1 through stage 4 chronic kidney disease, or unspecified chronic kidney disease: Secondary | ICD-10-CM | POA: Diagnosis not present

## 2024-09-21 DIAGNOSIS — Z23 Encounter for immunization: Secondary | ICD-10-CM | POA: Diagnosis not present

## 2024-09-21 DIAGNOSIS — D631 Anemia in chronic kidney disease: Secondary | ICD-10-CM | POA: Diagnosis not present

## 2024-09-21 DIAGNOSIS — A0811 Acute gastroenteropathy due to Norwalk agent: Secondary | ICD-10-CM | POA: Diagnosis not present

## 2024-09-21 DIAGNOSIS — E1122 Type 2 diabetes mellitus with diabetic chronic kidney disease: Secondary | ICD-10-CM | POA: Diagnosis not present

## 2024-09-21 DIAGNOSIS — N184 Chronic kidney disease, stage 4 (severe): Secondary | ICD-10-CM | POA: Diagnosis not present

## 2024-09-21 DIAGNOSIS — M6259 Muscle wasting and atrophy, not elsewhere classified, multiple sites: Secondary | ICD-10-CM | POA: Diagnosis not present

## 2024-09-22 DIAGNOSIS — I129 Hypertensive chronic kidney disease with stage 1 through stage 4 chronic kidney disease, or unspecified chronic kidney disease: Secondary | ICD-10-CM | POA: Diagnosis not present

## 2024-09-22 DIAGNOSIS — M6259 Muscle wasting and atrophy, not elsewhere classified, multiple sites: Secondary | ICD-10-CM | POA: Diagnosis not present

## 2024-09-22 DIAGNOSIS — N184 Chronic kidney disease, stage 4 (severe): Secondary | ICD-10-CM | POA: Diagnosis not present

## 2024-09-22 DIAGNOSIS — D631 Anemia in chronic kidney disease: Secondary | ICD-10-CM | POA: Diagnosis not present

## 2024-09-22 DIAGNOSIS — E1122 Type 2 diabetes mellitus with diabetic chronic kidney disease: Secondary | ICD-10-CM | POA: Diagnosis not present

## 2024-09-22 DIAGNOSIS — A0811 Acute gastroenteropathy due to Norwalk agent: Secondary | ICD-10-CM | POA: Diagnosis not present

## 2024-11-10 ENCOUNTER — Other Ambulatory Visit: Payer: Self-pay | Admitting: Internal Medicine

## 2024-11-14 ENCOUNTER — Other Ambulatory Visit: Payer: Self-pay | Admitting: Internal Medicine

## 2024-11-16 ENCOUNTER — Ambulatory Visit: Admitting: Internal Medicine

## 2024-11-16 ENCOUNTER — Encounter: Payer: Self-pay | Admitting: Internal Medicine

## 2024-11-16 ENCOUNTER — Other Ambulatory Visit: Payer: Self-pay | Admitting: Internal Medicine

## 2024-11-16 VITALS — BP 110/68 | HR 58 | Temp 97.8°F | Resp 18 | Ht 69.0 in | Wt 194.1 lb

## 2024-11-16 DIAGNOSIS — E1140 Type 2 diabetes mellitus with diabetic neuropathy, unspecified: Secondary | ICD-10-CM

## 2024-11-16 DIAGNOSIS — I25118 Atherosclerotic heart disease of native coronary artery with other forms of angina pectoris: Secondary | ICD-10-CM

## 2024-11-16 DIAGNOSIS — F3341 Major depressive disorder, recurrent, in partial remission: Secondary | ICD-10-CM | POA: Diagnosis not present

## 2024-11-16 DIAGNOSIS — E782 Mixed hyperlipidemia: Secondary | ICD-10-CM | POA: Diagnosis not present

## 2024-11-16 DIAGNOSIS — I10 Essential (primary) hypertension: Secondary | ICD-10-CM

## 2024-11-16 DIAGNOSIS — K7460 Unspecified cirrhosis of liver: Secondary | ICD-10-CM | POA: Diagnosis not present

## 2024-11-16 DIAGNOSIS — Z794 Long term (current) use of insulin: Secondary | ICD-10-CM

## 2024-11-16 MED ORDER — ROSUVASTATIN CALCIUM 10 MG PO TABS: 10.0000 mg | ORAL_TABLET | Freq: Every day | ORAL | 4 refills | Status: DC

## 2024-11-16 MED ORDER — SERTRALINE HCL 50 MG PO TABS: 50.0000 mg | ORAL_TABLET | Freq: Every day | ORAL | 4 refills | Status: DC

## 2024-11-16 MED ORDER — ROSUVASTATIN CALCIUM 10 MG PO TABS: 10.0000 mg | ORAL_TABLET | Freq: Every day | ORAL | 4 refills | Status: AC

## 2024-11-16 MED ORDER — SERTRALINE HCL 50 MG PO TABS: 25.0000 mg | ORAL_TABLET | Freq: Every day | ORAL | 6 refills | Status: DC

## 2024-11-16 NOTE — Progress Notes (Signed)
 "  Office Visit  Subjective   Patient ID: Matthew Ferguson   DOB: 1945/11/26   Age: 79 y.o.   MRN: [id number]   Chief Complaint Chief Complaint  Patient presents with   Follow-up    Follow up     History of Present Illness 79 years old male is here for follow up with his wife and caregiver.  He has multiple medical problem including stage 4 chronic kidney disease not on dialysis, cirrhosis of liver with ascites and is hepatic encephalopathy has been stable.  For that he follows with gastroenterologist.  He also follow with nephrologist but his kidney function is stable.  He has been under hospice care.  He is here complaining of depression and pain.  He has been getting sertraline  25 mg daily, I have discussed different option with him and he wanted to increase the dose of sertraline  to 50 mg daily to see if that helps.     His family also worried that he has memory is getting worse.  And I have discussed with them that when you are depressed it is difficult to evaluate, he may very well have memory problem as well.  He says he takes lantus  24 units in the morning and  He is also getting mealtime coverage.  His hemoglobin A1c 1 year ago was 7.6.  No hypoglycemia was noted. He has stage CKD-IIIb to CKD IV. His GFR was 38 on 11/18/2023. He follows with nephrologist.    He has cirrhosis of liver with ascites.   He says that his ascites are better.    He has dyslipidemia and his LDL was 122 in 11/18/2023.     Past Medical History Past Medical History:  Diagnosis Date   Acute blood loss anemia 04/01/2021   Ascites    Bilateral leg edema 12/26/2022   Chest pain at rest 03/13/2022   Chronic back pain 12/11/2021   Chronic kidney disease    Cirrhosis (HCC)    Cirrhosis of liver (HCC) 12/30/2021   Complication of anesthesia    Post operative vomiting for 2 weeks per pt after cholecystectomy   Coronary artery disease involving native coronary artery 05/29/2015   05/08/12 had PCI and Xience  stent to RCA for Troponin normal unstable angina with residual 40-50% proximal and 30-40 % mid LAD stenosis, EF normal 55-60%   Depression    Diverticulosis of colon without hemorrhage    Essential hypertension 05/29/2015   GERD (gastroesophageal reflux disease)    Hearing loss 12/11/2021   Hepatic encephalopathy (HCC) 12/26/2022   Hepatitis-C 03/31/2021   undetectable virus on 03/31/2021.     Irritable bowel syndrome    Mixed hyperlipidemia 05/29/2015   Palpitations    Polyuria 06/01/2023   PONV (postoperative nausea and vomiting)    Pre-op evaluation 09/09/2019   Rectal bleeding    S/P left knee arthroscopy 08/10/2018   S/P TIPS (transjugular intrahepatic portosystemic shunt) 03/17/2022   Sensorineural hearing loss, bilateral 12/11/2021   Symptomatic anemia 03/31/2021   Tinnitus    Type 2 diabetes mellitus (HCC) 05/29/2015   Type 2 diabetes mellitus with diabetic neuropathy, unspecified (HCC) 12/11/2021   Type 2 diabetes mellitus with hyperglycemia (HCC) 06/01/2023   Unsteady gait when walking 08/21/2023     Allergies Allergies[1]   Review of Systems Review of Systems  Constitutional: Negative.   Respiratory: Negative.    Cardiovascular: Negative.   Neurological:  Positive for weakness.  Psychiatric/Behavioral:  Positive for depression and memory loss.  Objective:    Vitals BP 110/68   Pulse (!) 58   Temp 97.8 F (36.6 C)   Resp 18   Ht 5' 9 (1.753 m)   Wt 194 lb 2 oz (88.1 kg)   SpO2 97%   BMI 28.67 kg/m    Physical Examination Physical Exam Constitutional:      Appearance: Normal appearance.  Cardiovascular:     Rate and Rhythm: Normal rate and regular rhythm.     Heart sounds: Normal heart sounds.  Pulmonary:     Effort: Pulmonary effort is normal.     Breath sounds: Normal breath sounds.  Abdominal:     General: Bowel sounds are normal.     Palpations: Abdomen is soft.  Neurological:     Mental Status: He is alert.     Comments:   He is  generally weak and does not ambulate.  He grossly has no focal sign and his conversation make sense.        Assessment & Plan:   Essential hypertension   His blood pressure is controlled.  Coronary artery disease involving native coronary artery   He denies any chest pain.  Cirrhosis of liver (HCC)   He do not have ascites and no recent hepatic encephalopathy either.  Type 2 diabetes mellitus with diabetic neuropathy, unspecified (HCC)   His blood sugar year ago was stable and I will do hemoglobin A1c and then will re-evaluate.  Mixed hyperlipidemia   He takes rosuvastatin  10 mg daily.  I will do lipid panel.  Depression   He admitted that he is depressed.  He moves around on motorized wheelchair and does not do much at home either.  I will increase the dose of sertraline  to 50 mg daily.    Return in about 1 month (around 12/17/2024).   Roetta Dare, MD      [1] No Known Allergies  "

## 2024-11-17 LAB — CMP14 + ANION GAP
ALT: 26 IU/L (ref 0–44)
AST: 33 IU/L (ref 0–40)
Albumin: 3.3 g/dL — ABNORMAL LOW (ref 3.8–4.8)
Alkaline Phosphatase: 190 IU/L — ABNORMAL HIGH (ref 47–123)
Anion Gap: 13 mmol/L (ref 10.0–18.0)
BUN/Creatinine Ratio: 13 (ref 10–24)
BUN: 28 mg/dL — ABNORMAL HIGH (ref 8–27)
Bilirubin Total: 1.6 mg/dL — ABNORMAL HIGH (ref 0.0–1.2)
CO2: 22 mmol/L (ref 20–29)
Calcium: 9.2 mg/dL (ref 8.6–10.2)
Chloride: 95 mmol/L — ABNORMAL LOW (ref 96–106)
Creatinine, Ser: 2.17 mg/dL — ABNORMAL HIGH (ref 0.76–1.27)
Globulin, Total: 2.9 g/dL (ref 1.5–4.5)
Glucose: 503 mg/dL (ref 70–99)
Potassium: 5.1 mmol/L (ref 3.5–5.2)
Sodium: 130 mmol/L — ABNORMAL LOW (ref 134–144)
Total Protein: 6.2 g/dL (ref 6.0–8.5)
eGFR: 30 mL/min/1.73 — ABNORMAL LOW

## 2024-11-17 LAB — LIPID PANEL
Chol/HDL Ratio: 2.6 ratio (ref 0.0–5.0)
Cholesterol, Total: 150 mg/dL (ref 100–199)
HDL: 57 mg/dL
LDL Chol Calc (NIH): 64 mg/dL (ref 0–99)
Triglycerides: 171 mg/dL — ABNORMAL HIGH (ref 0–149)
VLDL Cholesterol Cal: 29 mg/dL (ref 5–40)

## 2024-11-17 LAB — HEMOGLOBIN A1C
Est. average glucose Bld gHb Est-mCnc: 183 mg/dL
Hgb A1c MFr Bld: 8 % — ABNORMAL HIGH (ref 4.8–5.6)

## 2024-11-20 NOTE — Assessment & Plan Note (Signed)
"    His blood sugar year ago was stable and I will do hemoglobin A1c and then will re-evaluate. "

## 2024-11-20 NOTE — Assessment & Plan Note (Signed)
 He denies any chest pain

## 2024-11-20 NOTE — Assessment & Plan Note (Signed)
"    He takes rosuvastatin  10 mg daily.  I will do lipid panel. "

## 2024-11-20 NOTE — Assessment & Plan Note (Signed)
 His blood pressure is controlled.

## 2024-11-20 NOTE — Assessment & Plan Note (Signed)
"    He admitted that he is depressed.  He moves around on motorized wheelchair and does not do much at home either.  I will increase the dose of sertraline  to 50 mg daily. "

## 2024-11-20 NOTE — Assessment & Plan Note (Signed)
"    He do not have ascites and no recent hepatic encephalopathy either. "

## 2024-11-22 ENCOUNTER — Other Ambulatory Visit: Payer: Self-pay

## 2024-11-22 MED ORDER — SERTRALINE HCL 50 MG PO TABS: 50.0000 mg | ORAL_TABLET | Freq: Every day | ORAL | 4 refills | Status: AC

## 2024-12-15 ENCOUNTER — Ambulatory Visit: Admitting: Internal Medicine

## 2024-12-21 ENCOUNTER — Ambulatory Visit: Admitting: Podiatry
